# Patient Record
Sex: Female | Born: 1935 | Race: White | Hispanic: No | Marital: Married | Smoking: Former smoker
Health system: Southern US, Community
[De-identification: ages and names within clinical notes are randomized; demographics above are authoritative.]

## PROBLEM LIST (undated history)

## (undated) DIAGNOSIS — G35 Multiple sclerosis: Secondary | ICD-10-CM

## (undated) DIAGNOSIS — I5032 Chronic diastolic (congestive) heart failure: Secondary | ICD-10-CM

## (undated) DIAGNOSIS — K219 Gastro-esophageal reflux disease without esophagitis: Secondary | ICD-10-CM

## (undated) DIAGNOSIS — K5190 Ulcerative colitis, unspecified, without complications: Secondary | ICD-10-CM

## (undated) DIAGNOSIS — G709 Myoneural disorder, unspecified: Secondary | ICD-10-CM

## (undated) DIAGNOSIS — K529 Noninfective gastroenteritis and colitis, unspecified: Secondary | ICD-10-CM

## (undated) DIAGNOSIS — E785 Hyperlipidemia, unspecified: Secondary | ICD-10-CM

## (undated) DIAGNOSIS — E1165 Type 2 diabetes mellitus with hyperglycemia: Secondary | ICD-10-CM

## (undated) DIAGNOSIS — J189 Pneumonia, unspecified organism: Secondary | ICD-10-CM

## (undated) DIAGNOSIS — F419 Anxiety disorder, unspecified: Secondary | ICD-10-CM

## (undated) DIAGNOSIS — K922 Gastrointestinal hemorrhage, unspecified: Secondary | ICD-10-CM

## (undated) DIAGNOSIS — I10 Essential (primary) hypertension: Secondary | ICD-10-CM

## (undated) DIAGNOSIS — I214 Non-ST elevation (NSTEMI) myocardial infarction: Secondary | ICD-10-CM

## (undated) DIAGNOSIS — R262 Difficulty in walking, not elsewhere classified: Secondary | ICD-10-CM

## (undated) HISTORY — PX: TUBAL LIGATION: SHX77

## (undated) HISTORY — DX: Ulcerative colitis, unspecified, without complications: K51.90

## (undated) HISTORY — DX: Type 2 diabetes mellitus with hyperglycemia: E11.65

## (undated) HISTORY — DX: Gastrointestinal hemorrhage, unspecified: K92.2

## (undated) SURGERY — EGD (ESOPHAGOGASTRODUODENOSCOPY)
Anesthesia: Moderate Sedation

## (undated) SURGERY — COLONOSCOPY
Anesthesia: Moderate Sedation

---

## 1997-10-08 ENCOUNTER — Other Ambulatory Visit: Admission: RE | Admit: 1997-10-08 | Discharge: 1997-10-08 | Payer: Self-pay | Admitting: Gastroenterology

## 1998-01-19 ENCOUNTER — Other Ambulatory Visit: Admission: RE | Admit: 1998-01-19 | Discharge: 1998-01-19 | Payer: Self-pay | Admitting: Internal Medicine

## 1998-10-05 ENCOUNTER — Other Ambulatory Visit: Admission: RE | Admit: 1998-10-05 | Discharge: 1998-10-05 | Payer: Self-pay | Admitting: Obstetrics and Gynecology

## 2000-03-10 ENCOUNTER — Encounter: Admission: RE | Admit: 2000-03-10 | Discharge: 2000-03-10 | Payer: Self-pay | Admitting: Obstetrics and Gynecology

## 2000-03-10 ENCOUNTER — Encounter: Payer: Self-pay | Admitting: Obstetrics and Gynecology

## 2000-03-29 ENCOUNTER — Other Ambulatory Visit: Admission: RE | Admit: 2000-03-29 | Discharge: 2000-03-29 | Payer: Self-pay | Admitting: Obstetrics and Gynecology

## 2000-10-27 ENCOUNTER — Encounter: Payer: Self-pay | Admitting: Internal Medicine

## 2000-10-27 ENCOUNTER — Ambulatory Visit (HOSPITAL_COMMUNITY): Admission: RE | Admit: 2000-10-27 | Discharge: 2000-10-27 | Payer: Self-pay | Admitting: Internal Medicine

## 2001-08-21 ENCOUNTER — Encounter: Admission: RE | Admit: 2001-08-21 | Discharge: 2001-08-21 | Payer: Self-pay | Admitting: Obstetrics and Gynecology

## 2001-08-21 ENCOUNTER — Encounter: Payer: Self-pay | Admitting: Obstetrics and Gynecology

## 2001-10-16 ENCOUNTER — Ambulatory Visit (HOSPITAL_COMMUNITY): Admission: RE | Admit: 2001-10-16 | Discharge: 2001-10-16 | Payer: Self-pay | Admitting: Gastroenterology

## 2001-10-16 ENCOUNTER — Encounter (INDEPENDENT_AMBULATORY_CARE_PROVIDER_SITE_OTHER): Payer: Self-pay | Admitting: *Deleted

## 2001-10-31 ENCOUNTER — Other Ambulatory Visit: Admission: RE | Admit: 2001-10-31 | Discharge: 2001-10-31 | Payer: Self-pay | Admitting: Obstetrics and Gynecology

## 2002-08-20 ENCOUNTER — Encounter: Admission: RE | Admit: 2002-08-20 | Discharge: 2002-08-20 | Payer: Self-pay | Admitting: Obstetrics and Gynecology

## 2002-08-20 ENCOUNTER — Encounter: Payer: Self-pay | Admitting: Obstetrics and Gynecology

## 2003-03-18 ENCOUNTER — Encounter: Admission: RE | Admit: 2003-03-18 | Discharge: 2003-03-18 | Payer: Self-pay | Admitting: Neurology

## 2003-03-18 ENCOUNTER — Encounter: Payer: Self-pay | Admitting: Neurology

## 2003-04-02 ENCOUNTER — Encounter: Admission: RE | Admit: 2003-04-02 | Discharge: 2003-05-13 | Payer: Self-pay | Admitting: Neurology

## 2004-02-02 ENCOUNTER — Ambulatory Visit (HOSPITAL_COMMUNITY): Admission: RE | Admit: 2004-02-02 | Discharge: 2004-02-02 | Payer: Self-pay | Admitting: Obstetrics and Gynecology

## 2004-07-07 ENCOUNTER — Inpatient Hospital Stay (HOSPITAL_COMMUNITY): Admission: AD | Admit: 2004-07-07 | Discharge: 2004-07-12 | Payer: Self-pay | Admitting: Neurology

## 2004-12-28 ENCOUNTER — Encounter: Admission: RE | Admit: 2004-12-28 | Discharge: 2005-03-28 | Payer: Self-pay | Admitting: Neurology

## 2005-03-04 ENCOUNTER — Encounter: Admission: RE | Admit: 2005-03-04 | Discharge: 2005-03-04 | Payer: Self-pay | Admitting: Internal Medicine

## 2006-10-09 ENCOUNTER — Inpatient Hospital Stay (HOSPITAL_COMMUNITY): Admission: EM | Admit: 2006-10-09 | Discharge: 2006-10-20 | Payer: Self-pay | Admitting: Emergency Medicine

## 2006-10-16 ENCOUNTER — Encounter: Payer: Self-pay | Admitting: Vascular Surgery

## 2006-10-16 ENCOUNTER — Ambulatory Visit: Payer: Self-pay | Admitting: Vascular Surgery

## 2006-10-19 ENCOUNTER — Encounter: Payer: Self-pay | Admitting: Cardiology

## 2006-12-07 ENCOUNTER — Encounter (HOSPITAL_COMMUNITY): Admission: RE | Admit: 2006-12-07 | Discharge: 2006-12-08 | Payer: Self-pay | Admitting: Internal Medicine

## 2007-06-12 ENCOUNTER — Ambulatory Visit (HOSPITAL_COMMUNITY): Admission: RE | Admit: 2007-06-12 | Discharge: 2007-06-12 | Payer: Self-pay | Admitting: Neurology

## 2007-10-30 ENCOUNTER — Inpatient Hospital Stay (HOSPITAL_COMMUNITY): Admission: EM | Admit: 2007-10-30 | Discharge: 2007-11-02 | Payer: Self-pay | Admitting: Emergency Medicine

## 2008-01-01 ENCOUNTER — Inpatient Hospital Stay (HOSPITAL_COMMUNITY): Admission: EM | Admit: 2008-01-01 | Discharge: 2008-01-04 | Payer: Self-pay | Admitting: Emergency Medicine

## 2008-11-20 ENCOUNTER — Ambulatory Visit (HOSPITAL_COMMUNITY): Admission: RE | Admit: 2008-11-20 | Discharge: 2008-11-20 | Payer: Self-pay | Admitting: Internal Medicine

## 2009-09-01 ENCOUNTER — Inpatient Hospital Stay (HOSPITAL_COMMUNITY): Admission: EM | Admit: 2009-09-01 | Discharge: 2009-09-14 | Payer: Self-pay | Admitting: Emergency Medicine

## 2009-09-01 ENCOUNTER — Ambulatory Visit: Payer: Self-pay | Admitting: Pulmonary Disease

## 2009-09-01 ENCOUNTER — Ambulatory Visit: Payer: Self-pay | Admitting: Internal Medicine

## 2009-09-04 ENCOUNTER — Encounter (INDEPENDENT_AMBULATORY_CARE_PROVIDER_SITE_OTHER): Payer: Self-pay | Admitting: Pulmonary Disease

## 2009-09-05 ENCOUNTER — Ambulatory Visit: Payer: Self-pay | Admitting: Surgery

## 2009-09-05 ENCOUNTER — Encounter (INDEPENDENT_AMBULATORY_CARE_PROVIDER_SITE_OTHER): Payer: Self-pay | Admitting: Internal Medicine

## 2010-09-29 LAB — CARDIAC PANEL(CRET KIN+CKTOT+MB+TROPI)
CK, MB: 1.3 ng/mL (ref 0.3–4.0)
Relative Index: INVALID (ref 0.0–2.5)
Relative Index: INVALID (ref 0.0–2.5)
Total CK: 42 U/L (ref 7–177)
Total CK: 60 U/L (ref 7–177)
Troponin I: 0.02 ng/mL (ref 0.00–0.06)

## 2010-09-29 LAB — LEGIONELLA ANTIGEN, URINE

## 2010-09-29 LAB — GLUCOSE, CAPILLARY
Glucose-Capillary: 104 mg/dL — ABNORMAL HIGH (ref 70–99)
Glucose-Capillary: 113 mg/dL — ABNORMAL HIGH (ref 70–99)
Glucose-Capillary: 119 mg/dL — ABNORMAL HIGH (ref 70–99)
Glucose-Capillary: 120 mg/dL — ABNORMAL HIGH (ref 70–99)
Glucose-Capillary: 127 mg/dL — ABNORMAL HIGH (ref 70–99)
Glucose-Capillary: 130 mg/dL — ABNORMAL HIGH (ref 70–99)
Glucose-Capillary: 142 mg/dL — ABNORMAL HIGH (ref 70–99)
Glucose-Capillary: 142 mg/dL — ABNORMAL HIGH (ref 70–99)
Glucose-Capillary: 149 mg/dL — ABNORMAL HIGH (ref 70–99)
Glucose-Capillary: 153 mg/dL — ABNORMAL HIGH (ref 70–99)
Glucose-Capillary: 158 mg/dL — ABNORMAL HIGH (ref 70–99)
Glucose-Capillary: 218 mg/dL — ABNORMAL HIGH (ref 70–99)
Glucose-Capillary: 318 mg/dL — ABNORMAL HIGH (ref 70–99)
Glucose-Capillary: 85 mg/dL (ref 70–99)
Glucose-Capillary: 87 mg/dL (ref 70–99)
Glucose-Capillary: 91 mg/dL (ref 70–99)
Glucose-Capillary: 93 mg/dL (ref 70–99)
Glucose-Capillary: 93 mg/dL (ref 70–99)
Glucose-Capillary: 99 mg/dL (ref 70–99)

## 2010-09-29 LAB — DIFFERENTIAL
Basophils Absolute: 0.1 10*3/uL (ref 0.0–0.1)
Basophils Relative: 1 % (ref 0–1)
Eosinophils Absolute: 0 10*3/uL (ref 0.0–0.7)
Eosinophils Absolute: 0 10*3/uL (ref 0.0–0.7)
Eosinophils Relative: 0 % (ref 0–5)
Eosinophils Relative: 0 % (ref 0–5)
Lymphocytes Relative: 11 % — ABNORMAL LOW (ref 12–46)
Lymphocytes Relative: 4 % — ABNORMAL LOW (ref 12–46)
Lymphs Abs: 0.9 10*3/uL (ref 0.7–4.0)
Lymphs Abs: 2.1 10*3/uL (ref 0.7–4.0)
Monocytes Absolute: 1 10*3/uL (ref 0.1–1.0)
Monocytes Absolute: 1.6 10*3/uL — ABNORMAL HIGH (ref 0.1–1.0)
Monocytes Relative: 7 % (ref 3–12)
Monocytes Relative: 8 % (ref 3–12)
Neutrophils Relative %: 89 % — ABNORMAL HIGH (ref 43–77)

## 2010-09-29 LAB — CBC
HCT: 32.9 % — ABNORMAL LOW (ref 36.0–46.0)
HCT: 39.1 % (ref 36.0–46.0)
HCT: 43.7 % (ref 36.0–46.0)
Hemoglobin: 12.9 g/dL (ref 12.0–15.0)
Hemoglobin: 13.3 g/dL (ref 12.0–15.0)
Hemoglobin: 14 g/dL (ref 12.0–15.0)
Hemoglobin: 15 g/dL (ref 12.0–15.0)
MCHC: 33.5 g/dL (ref 30.0–36.0)
MCHC: 33.7 g/dL (ref 30.0–36.0)
MCHC: 34.3 g/dL (ref 30.0–36.0)
MCHC: 34.3 g/dL (ref 30.0–36.0)
MCV: 86.2 fL (ref 78.0–100.0)
MCV: 86.3 fL (ref 78.0–100.0)
MCV: 86.4 fL (ref 78.0–100.0)
MCV: 86.9 fL (ref 78.0–100.0)
MCV: 87.2 fL (ref 78.0–100.0)
MCV: 87.5 fL (ref 78.0–100.0)
Platelets: 192 10*3/uL (ref 150–400)
Platelets: 236 10*3/uL (ref 150–400)
Platelets: 243 10*3/uL (ref 150–400)
Platelets: 255 10*3/uL (ref 150–400)
RBC: 3.81 MIL/uL — ABNORMAL LOW (ref 3.87–5.11)
RBC: 4.41 MIL/uL (ref 3.87–5.11)
RBC: 4.43 MIL/uL (ref 3.87–5.11)
RDW: 14.1 % (ref 11.5–15.5)
RDW: 14.4 % (ref 11.5–15.5)
RDW: 14.4 % (ref 11.5–15.5)
WBC: 11.9 10*3/uL — ABNORMAL HIGH (ref 4.0–10.5)
WBC: 18.8 10*3/uL — ABNORMAL HIGH (ref 4.0–10.5)
WBC: 8.2 10*3/uL (ref 4.0–10.5)
WBC: 9.1 10*3/uL (ref 4.0–10.5)

## 2010-09-29 LAB — COMPREHENSIVE METABOLIC PANEL
ALT: 10 U/L (ref 0–35)
AST: 13 U/L (ref 0–37)
Albumin: 2.9 g/dL — ABNORMAL LOW (ref 3.5–5.2)
CO2: 24 mEq/L (ref 19–32)
Calcium: 9 mg/dL (ref 8.4–10.5)
Calcium: 9.1 mg/dL (ref 8.4–10.5)
Chloride: 108 mEq/L (ref 96–112)
Creatinine, Ser: 0.45 mg/dL (ref 0.4–1.2)
Creatinine, Ser: 0.62 mg/dL (ref 0.4–1.2)
GFR calc Af Amer: >60 mL/min (ref >60)
GFR calc Af Amer: >60 mL/min (ref >60)
GFR calc non Af Amer: >60 mL/min (ref >60)
Glucose, Bld: 273 mg/dL — ABNORMAL HIGH (ref 70–99)
Sodium: 138 mEq/L (ref 135–145)
Total Protein: 6.4 g/dL (ref 6.0–8.3)
Total Protein: 6.6 g/dL (ref 6.0–8.3)

## 2010-09-29 LAB — BLOOD GAS, ARTERIAL
Acid-Base Excess: 5.7 mmol/L — ABNORMAL HIGH (ref 0.0–2.0)
Bicarbonate: 28.8 mEq/L — ABNORMAL HIGH (ref 20.0–24.0)
Bicarbonate: 30.9 mEq/L — ABNORMAL HIGH (ref 20.0–24.0)
Patient temperature: 98.6
Patient temperature: 98.6
TCO2: 30.4 mmol/L (ref 0–100)
TCO2: 32.6 mmol/L (ref 0–100)
pCO2 arterial: 53 mmHg — ABNORMAL HIGH (ref 35.0–45.0)
pH, Arterial: 7.355 (ref 7.350–7.400)
pH, Arterial: 7.36 (ref 7.350–7.400)
pO2, Arterial: 71.6 mmHg — ABNORMAL LOW (ref 80.0–100.0)

## 2010-09-29 LAB — MAGNESIUM
Magnesium: 1.7 mg/dL (ref 1.5–2.5)
Magnesium: 1.7 mg/dL (ref 1.5–2.5)

## 2010-09-29 LAB — URINE CULTURE: Colony Count: NO GROWTH

## 2010-09-29 LAB — EXPECTORATED SPUTUM ASSESSMENT W GRAM STAIN, RFLX TO RESP C

## 2010-09-29 LAB — BASIC METABOLIC PANEL
BUN: 6 mg/dL (ref 6–23)
BUN: 8 mg/dL (ref 6–23)
BUN: 8 mg/dL (ref 6–23)
CO2: 23 mEq/L (ref 19–32)
CO2: 28 mEq/L (ref 19–32)
CO2: 29 mEq/L (ref 19–32)
CO2: 32 mEq/L (ref 19–32)
Calcium: 9.1 mg/dL (ref 8.4–10.5)
Calcium: 9.2 mg/dL (ref 8.4–10.5)
Chloride: 103 mEq/L (ref 96–112)
Chloride: 96 mEq/L (ref 96–112)
Chloride: 99 mEq/L (ref 96–112)
Chloride: 99 mEq/L (ref 96–112)
Creatinine, Ser: 0.46 mg/dL (ref 0.4–1.2)
Creatinine, Ser: 0.58 mg/dL (ref 0.4–1.2)
GFR calc Af Amer: >60 mL/min (ref >60)
GFR calc Af Amer: >60 mL/min (ref >60)
GFR calc non Af Amer: >60 mL/min (ref >60)
GFR calc non Af Amer: >60 mL/min (ref >60)
GFR calc non Af Amer: >60 mL/min (ref >60)
Glucose, Bld: 150 mg/dL — ABNORMAL HIGH (ref 70–99)
Glucose, Bld: 156 mg/dL — ABNORMAL HIGH (ref 70–99)
Glucose, Bld: 197 mg/dL — ABNORMAL HIGH (ref 70–99)
Potassium: 2.6 mEq/L — CL (ref 3.5–5.1)
Potassium: 3.4 mEq/L — ABNORMAL LOW (ref 3.5–5.1)
Potassium: 4.2 mEq/L (ref 3.5–5.1)
Potassium: 5.4 mEq/L — ABNORMAL HIGH (ref 3.5–5.1)
Sodium: 135 mEq/L (ref 135–145)
Sodium: 139 mEq/L (ref 135–145)
Sodium: 142 mEq/L (ref 135–145)

## 2010-09-29 LAB — URINALYSIS, MICROSCOPIC ONLY
Ketones, ur: 15 mg/dL — AB
Nitrite: NEGATIVE
Protein, ur: NEGATIVE mg/dL
Urobilinogen, UA: 1 mg/dL (ref 0.0–1.0)

## 2010-09-29 LAB — TROPONIN I: Troponin I: 0.02 ng/mL (ref 0.00–0.06)

## 2010-09-29 LAB — APTT: aPTT: 34 seconds (ref 24–37)

## 2010-09-29 LAB — CK TOTAL AND CKMB (NOT AT ARMC)
CK, MB: 0.8 ng/mL (ref 0.3–4.0)
Total CK: 23 U/L (ref 7–177)

## 2010-09-29 LAB — URINALYSIS, ROUTINE W REFLEX MICROSCOPIC
Glucose, UA: 100 mg/dL — AB
Protein, ur: NEGATIVE mg/dL
pH: 6 (ref 5.0–8.0)

## 2010-09-29 LAB — CULTURE, BLOOD (ROUTINE X 2)
Culture: NO GROWTH
Culture: NO GROWTH

## 2010-09-29 LAB — PROTIME-INR: INR: 1.05 (ref 0.00–1.49)

## 2010-09-29 LAB — URINE MICROSCOPIC-ADD ON

## 2010-09-29 LAB — LACTIC ACID, PLASMA: Lactic Acid, Venous: 1.5 mmol/L (ref 0.5–2.2)

## 2010-09-29 LAB — PHOSPHORUS: Phosphorus: 4 mg/dL (ref 2.3–4.6)

## 2010-09-29 LAB — STREP PNEUMONIAE URINARY ANTIGEN: Strep Pneumo Urinary Antigen: NEGATIVE

## 2010-09-29 LAB — MRSA PCR SCREENING: MRSA by PCR: NEGATIVE

## 2010-10-03 LAB — BASIC METABOLIC PANEL
BUN: 4 mg/dL — ABNORMAL LOW (ref 6–23)
BUN: 5 mg/dL — ABNORMAL LOW (ref 6–23)
BUN: 6 mg/dL (ref 6–23)
CO2: 29 mEq/L (ref 19–32)
CO2: 34 mEq/L — ABNORMAL HIGH (ref 19–32)
Calcium: 9.6 mg/dL (ref 8.4–10.5)
Chloride: 101 mEq/L (ref 96–112)
Chloride: 104 mEq/L (ref 96–112)
Creatinine, Ser: 0.4 mg/dL (ref 0.4–1.2)
Creatinine, Ser: 0.43 mg/dL (ref 0.4–1.2)
Creatinine, Ser: 0.43 mg/dL (ref 0.4–1.2)
Creatinine, Ser: 0.52 mg/dL (ref 0.4–1.2)
GFR calc Af Amer: >60 mL/min (ref >60)
GFR calc Af Amer: >60 mL/min (ref >60)
GFR calc non Af Amer: >60 mL/min (ref >60)
GFR calc non Af Amer: >60 mL/min (ref >60)
Glucose, Bld: 108 mg/dL — ABNORMAL HIGH (ref 70–99)
Glucose, Bld: 113 mg/dL — ABNORMAL HIGH (ref 70–99)
Potassium: 3.2 mEq/L — ABNORMAL LOW (ref 3.5–5.1)
Potassium: 3.3 mEq/L — ABNORMAL LOW (ref 3.5–5.1)
Potassium: 3.8 mEq/L (ref 3.5–5.1)

## 2010-10-03 LAB — GLUCOSE, CAPILLARY
Glucose-Capillary: 102 mg/dL — ABNORMAL HIGH (ref 70–99)
Glucose-Capillary: 108 mg/dL — ABNORMAL HIGH (ref 70–99)
Glucose-Capillary: 110 mg/dL — ABNORMAL HIGH (ref 70–99)
Glucose-Capillary: 118 mg/dL — ABNORMAL HIGH (ref 70–99)
Glucose-Capillary: 121 mg/dL — ABNORMAL HIGH (ref 70–99)
Glucose-Capillary: 122 mg/dL — ABNORMAL HIGH (ref 70–99)
Glucose-Capillary: 131 mg/dL — ABNORMAL HIGH (ref 70–99)
Glucose-Capillary: 133 mg/dL — ABNORMAL HIGH (ref 70–99)
Glucose-Capillary: 134 mg/dL — ABNORMAL HIGH (ref 70–99)
Glucose-Capillary: 134 mg/dL — ABNORMAL HIGH (ref 70–99)
Glucose-Capillary: 136 mg/dL — ABNORMAL HIGH (ref 70–99)
Glucose-Capillary: 141 mg/dL — ABNORMAL HIGH (ref 70–99)
Glucose-Capillary: 152 mg/dL — ABNORMAL HIGH (ref 70–99)
Glucose-Capillary: 160 mg/dL — ABNORMAL HIGH (ref 70–99)
Glucose-Capillary: 168 mg/dL — ABNORMAL HIGH (ref 70–99)
Glucose-Capillary: 188 mg/dL — ABNORMAL HIGH (ref 70–99)
Glucose-Capillary: 95 mg/dL (ref 70–99)

## 2010-10-03 LAB — CBC
HCT: 32.4 % — ABNORMAL LOW (ref 36.0–46.0)
Platelets: 325 10*3/uL (ref 150–400)
RBC: 3.74 MIL/uL — ABNORMAL LOW (ref 3.87–5.11)
WBC: 6.3 10*3/uL (ref 4.0–10.5)

## 2010-10-03 LAB — MAGNESIUM: Magnesium: 1.9 mg/dL (ref 1.5–2.5)

## 2010-11-23 NOTE — H&P (Signed)
[name], [name] NO.:  [id number]   MEDICAL RECORD NO.:  [id number]          PATIENT TYPE:  INP   LOCATION:  1309                         FACILITY:  [facility]   PHYSICIAN:  Lucita Ferrara, MD         DATE OF BIRTH:  1934-09-14   DATE OF ADMISSION:  10/29/2007  DATE OF DISCHARGE:                              HISTORY & PHYSICAL   HISTORY OF PRESENT ILLNESS:  The patient is a 76 year old female who  presents to [facility] with multiple different  complaints including shortness of breath, weakness, inability to  ambulate and decreased p.o. intake for the last 2-3 days.  Of note, the  patient has known multiple sclerosis and she is currently on active  treatment with Betaseron and sees Dr. Sandria Manly for this purpose. Baseline  neurological status: she is wheelchair bound and cannot perform any of  her own ADL's. She denies any change in neurological status or new  deficits.  She also denies any urinary frequency, urgency or hesitancy.  Otherwise, review of systems negative.  She denies any fevers, chills,  chest pain.   In addition to the above, her three daughters are in the room, and what  they tell me is that the patient lives with her husband.  She no longer  can take care of her activities of daily living, and her husband can no  longer take care of her.  She has been getting progressively weaker and  they are interested in placement.   PAST MEDICAL HISTORY:  1. Diabetes type 2.  2  Hypercholesteremia.  1. Multiple sclerosis diagnosed 1984.  2. History of previous urinary tract infections.   PAST SURGICAL HISTORY:  None   SOCIAL HISTORY:  The patient denies drugs or alcohol.  Lives with her  husband.  She is a retired Artist.  She quit smoking about 12  years ago.  She has smoked one and a half packs for a number of years.   CURRENT MEDICATIONS:  1. Aspirin.  2. Atenolol 50 mg p.o. daily.  3. Betaseron 0.5 mg special dosing.  4.  Glyburide 5 mg t.i.d.  5. Lipitor 80 mg daily.  6. Metformin 1 gram b.i.d.  7. Vitamin D 6000 mg once daily.  8. Xanax 0.5 mg as needed.   ALLERGIES:  NO KNOWN DRUG ALLERGIES   PHYSICAL EXAMINATION:  GENERAL:  The patient is an elderly, frail female  in no acute distress.  She has a Ventimask on.  VITAL SIGNS:  Blood pressure is 102/64, pulse is 98, respirations 22,  pulse ox 94% on 50% Ventimask.  HEENT:  Normocephalic, atraumatic.  Sclerae anicteric.  NECK:  Supple.  No JVD or carotid bruits.  CHEST:  Muscles are intact.  CARDIOVASCULAR:  S1-S2, regular rate and rhythm.  No murmurs, rubs,  clicks.  ABDOMEN:  Soft, nontender, nondistended.  Positive bowel sounds.  LUNGS:  Clear to auscultation bilaterally.  No rhonchi, rales or  wheezes.  EXTREMITIES: No clubbing, cyanosis or edema.   LABORATORY DATA:  The patient had a  lipase 15.  Complete metabolic panel  shows mildly low potassium of 5.1, glucose 134, BUN 20, creatinine 0.87,  albumin 3.0.  UA:  Trace leukocyte esterase.  INR is 1.1, D-dimer  moderately high at 1.28.  first set of cardiac enzymes negative.  CBC  shows a white count of 12.6, hemoglobin 14.4, hematocrit 42.7, slight  monocytosis of 17.  CT angio shows no acute pulmonary embolism.  Chest x-  ray shows low volume and tone with bilateral lower lung atelectasis and  airspace disease, chronic mild peribronchial thickening.  CT of the  head.  Note, CT evidence of acute intracranial abnormalities.   ASSESSMENT/PLAN:  A 76 year old with known multiple sclerosis,  wheelchair bound, here with progressive weakness, shortness of breath  and decreased p.o. intake and diarrhea.  1. Weakness, question if this is MS exacerbation, unlikely.  The      patient states that she recently had an MRI at the outpatient      facility, ordered by Dr. Sandria Manly in the last three months which showed      little change.  I doubt that I need to repeat MRI at this point.      The patient has no  focal neurological deficits or change in      neurological deficits.  Plan:  Will need to reconsult Neurology      (Dr. Sandria Manly).  Continue Betaseron, until otherwise stated by      neurology.  Will empirically start the patient on Solu-Medrol 60 mg      IV q.6 h x 1 day.  2. Shortness of breath with mild wheeze.  CT angio was negative for      pulmonary embolism but shows mild pulmonary edema.  Although, her      beta natruretic peptide is 45.9, inconsistent with CHF      exacerbation, we will continue nebulizer treatments.  I will give      her 40 mg of Lasix times one.  3. Urinalysis.  Positive for leukocyte esterase.  Will send for      cultures and empirically treat with Levaquin.  4. Placement.  Family is requesting nursing home placement given the      patient's chronic progressive decline, deconditioning.  The patient      is wheelchair-bound, and it seems like her husband is required to      perform all of her activities of living.  I had a lengthy      discussion with the three daughters that are at her bedside, and      they requested that for the patient's safety, the patient will need      nursing home placement.  In this regard will get progression and      social work involved.  5. Diarrhea.  Will send stool for C diff and empirically start with      metronidazole.      Lucita Ferrara, MD  Electronically Signed     RR/MEDQ  D:  10/30/2007  T:  10/30/2007  Job:  [id number]

## 2010-11-23 NOTE — Discharge Summary (Signed)
[name], Heath                   ACCOUNT NO.:  [id number]   MEDICAL RECORD NO.:  [id number]          PATIENT TYPE:  INP   LOCATION:  1309                         FACILITY:  [facility]   PHYSICIAN:  Madaline Savage, MD        DATE OF BIRTH:  04-08-1935   DATE OF ADMISSION:  10/29/2007  DATE OF DISCHARGE:  11/02/2007                               DISCHARGE SUMMARY   PRIMARY CARE PHYSICIAN:  Lucky Cowboy, M.D.   HOSPITAL CONSULTATIONS:  Was seen by Dr. Sandria Manly from neurology.   DISCHARGE DIAGNOSES:  1. Clostridium difficile colitis.  2. Multiple sclerosis with quadriparesis.  3. Hypoglycemic episodes.  4. Diabetes mellitus.  5. Hypertension.   DISCHARGE MEDICATIONS:  1. Flagyl 500 mg 3 times daily for 10 more days.  2. Atenolol 50 mg once daily.  3. Betazole 0.5 mg as before.  4. Lipitor 80 mg daily.  5. Vitamin D 1000 mg once daily.  6. Xanax 0.5 mg as needed.  7. Aspirin 81 mg daily.   HOSPITAL PROCEDURES:  1. She had an x-ray of her lumbar spine done on November 03, 2007, which      showed mild compression deformity of the superior endplate of L1      which is likely old.  2. She had a thoracic spine x-ray done on October 30, 2007, which showed      negative study.   HISTORY OF PRESENT ILLNESS:  For full history and physical see the  history and physical dictated by Dr. Flonnie Overman on October 29, 2007.  In short,  [name] is a 76 year old lady with a history of multiple sclerosis  diagnosed in 1984 who comes in with some shortness of breath, weakness  and decreased p.o. intake and diarrhea.  She was admitted with a  diagnosis of worsening of her multiple sclerosis and neurology was  consulted.   PROBLEM LIST:  1. C. difficile colitis.  [name] was having diarrhea on admission      and we did send stool for C. difficile which came back positive.      She was empirically started on Flagyl with remarkable improvement.      Her diarrhea has stopped at this time and her p.o. intake is  somewhat improved.  We will complete a 14 day course of treatment      with Flagyl at this time.  2. Hypoglycemia episodes.  [name] was initially started on steroids      and at that time Lantus was put on board because of high blood      sugars.  Since then she has had episodes of hypoglycemia.  Her      Lantus was first held but she continued to have hypoglycemic      episodes, so at this time we have stopped her glyburide and      metformin.  I suspect her hypoglycemia is secondary to poor p.o.      intake.  We have requested her to check her blood sugars and if the  blood sugars are consistently above 200 I have asked her to call      her primary care doctor and restart her p.o. hypoglycemic meds.  3. Multiple sclerosis.  She does have weakness which is progressively      getting worse.  She was seen by Dr. Sandria Manly from neurology.  At this      time we will continue to have physical therapy and occupational      therapy at home.  We will also recommend speech therapy to follow      at home.  The occupational therapy did get an elbow splint for her      while she was in the hospital.   DISPOSITION:  She is now being discharged home in a stable condition.  We have offered a skilled nursing home placement but the patient  declined.  She will be discharged home with home health PT, OT and  speech therapy.   FOLLOWUP:  She is asked to follow up with Dr. Lucky Cowboy in  approximately 1-2 weeks.  She is also to keep her appointment with Dr.  Sandria Manly.      Madaline Savage, MD  Electronically Signed     PKN/MEDQ  D:  11/02/2007  T:  11/02/2007  Job:  [id number]

## 2010-11-23 NOTE — Consult Note (Signed)
[name], [name]                   ACCOUNT NO.:  [id number]   MEDICAL RECORD NO.:  [id number]          PATIENT TYPE:  INP   LOCATION:  1309                         FACILITY:  [facility]   PHYSICIAN:  Genene Churn. Love, M.D.    DATE OF BIRTH:  [birth year]   DATE OF CONSULTATION:  10/30/2007  DATE OF DISCHARGE:                                 CONSULTATION   This 76 year old right-handed white married female from [place],  [place] is admitted for evaluation of multiple complaints.   HISTORY OF PRESENT ILLNESS:  [name] has a 25-year history of multiple  sclerosis with marked worsening over the last year.  She was last able  to stand for as long as 3 minutes in May of 2008.  Since that time,  however, she has been wheelchair bound with right greater than left  hemiparesis and progressive weakness on the left side as well.  This is  despite the use of intermittent IV Solu-Medrol, prednisone and  Betaseron.  [name] has been discussed with the patient but she did not  wish to use that medication or Novantrone.  She is currently wheelchair-  bound and cared for by her husband.  She requires assistance in all  activities of daily living.  She was last seen by me September 19, 2007.  Yesterday she developed difficulty with lower back pain not radiating  into her legs, shortness of breath, and had nausea and vomiting without  associated fever, chills or dysuria.  She has had no increased frequency  in her urinary symptoms.  She has not had any other change in her  neurologic status.  She denies double vision and she denies Lhermitte's  sign, chest pain or palpitations.  She has had no falls.  She has not  been on steroids in over 6 months.   HOME MEDICATIONS:  1. Xanax 0.5 mg p.r.n.  2. Vitamin D.  3. Metformin 1 gram b.i.d.  4. Glyburide 5 mg t.i.d.  5. Atenolol 50 mg daily.  6. Lipitor 80 mg daily.  7. Tramadol 50 mg as needed.  8. Aspirin 81 mg daily.  9. Betaseron 0.3 mg  subcutaneously every other day.   She has had not had any other new neurologic symptomatology.  She  arrived in the emergency room where white blood cell count was 12,600,  hemoglobin was 14.4, hematocrit was 42.7.  Platelet count was 220K.  Initial myoglobin was 154 which was normal. CK-MB less than 1.0.  Troponin less than 0.05, INR 1.1.  CT scan showed no evidence of an  acute abnormality of the brain.  There was evidence of periventricular  white matter disease.  Her CT angiogram to the chest to rule out  pulmonary emboli was unremarkable.  There was low lung volumes and  congestion  showing evidence of mild edema.  A routine chest x-ray showed low volume  with bilateral low lung atelectasis airspace disease.  Her 12-lead EKG  was not in the chart.   EXAMINATION:  Revealed a well-developed white female.  Blood pressure  right arm 105/60, left  arm 110/60, heart rate was 96, temperature was  97.9, O2 sat was 96.9 on room air.  She was alert and oriented x3.  She  had a weak voice.  Cranial nerve examination revealed visual fields to  be full.  The extraocular movements were full.  The disks were flat.  The face was symmetric.  The tongue was midline.  The uvula was midline  and gags were present.  She had almost no strength in her right hand and  arm.  Still able to move the fingers of her right hand.  She was 0/5 in  the right leg.  She could move the big toe of her left foot and she was  3/5 in the left arm with 2+ reflexes in the upper and lower extremities,  upgoing plantar responses bilaterally.  Pinprick was felt well in all  extremities.  There was no evidence of a sensory level.   IMPRESSION:  1. Worsening quadriplegia-code 344.00.  2. Multiple sclerosis-code 340.  3. Low back pain-code 724.4.  4. Shortness of breath, code unknown.  5. Diabetes mellitus-code 250.60.   PLAN:  At this time is to obtain a swallowing study and x-rays of the  thoracic and lumbar spine.  At  this point I am concerned the patient may  need nursing home placement.           ______________________________  Genene Churn. Sandria Manly, M.D.     JML/MEDQ  D:  10/30/2007  T:  10/30/2007  Job:  [id number]   cc:   Lucky Cowboy, M.D.  Fax: [phone]

## 2010-11-23 NOTE — Discharge Summary (Signed)
[name], [name]                   ACCOUNT NO.:  [id number]   MEDICAL RECORD NO.:  [id number]          PATIENT TYPE:  INP   LOCATION:  5522                         FACILITY:  [facility]   PHYSICIAN:  Hillery Aldo, M.D.   DATE OF BIRTH:  1934-08-07   DATE OF ADMISSION:  01/01/2008  DATE OF DISCHARGE:  01/04/2008                               DISCHARGE SUMMARY   PRIMARY CARE PHYSICIAN:  Dr. Oneta Rack.   NEUROLOGIST:  Dr. Avie Echevaria.   DISCHARGE DIAGNOSES:  1. Pyelonephritis with nausea and vomiting.  2. Possible recurrent Clostridium difficile colitis, toxin negative.  3. Profound hypokalemia secondary to gastrointestinal losses.  4. Multiple sclerosis with quadriparesis.  5. Dysphasia.  6. Gram-negative rod urinary tract infection.  7. Hypertension.  8. Dyslipidemia.  9. Diabetes type 2.  10.Stage II sacral decubitus ulcer.   DISCHARGE MEDICATIONS:  1. Lipitor 80 mg daily.  2. Xanax 0.5 mg t.i.d. p.r.n.  3. Vitamin D 400 mg b.i.d.  4. Metformin 1 g b.i.d.  5. Glyburide 5 mg t.i.d.  6. Atenolol 50 mg daily.  7. Aspirin 81 mg daily.  8. Betaseron 0.5 mg subcutaneous every other day.  9. Flagyl 500 mg t.i.d. x10 days.  10.Ceftin 500 mg b.i.d. for 5 days.  11.Florastor 250 mg b.i.d. x2 weeks.  12.Potassium chloride 20 mEq b.i.d. until diarrhea resolves      completely.   CONSULTATIONS:  None.   BRIEF ADMISSION HISTORY OF PRESENT ILLNESS:  The patient is a 76-year-  old female who presented to the hospital at the behest of her primary  care physician secondary to hypokalemia.  She also endorsed generalized  weakness, back as well as arm pain, anorexia, dysphasia as well as  abdominal pain.  She was admitted for treatment of her hypokalemia and  further evaluation and workup.  For the full details, please see the  dictated report done by Dr. Roxan Hockey.   PROCEDURES AND DIAGNOSTIC STUDIES:  Swallowing function test done on  January 02, 2008, showed dysphagia with no overt  indication of aspiration.  Recommendations were to continue a dysphagia III diet with honey-  thickened liquids by teaspoon only.   DISCHARGE LABORATORY DATA:  Sodium was 146, potassium 4.7, chloride 112,  bicarb 29, BUN 1, creatinine 0.43, and glucose 110.  White blood cell  count was 5.2, hemoglobin 11.6, hematocrit 34.1, and platelets 298.   HOSPITAL COURSE:  1. Generalized weakness secondary to hypokalemia, deconditioning,      underlying multiple sclerosis:  The patient was admitted and was      found to have copious diarrhea.  Her hypokalemia was likely due to      GI losses.  Her potassium was aggressively repleted.  Diarrhea was      addressed.  Her underlying weakness is due to multiple sclerosis,      and she should follow up with Dr. Sandria Manly for treatment of this as an      outpatient.  2. Pyelonephritis/gram-negative rod urinary tract infection:  The      patient has a history of recurrent urinary tract  infections, and      the urinalysis revealed pyuria and bacteriuria.  Cultures are      growing out 100,000 colonies of gram-negative rods, speciation      pending.  The patient has completed 2 days of treatment with      Rocephin.  We will discharge her on an additional 5 days of      treatment with Ceftin.  3. Diarrhea:  The patient's diarrhea was felt to be due to possibly      partially treated Clostridium difficile.  C. difficile toxin      studies were negative x2.  However, the patient improved with      empiric treatment with Flagyl.  She is currently on day 2 out of a      planned course of 12 days of therapy.  She was also put on      Florastor.  4. Dysphagia:  The patient was seen in consultation with speech      therapist.  Recommendations were for a dysphagia III with chopped-      meat diet and honey-thickened liquids.  She is to have full      supervision at meals.  5. Hypertension:  The patient's blood pressure has been reasonably      controlled.  We will  discharge her on her usual regimen which was      temporarily held due to her gastrointestinal upset.  6. Dyslipidemia:  The patient's statin was held during the course of      her hospitalization due to gastrointestinal upset.  She can resume      this at discharge.  7. Diabetes:  The patient has been managed with sliding scale insulin.      She can resume her home oral hypoglycemics at discharge.  8. Stage II sacral decubitus:  The patient was put on a Mepilex Border      and provided with wound care per the nursing staff.   DISPOSITION:  The patient is medically stable for discharge.  She is  instructed to follow up with Dr. Oneta Rack and with Dr. Sandria Manly.  Home health  nursing services will be set up for the patient along with home health  aide and physical therapy at discharge.      Hillery Aldo, M.D.  Electronically Signed     CR/MEDQ  D:  01/04/2008  T:  01/05/2008  Job:  [id number]   cc:   Lucky Cowboy, M.D.  Genene Churn. Love, M.D.

## 2010-11-23 NOTE — H&P (Signed)
NAMEJAHNIYAH, [name] NO.:  [id number]   MEDICAL RECORD NO.:  [id number]          PATIENT TYPE:  EMS   LOCATION:  MAJO                         FACILITY:  [facility]   PHYSICIAN:  Michaelyn Barter, M.D. DATE OF BIRTH:  October 20, 1934   DATE OF ADMISSION:  01/01/2008  DATE OF DISCHARGE:                              HISTORY & PHYSICAL   NEUROLOGIST:  Dr. Sandria Manly.   PRIMARY CARE DOCTOR:  Dr. Milinda Cave.   CHIEF COMPLAINT:  Low potassium level.   HISTORY OF PRESENT ILLNESS:  [name] is a 76 year old female who  suffers from advanced multiple sclerosis.  When initially asked why she  was here, she stated that she did not know.  Her husband [name] gave the  history.  He indicated that the patient's medical condition has been  going downhill for quite some time secondary to the advanced nature of  her multiple sclerosis.  Recently, she has been suffering from back, as  well as arm pain associated with her illness.  He also states that over  the past 2 days, the patient has eaten little to nothing.  She has also  developed some difficulty swallowing during which time she regurgitates  her fluids immediately after swallowing them.  She has been becoming  progressively sicker over the past 1 week.  She complained of some  abdominal pain, as well as diarrhea.  Approximately 2 weeks ago, the  patient was started on a trial of steroids.  These initially seemed to  help, but then the patient relapsed immediately to her previous weak  baseline.  The patient requires total care at her baseline.  The  patient's husband indicates that he has to help her with all her ADLs,  and she cannot do anything for herself.  He also indicates that he took  her to see Dr. Milinda Cave yesterday secondary to the back pain and arm  pain, at which time some routine blood work was completed.  This  morning, he was called and told that the patient's potassium level was  low and because of the low potassium, she was  brought to the hospital  for replacement.   PAST MEDICAL HISTORY:  1. Significant for multiple sclerosis with quadriparesis, diagnosed in      1984.  2. Clostridium difficile colitis.  3. Diabetes mellitus.  4. Hypertension.  5. Hypercholesterolemia.  6. Urinary tract infections.  7. Community-acquired pneumonia.  8. E-coli UTI.  9. Acute otitis media.  10.Deconditioning.  11.Internal and external hemorrhoids.   CURRENT HOME MEDICATIONS:  1. Aspirin.  2. Atenolol 50 mg daily.  3. Betaseron 0.5 mg.  4. Glyburide 5 mg t.i.d.  5. Lipitor 80 mg daily.  6. Metformin 1 gm b.i.d.  7. Vitamin D.  8. Xanax.   SOCIAL HISTORY:  Alcohol and cigarettes have been denied in the past by  the patient.   FAMILY HISTORY:  The patient's mother died at age of 50 secondary to  CHF.  Father died at age 95 from prostate cancer.   REVIEW OF SYSTEMS:  As per HPI, otherwise all  other systems are  negative.   PHYSICAL EXAMINATION:  GENERAL:  The patient is awake.  She is  cooperative.  She is in no obvious distress.  She has episodes of  confusion.  VITAL SIGNS:  Temperature is 96.7, blood pressure 88/53, heart rate 75,  respirations 16.  O2 sat 93% on room air.  HEENT:  She is to normocephalic, atraumatic.  Anicteric.  Extraocular  movements are intact.  Pupils have some slight decreased reactivity to  light bilaterally.  Oral mucosa is dry.  No thrush or exudates.  NECK:  Supple.  No lymphadenopathy, no JVD, no thyromegaly.  CARDIAC:  S1-S2 present.  Regular rate and rhythm.  RESPIRATORY:  No crackles or wheezes.  ABDOMEN:  Flat, soft, nontender, nondistended.  Bowel sounds are intact.  EXTREMITIES:  No leg edema.  NEUROLOGICAL:  The patient is oriented to self only.  With regards to  the month, she stated the month was April, the year was 2009.  MUSCULOSKELETAL:  Very limited.  The patient is not able to move her  legs against or oppose gravity.  Likewise, she had difficulties moving  her  right arm.  She could and although she was able to move her left arm  against gravity, the range of motion was very limited.  Grip strength  was intact bilaterally, although it was significantly decreased.   LABORATORY DATA:  Hemoglobin is 11.9, hematocrit is 35.0.  Sodium 139,  potassium 2.1, chloride 98, glucose 279, BUN 11, creatinine 0.7.   ASSESSMENT/PLAN:  1. Severe hypokalemia.  Will start the patient on IV potassium      supplementation for now.  2. Dysphagia.  I suspect that this is most likely secondary to a      further progression of the patient's already advanced multiple      sclerosis.  It appears the patient may either have end-stage      multiple sclerosis or is approaching end-stage multiple sclerosis.      Will request a swallow evaluation for now.  Will also consider      consultation with neurology.  3. Failure to thrive.  Again, the advanced degree of the patient's      multiple sclerosis is most likely contributing to this.  Will also      attempt to identify any reversible causes and treat.  4. Advanced multiple sclerosis.  Again, will consider consultation      with neurology.  5. Diabetes mellitus.  Will initiate Accu-Cheks, as well as sliding      scale insulin for now in light of the patient's oral intake being      significantly limited.  Will resume her antidiabetic medications      pending the patient's Accu-      Cheks.  6. Gastrointestinal prophylaxis.  Will provide Protonix.  7. Deep venous thrombosis prophylaxis.  Will provide Lovenox.      Michaelyn Barter, M.D.  Electronically Signed     OR/MEDQ  D:  01/01/2008  T:  01/01/2008  Job:  [id number]

## 2010-11-26 NOTE — H&P (Signed)
[name], [name] NO.:  [id number]   MEDICAL RECORD NO.:  [id number]          PATIENT TYPE:  EMS   LOCATION:  MAJO                         FACILITY:  [facility]   PHYSICIAN:  Hillery Aldo, M.D.   DATE OF BIRTH:  03-Dec-1934   DATE OF ADMISSION:  10/09/2006  DATE OF DISCHARGE:                              HISTORY & PHYSICAL   PRIMARY CARE PHYSICIAN:  Lucky Cowboy, M.D.   CHIEF COMPLAINT:  Weakness, cough.   HISTORY OF PRESENT ILLNESS:  The patient is a 76 year old female with  past medical history of multiple sclerosis who presents with a 7-day  history of progressive weakness following an upper respiratory  infection.  The patient denies any subjective fever or chills.  She has  not had any changes in appetite.  She does report dyspnea for one weeks'  time with cough, mostly productive of white sputum.  She states  initially she was able to expectorate sputum, but lately she has not  been able to cough anything up.  She denies any dysuria but has had back  pain which is in the middle of her lower back.  No nausea, vomiting or  diarrhea.  On initial evaluation in emergency department, she has  significant pyuria and bacteriuria and is being admitted for infectious  workup.   PAST MEDICAL HISTORY:  1. Multiple sclerosis diagnosed in 1984.  2. Diabetes.  3. Hypertension.  4. Obesity.  5. History of dyslipidemia.  6. History of internal and external hemorrhoids.  7. History of colitis in 2003.   FAMILY HISTORY:  The patient's mother died at 69 from congestive heart  failure.  Father died at 50 from prostate cancer.  She has 18 siblings,  several of whom died with various cancers.  She has four healthy  children.   SOCIAL HISTORY:  The patient is married and lives with her husband.  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  She quit smoking approximately 11 years  ago.  Prior to that, she had a 1-1/2- pack-per-day habit.  She denies  any alcohol or drug  use.   ALLERGIES:  None.   CURRENT MEDICATIONS:  1. Atenolol, dosage uncertain.  2. Glyburide, dosage uncertain.  3. Metformin 500 mg q.p.m.  4. Zetia 10 mg daily.   REVIEW OF SYSTEMS:  As noted in the elements of the HPI above.  Otherwise negative.   PHYSICAL EXAMINATION:  VITAL SIGNS:  Temperature 99.1, pulse 115,  respirations 18, blood pressure 97/60, O2 saturation 91% on 2 liters.  GENERAL:  Well-developed, well-nourished female in no acute distress.  HEENT:  Normocephalic, atraumatic.  PERRL  EOMI.  Oropharynx reveals dry  mucous membranes.  Otherwise clear.  NECK:  Supple, no thyromegaly, no lymphadenopathy, no jugular venous  distension.  CHEST:  The patient has rhonchi that clear with cough.  No rales.  HEART:  Tachycardiac rate, regular rhythm.  No murmurs, rubs, or  gallops.  ABDOMEN:  Soft, nontender, nondistended with normoactive bowel sounds.  EXTREMITIES:  No clubbing, edema, cyanosis.  SKIN:  Warm and dry.  No  rashes.  NEUROLOGIC:  The patient is alert and oriented x3.  Cranial nerves II-  XII grossly intact.  Nonfocal.   DATA REVIEW.:  Chest x-ray shows low lung volumes with bibasilar  atelectasis.   Laboratory data:  BNP is less than 30.  Point-of-care markers are  negative with the exception of an elevated myoglobin.  Sodium is 138,  potassium 3.7, chloride 102, bicarb 26, BUN 14, creatinine 0.62, glucose  198.  White blood cell count is 12.7, hemoglobin 14.4, hematocrit 43.2,  platelets 174.  Urinalysis is positive for nitrites and a large amount  of leukocyte esterase.  There are 21-50 white blood cells with clumping  noted, 3-6 red blood cells and many bacteria.  Influenza A and B studies  are negative.   ASSESSMENT/PLAN:  1. Community-acquired pneumonia with hypoxia:  We will admit the      patient and start her empirically on Rocephin and azithromycin.      Will attempt to obtain sputum Gram stain and culture as well as      blood cultures x2.   Will monitor her clinical course closely and      put her on Mucinex to help break up her chest congestion.  Use      Robitussin p.r.n..  2. Urinary tract infection or pyelonephritis:  The patient certainly      has a clinical concern for pyelonephritis given her systemic      illness and back pain.  The Rocephin should adequately cover      urinary pathogens.  We will send her urine for culture.  3. Diabetes:  Will check the patient's glucoses before meals and at      bedtime and cover her with sliding scale insulin.  Will continue      her metformin and start her on 2.5 mg glyburide.  If her dosage is      higher than this, we will amend it once her medication list is      ascertained.  4. Hypertension:  The patient is actually hypotensive.  We will      administer IV fluids given her dry mucous membranes and clinical      concern for some mild volume depletion.  5. Tachycardia:  The patient has a history of tachycardia for which      she is on beta blocker therapy.  Once her dosages are ascertained,      we can continue this..  6. Dyslipidemia:  Continue the patient's Zetia at 10 mg daily.  7. Prophylaxis:  Initiate GI prophylaxis with Protonix and DVT      prophylaxis with Lovenox.      Hillery Aldo, M.D.  Electronically Signed     CR/MEDQ  D:  10/09/2006  T:  10/09/2006  Job:  [id number]   cc:   Lucky Cowboy, M.D.

## 2010-11-26 NOTE — Procedures (Signed)
[facility]  Patient:    RUSSIA, SCHEIDERER Visit Number: [id number] MRN: [id number]          Service Type: END Location: ENDO Attending Physician:  Orland Mustard Dictated by:   Llana Aliment. Randa Evens, M.D. Proc. Date: 10/16/01 Admit Date:  10/16/2001 Discharge Date: 10/16/2001   CC:         Marinus Maw, M.D.   Procedure Report  DATE OF BIRTH:  06/15/1935  PROCEDURE PERFORMED:  Colonoscopy and biopsy.  ENDOSCOPIST:  Llana Aliment. Randa Evens, M.D.  MEDICATIONS USED:  Fentanyl 50 mcg and Versed 5 mg IV.  INSTRUMENT:  Olympus pediatric video colonoscope.  INDICATIONS:  Rectal bleeding in a 76 year old woman who also has diarrhea.  A number of years ago we saw her and she had acute colitis with biopsies suggesting ulcerative colitis. She was treated and subsequent sigmoidoscopy with biopsies showed essentially normal colon.  So it was felt that she may have had an infectious colitis and not true ulcerative colitis.  She has done well for several years until recently when she began to have progressively looser stools with blood and cramping and some weight loss.  She is not having nocturnal stool.  She has seen some bright blood associated with her bowel movements.  DESCRIPTION OF PROCEDURE:  The procedure had been explained to the patient and consent obtained.  With the patient in the left lateral decubitus position, the Olympus pediatric video colonoscope was inserted and advanced under direct visualization.  The prep was slightly suboptimal areas with sticky adherent stool.  We were able to advance to the cecum without difficulty.  The ileocecal valve and appendiceal orifice were seen.  The mucosa was grossly normal, possibly slight edema but no gross colitis or ulcerations.  We took random biopsies on the way out and biopsies from the cecum, ascending colon and transverse colon were placed in jar #1.  Biopsies from the descending  and sigmoid colon and rectum were placed in jar #2.  All this mucosa was grossly endoscopically normal.  The scope was withdrawn.  The patient tolerated the procedure well.  ASSESSMENT: 1. No evidence of gross colitis.  The patient may well have microscopic   colitis. Will need to check biopsies. 2. Rectal bleeding probably due to large internal and external hemorrhoids.  PLAN:  Will check pathology.  Give a hemorrhoid sheet.  See back in the office in four to six weeks. Dictated by:   Llana Aliment. Randa Evens, M.D. Attending Physician:  Orland Mustard DD:  10/16/01 TD:  10/16/01 Job: [id number]

## 2010-11-26 NOTE — Discharge Summary (Signed)
[name], Heath NO.:  [id number]   MEDICAL RECORD NO.:  [id number]          PATIENT TYPE:  INP   LOCATION:  3027                         FACILITY:  [facility]   PHYSICIAN:  Marlan Palau, M.D.  DATE OF BIRTH:  1935/04/05   DATE OF ADMISSION:  07/07/2004  DATE OF DISCHARGE:  07/12/2004                                 DISCHARGE SUMMARY   ADMISSION DIAGNOSES:  1.  History of multiple sclerosis with recent exacerbation with gait      disorder.  2.  Diabetes.  3.  Obesity.   DISCHARGE DIAGNOSES:  1.  Multiple sclerosis with recent exacerbation with gait disorder.  2.  Diabetes.  Blood sugars under poor control on steroids.  3.  History of obesity.   PROCEDURES DURING THIS ADMISSION:  1.  MRI of the brain.  2.  MRI of the cervical spine.   COMPLICATIONS OF ABOVE PROCEDURES:  None.   The patient also had Doppler of the veins of the lower extremities.   HISTORY OF PRESENT ILLNESS:  [name] is a 76 year old white female born  1935-05-06 with a history of multiple sclerosis followed by Dr. Avie Echevaria.  This patient has, in the past gone off of Asaron and Avonex due to  elevation of liver enzymes, currently on no medications of this type.  This  patient initially presented with an optic neuritis on the right.  Diagnosis  was made by MRI scan in March of 1985.  The patient has been followed by Dr.  Oneta Rack for her diabetes and other medical problems.  The patient was  brought into the hospital at this point with onset of problems with  progressive gait disorder over the several days prior to this admission.  The patient has noted significant weakness, particularly of the right lower  extremity.  The patient has had some problems with the right arm and right  leg over the last three to four weeks prior to admission.  Bowel and bladder  disturbance has not been noted.  The patient was only able to mobilize by  wheelchair prior to this admission.  The  patient is admitted for IV  methylprednisolone treatments.   PAST MEDICAL HISTORY:  Significant for:  1.  Multiple sclerosis with recent exacerbations.  2.  Diabetes.  3.  Hypercholesterolemia.  4.  Intermittent tachycardia.  5.  Obesity.   MEDICATIONS PRIOR TO THIS ADMISSION:  1.  Atenolol 50 mg a.m.  2.  Nortriptyline 50 mg q.h.s.  3.  Metformin 500 mg two twice a day with meals.  4.  Neurontin if needed.   The patient has no known allergies, does not smoke or drink.  Please refer  to history and physical dictation summary for social history, family  history, review of systems, physical examination.   LABORATORY VALUES:  Notable for a sodium of 139, potassium 3.6, chloride of  106, CO2 of 25, glucose of 175, BUN of 10, creatinine 0.7, calcium 9.4,  total protein 7.5, albumin of 3.5, AST of 55, ALT of 54, alkaline  phosphatase of 56, total bilirubin of 0.5.  CBC is notable for a white count  of 9.8, hemoglobin of 13.9, hematocrit 41.0, MCV of 83.7, platelets of  294,000.  Hemoglobin A1c of 7.8.   EKG reveals normal sinus rhythm with frequent premature ectopic complexes,  nonspecific ST and T wave abnormalities, heart rate of 85.   HOSPITAL COURSE:  This patient was admitted to [facility] with gait  disorder with particular right-sided weakness.  The patient was started on  IV methylprednisolone at 500 mg IV q.12h. for a total of 10 doses.  The  patient has also been seen by physical and occupational therapy.  A  rehabilitation consult was also obtained.  The patient seemed to gait good  improvement with the methylprednisolone treatments.  The patient has got  back to walking with a walker, ambulating fairly well, still had some mild  right leg weakness.  There was some swelling of the right leg and a venous  Doppler was obtained which was unremarkable.  The patient has been treated  with Lovenox during this admission.  The patient was felt to be too high  level for  inpatient rehabilitation and home health nursing and physical  therapy was recommended.  The patient has undergone an MRI scan study of the  brain which showed widespread changes of MS without definite acute plaques  noted.  The last scan of the cervical cord noted evidence of involvement  with MS of the C2 level on the right with some enhancement at this level  suggestive of an acute MS plaque.  This plaque was felt to be the cause of  her underlying symptoms.  The patient has completed her IV  methylprednisolone course and will be converted to a taper of prednisone 10  mg 12 day pack.  The patient will be discharged as well on Metformin 1000 mg  once twice a day, atenolol 50 mg one a day, amitriptyline 50 mg one at  night, Neurontin 100 mg three times a day, aspirin 81 mg daily.  The patient  is to walk with a walker, be on a diabetic diet, 1800 calorie, no added salt  diet, will get home health, physical therapy and nursing to see this  patient.  The patient will need to follow blood sugars very closely as she  has required some insulin while on methylprednisolone but she will be  transitioned off of prednisone within the next 10-12 days.  It is possible  that the patient may require some temporary modification of her diabetic  treatment during this period of time.  The patient will follow up with Dr.  Sandria Manly in 4-6 weeks, will need to contact Dr. Oneta Rack concerning her diabetic  control if it  appears to be less than adequate over the next two weeks.  At the time of  discharge, the patient is bright and alert, cooperative, has good strength  in both arms, left leg, has 4/5 strength of the right leg but is ambulatory  with a walker.       CKW/MEDQ  D:  07/12/2004  T:  07/12/2004  Job:  [id number]   cc:   [facility]  [address], [address]   Lucky Cowboy, M.D.  [address], [address]  [place], [place] [address]  Fax: [phone]

## 2010-11-26 NOTE — Discharge Summary (Signed)
[name], [name]                   ACCOUNT NO.:  [id number]   MEDICAL RECORD NO.:  [id number]          PATIENT TYPE:  INP   LOCATION:  5526                         FACILITY:  [facility]   PHYSICIAN:  Mobolaji B. Bakare, M.D.DATE OF BIRTH:  August 05, 1934   DATE OF ADMISSION:  10/09/2006  DATE OF DISCHARGE:  10/20/2006                               DISCHARGE SUMMARY   PRIMARY CARE PHYSICIAN:  Lucky Cowboy, M.D.   FINAL DIAGNOSES:  1. Community-acquired pneumonia.  2. Escherichia coli urinary tract infection.  3. Acute otitis media.  4. Fluid overload.  5. Deconditioned.   SECONDARY DIAGNOSES:  1. Diabetes mellitus.  2. Multiple sclerosis.  3. Dyslipidemia.   PROCEDURES:  1. Chest x-ray, done on March 31, showed low lung volumes with      bibasilar atelectasis.  2. Chest x-ray on April 4 showed no chronic bronchitic changes.  3. CT of the orbits showed mild soft-tissue thickening around the      ossicles on the right, which is called due to scarring.  No fluid      levels seen.  No cholesteatoma or mass.  Master sinus is clear.      Chronic paranasal sinusitis, normal temporal bones.  4. Chest x-ray, done on April 8, showed vascular congestion and a      followup chest x-ray on April 10 showed increased diffuse      interstitial pulmonary edema.  5. Chest x-ray, done on April 11, showed no acute cardiopulmonary      findings.  6. 2D echocardiogram, done on April 10, showed normal left ventricular      systolic function with estimated ejection fraction of 55-60%, left      ventricular wall thickness, moderate mitral annular calcification.   BRIEF HISTORY:  [name] is a 76 year old Caucasian female, who  presented with cough and weakness for several days and upper respiratory  symptoms.  She was admitted for presumptive community-acquired pneumonia  with hypoxia, although chest x-ray did not show any pneumonic  consolidation.  She had also pyuria with positive nitrites and  large  amounts of leukocyte esterase on urinalysis, white blood cells 21-50 and  clumping was noted.  Please see admission H&P for full details.   HOSPITAL COURSE:  1. Community-acquired pneumonia:  Patient was started on IV Rocephin      and Zithromax.  She remained afebrile and white cell count      normalized.  Intravenous antibiotics were transitioned to p.o.      Avalox.  She has completed ten days of antibiotics.  2. Escherichia coli UTI:  This was covered with the intravenous      antibiotics given with Rocephin.  The followup urine culture was      negative.  Patient has no urinary symptoms.  The E.coli was      sensitive to Rocephin.  3. Acute otitis media:  Patient complained of right earache.  On      examination she had a hyperemic tympanic membrane.  No changes were      made to her antibiotics.  Overall, right earache resolved.  She had      a temporal bone CT.  There was no mastoiditis.  4. Multiple sclerosis:  Patient was continued on home medication with      Betaseron.  She did not describe any new neurological symptoms.      She has had no extremity weakness.  She was seen by physical      therapist and it was felt patient would require short-term skilled      nursing facility placement for rehabilitation.  5. Diabetes mellitus:  She had an episode of hypoglycemia and      medications were adjusted.  Blood glucose is now controlled.  6. Fluid overload:  Patient was noted to have lung changes with      crackles three days prior to discharge.  She does not have a      history of CHF.  She had a chest x-ray, which showed vascular      congestion.  She was given Lasix.  A followup chest x-ray showed      worsening of pulmonary interstitial edema.  She was continued on a      higher dose of Lasix.  A 2D echocardiogram reflected normal left      ventricular function and there was no mention of diastolic      dysfunction.  Ejection fraction was 55-60%.  She was  aggressively      diuresed.  The BNP showed 31 upon discharge and followup chest x-      ray showed resolved pulmonary vascular congestion.  She is not      deemed to have acute systolic heart failure, nor diastolic heart      failure.  This probably represents fluid overload.  It is      noteworthy to mention that patient's cardiac enzymes were normal.      Lasix was discontinued prior to discharge.   DISCHARGE MEDICATIONS:  1. Aspirin 81 mg daily.  2. Tenormin 50 mg daily.  3. Zetia 10 mg daily.  4. Glyburide 5 mg daily.  5. Guaifenesin 600 mg b.i.d. for one week.  6. Metformin 500 mg p.o. daily.  7. Betaseron 0.3 mg subcu daily.  8. Xanax 0.5 mg q.h.s. p.r.n.  9. Trazodone 25 mg p.o. q.h.s.  10.Vicodin 1-2 tablets p.o. q. 4 hours p.r.n. pain.   DISCHARGE CONDITION:  Stable.   VITAL SIGNS ON DISCHARGE:  Temperature 97.7, blood pressure 114/71,  heart rate 78, respiratory rate 18.   DISCHARGE LABORATORY DATA:  White cells 7.7, hemoglobin 12.9, hematocrit  27.6, platelets 544.  Sodium 142, potassium 4.4, BUN 13, creatinine 0.8,  calcium 9.8, TSH 0.62, BNP 31.      Mobolaji B. Corky Downs, M.D.  Electronically Signed     MBB/MEDQ  D:  10/20/2006  T:  10/20/2006  Job:  [id number]   cc:   Lucky Cowboy, M.D.

## 2010-11-26 NOTE — H&P (Signed)
NAMEVELVET, MOOMAW NO.:  [id number]   MEDICAL RECORD NO.:  [id number]          PATIENT TYPE:  EMS   LOCATION:  MAJO                         FACILITY:  [facility]   PHYSICIAN:  Hillery Aldo, M.D.   DATE OF BIRTH:  09/11/1934   DATE OF ADMISSION:  10/09/2006  DATE OF DISCHARGE:                              HISTORY & PHYSICAL   Audio too short to transcribe (less than 5 seconds)      Hillery Aldo, M.D.     CR/MEDQ  D:  10/09/2006  T:  10/09/2006  Job:  [id number]

## 2010-11-26 NOTE — H&P (Signed)
NAMEBELKY, MUNDO NO.:  [id number]   MEDICAL RECORD NO.:  [id number]          PATIENT TYPE:  INP   LOCATION:  3027                         FACILITY:  [facility]   PHYSICIAN:  Genene Churn. Love, M.D.    DATE OF BIRTH:  07-18-1934   DATE OF ADMISSION:  07/07/2004  DATE OF DISCHARGE:                                HISTORY & PHYSICAL   HISTORY OF PRESENT ILLNESS:  This is one of several [facility]  admissions for this 76 year old right-handed, white, married female with  known multiple sclerosis, admitted for evaluation of progressive gait  disorder in the setting of diabetes mellitus.   HISTORY OF PRESENT ILLNESS:  Ms. Gluth was first seen by me July 13, 1983,  at age 57.  At that time, she had the onset of right eye discomfort and was  found to have evidence of optic neuritis with acuity of 20/40 in the right  eye and 20/20 in the left.  There was a right [facility]	[employer] [facility] pupil present. MRI  study of the brain September 09, 1983, showed evidence of increased 2-T signal in  the right parietal region, Centrum semiovale and other areas in the brain  thought to represent evidence of underlying multiple sclerosis.  She  underwent a blood study at that time which were unremarkable.  She  subsequently was lost to followup evaluation and returned in 2004 at the  request of Dr. Harlen Labs.  At that time, she had a known history of multiple  sclerosis and had a six month history of diabetes mellitus.  She complained  of pain occurring in her right, greater than her left thigh, and weakness in  her right leg.  Her examination was remarkable for acuity of 20/50 in the  right eye and 20/40 in the left.  No Marcus Gunn pupil and __________  posterior tibialis, right gastrocnemius soleus, bilateral and iliopsoas  soleus, right greater than left, and tendon reflexes that were 2+ with  downgoing plantar responses.  It was felt at that time that she most likely  had multiple sclerosis as  the cause for her weakness.  She underwent a  course of p.o. steroids that improved her symptomatology without aggravating  her underlying diabetes mellitus.  Evaluation at that time included MRI of  the lumbar spine with and without contrast enhancement which showed moderate  spinal stenosis at the L3-L4 level secondary to facet hypertrophy without  definite compression of exiting nerve roots, bilateral neural foraminal  stenosis at L5-S1, placing the exiting L5 nerve root at risk.  Hip x-ray  showed mild spurring of the right  trochanter on both sides with small  ossicles of the superior and right greater trochanter.  EMG and nerve  conduction study revealed a normal study.  There was no evidence of a  myopathy or polyneuropathy present.  She subsequently underwent a course of  prednisone in December of 2004 with improvement in symptomatology without  worsening of her diabetes mellitus.  She was placed on Avonex therapy which  she began sometime in March of 2005,  being hesitant to take the medication.  She then developed elevated liver function tests.  She was taken off of  Avonex in the late Spring of 2005, and has had improvement in liver function  tests since that time.   Over the last three to four weeks, she has had weakness in her right arm and  her right leg with circumduction of her right leg.  She has been unable to  walk in the last few weeks, particularly this day, and comes in by  wheelchair, having been lifted to the wheelchair by her husband.  Bowel and  bladder control have been okay.  She denies any Lhermitte sign, single eye  visual loss or double vision.  She has not been checking her blood sugars as  an outpatient.  The last was approximately two weeks ago.  Of note is that  blood sugars obtained in routine blood studies to evaluate side effects of  Avonex revealed glucoses in the 264, 228, and 314.  She has not had any  fall.   PAST MEDICAL HISTORY:  Her past  medical history is significant for diabetes  mellitus since 2004, high cholesterol for approximately seven years,  intermittent tachycardia.  She has had no hospitalizations or operations and  denies any allergies.   CURRENT MEDICATIONS:  1.  Atenolol 50 mg daily.  2.  Amitriptyline 50 mg q.h.s.  3.  Metformin 500 mg, two tablets twice per day.  4.  She had been on Betaseron rather than Avonex which she began in March,      and discontinued because of elevated liver function tests.  5.  Neurontin as needed.   SOCIAL HISTORY:  She is married.  She says that she is happy with life.  She  denies any home problems.  She lives with her husband.  She has three  daughters, one of whom has MS, and one son.  She finished high-school.  She  last worked in 1995.   FAMILY HISTORY:  Positive in that her mother has heart disease.   REVIEW OF SYSTEMS:  Medical review of systems revealed elevated liver  function tests which may have been related to Lipitor rather than due to  Betaseron since she continued the Lipitor about the time that she  discontinued her  Betaseron.   PHYSICAL EXAMINATION:  GENERAL:  A well-developed, obese white female.  VITAL SIGNS:  Blood pressure right arm 130/80, left arm 140/80.  Heart rate  92 and regular.  There were no bruits.  MENTAL STATUS:  She was alert and oriented x3.  Following one, two and three  step commands.  Cranial nerve examination revealed visual fields to be full.  Both discs were seen and flat.  There was power to the right disk. Face was  symmetric.  Tongue was midline.  Uvula was midline. Gags were present.  Acuity was 20/50 bilaterally without glasses.  Discs were flat.  Corneas  were present, and facial sensation was equal with slight left ptosis.  Motor  examination revealed decreased right hand and arm swing.  Her motor  examination was essentially 5/5 on the left in the upper and lower extremity, and she was 4/5 in the right arm. In the right  leg, she was 2 out  of 5 proximally with inability to use her right  iliopsoas and 3/5 distally.  Plantar responses were bilaterally downgoing.  Sensation revealed decreased  pinprick in her right arm and her right hand as compared to the  left.  Deep  tendon reflexes were 1-2+.  Plantar responses were downgoing.  With the  __________ object she could squeeze times one 4 kg right hand __________ 25  kg right hand times one, 17 kg left hand x 570 kg left hand.  Her heart was  without murmur.  Lungs were clear.  Bowel sounds were normal. Breasts were  normal.  There was no enlargement of heart, liver, spleen or kidneys.   ASSESSMENT:  1.  Multiple sclerosis, code 340.  2.  Gait disorder, code 781.3.  3.  Diabetes mellitus, code 250.60.   In view of the patient's poor control of diabetes by blood studies we have  in the past, I think we should admit her for further evaluation, to start  high dose IV Solu-Medrol.       JML/MEDQ  D:  07/07/2004  T:  07/07/2004  Job:  [id number]

## 2010-12-05 ENCOUNTER — Emergency Department (HOSPITAL_COMMUNITY): Payer: Medicare Other

## 2010-12-05 ENCOUNTER — Emergency Department (HOSPITAL_COMMUNITY)
Admission: EM | Admit: 2010-12-05 | Discharge: 2010-12-05 | Disposition: A | Discharge status: Home / Self Care | Payer: Medicare Other | Attending: Emergency Medicine | Admitting: Emergency Medicine

## 2010-12-05 DIAGNOSIS — G35 Multiple sclerosis: Secondary | ICD-10-CM | POA: Insufficient documentation

## 2010-12-05 DIAGNOSIS — I498 Other specified cardiac arrhythmias: Secondary | ICD-10-CM | POA: Insufficient documentation

## 2010-12-05 DIAGNOSIS — E119 Type 2 diabetes mellitus without complications: Secondary | ICD-10-CM | POA: Insufficient documentation

## 2010-12-05 DIAGNOSIS — Z8701 Personal history of pneumonia (recurrent): Secondary | ICD-10-CM | POA: Insufficient documentation

## 2010-12-05 DIAGNOSIS — G8250 Quadriplegia, unspecified: Secondary | ICD-10-CM | POA: Insufficient documentation

## 2010-12-05 DIAGNOSIS — I10 Essential (primary) hypertension: Secondary | ICD-10-CM | POA: Insufficient documentation

## 2010-12-05 LAB — CK TOTAL AND CKMB (NOT AT ARMC)
CK, MB: 0.9 ng/mL (ref 0.3–4.0)
Relative Index: INVALID (ref 0.0–2.5)

## 2010-12-05 LAB — URINALYSIS, ROUTINE W REFLEX MICROSCOPIC
Bilirubin Urine: NEGATIVE
Ketones, ur: NEGATIVE mg/dL
Nitrite: NEGATIVE
Specific Gravity, Urine: 1.015 (ref 1.005–1.030)
Urobilinogen, UA: 0.2 mg/dL (ref 0.0–1.0)

## 2010-12-05 LAB — TROPONIN I: Troponin I: <0.3 ng/mL (ref <0.30)

## 2010-12-05 LAB — CBC
Hemoglobin: 12.4 g/dL (ref 12.0–15.0)
MCH: 28.8 pg (ref 26.0–34.0)
MCHC: 33.3 g/dL (ref 30.0–36.0)
RDW: 14.2 % (ref 11.5–15.5)

## 2010-12-05 LAB — COMPREHENSIVE METABOLIC PANEL
AST: 9 U/L (ref 0–37)
CO2: 25 mEq/L (ref 19–32)
Calcium: 9.3 mg/dL (ref 8.4–10.5)
Creatinine, Ser: 0.56 mg/dL (ref 0.4–1.2)
GFR calc Af Amer: >60 mL/min (ref >60)
GFR calc non Af Amer: >60 mL/min (ref >60)

## 2010-12-05 LAB — DIFFERENTIAL
Basophils Relative: 1 % (ref 0–1)
Eosinophils Absolute: 0.6 10*3/uL (ref 0.0–0.7)
Eosinophils Relative: 8 % — ABNORMAL HIGH (ref 0–5)
Monocytes Absolute: 1 10*3/uL (ref 0.1–1.0)
Monocytes Relative: 13 % — ABNORMAL HIGH (ref 3–12)
Neutro Abs: 4 10*3/uL (ref 1.7–7.7)

## 2011-04-05 LAB — COMPREHENSIVE METABOLIC PANEL
ALT: 19
AST: 30
Albumin: 3 — ABNORMAL LOW
Alkaline Phosphatase: 58
GFR calc Af Amer: >60
Glucose, Bld: 134 — ABNORMAL HIGH
Potassium: 5.1
Sodium: 136
Total Protein: 6.6

## 2011-04-05 LAB — CBC
HCT: 34.3 — ABNORMAL LOW
Hemoglobin: 11.5 — ABNORMAL LOW
Hemoglobin: 12.3
MCHC: 33.7
MCV: 85.3
MCV: 85.5
Platelets: 220
RBC: 4.01
RBC: 4.36
RBC: 5.01
WBC: 6.9

## 2011-04-05 LAB — BASIC METABOLIC PANEL
BUN: 15
CO2: 24
CO2: 28
Calcium: 8.3 — ABNORMAL LOW
Calcium: 9
Chloride: 113 — ABNORMAL HIGH
Chloride: 117 — ABNORMAL HIGH
Creatinine, Ser: 0.53
GFR calc Af Amer: >60
GFR calc Af Amer: >60
GFR calc Af Amer: >60
GFR calc non Af Amer: >60
Glucose, Bld: 72
Potassium: 3.1 — ABNORMAL LOW
Sodium: 139
Sodium: 143
Sodium: 143

## 2011-04-05 LAB — URINE CULTURE: Colony Count: NO GROWTH

## 2011-04-05 LAB — URINALYSIS, ROUTINE W REFLEX MICROSCOPIC
Glucose, UA: NEGATIVE
pH: 5.5

## 2011-04-05 LAB — C-REACTIVE PROTEIN: CRP: 19.1 — ABNORMAL HIGH (ref <0.6)

## 2011-04-05 LAB — PROTIME-INR: Prothrombin Time: 14.3

## 2011-04-05 LAB — LIPASE, BLOOD: Lipase: 15

## 2011-04-05 LAB — CLOSTRIDIUM DIFFICILE EIA

## 2011-04-05 LAB — TSH: TSH: 0.451

## 2011-04-05 LAB — URINE MICROSCOPIC-ADD ON

## 2011-04-05 LAB — DIFFERENTIAL
Basophils Relative: 0
Eosinophils Absolute: 0
Eosinophils Relative: 0
Monocytes Relative: 17 — ABNORMAL HIGH
Neutrophils Relative %: 67

## 2011-04-05 LAB — POCT CARDIAC MARKERS: CKMB, poc: <1 — ABNORMAL LOW

## 2011-04-07 LAB — URINE MICROSCOPIC-ADD ON

## 2011-04-07 LAB — POCT I-STAT, CHEM 8
Calcium, Ion: 1.07 — ABNORMAL LOW
Chloride: 98
Glucose, Bld: 279 — ABNORMAL HIGH
HCT: 35 — ABNORMAL LOW

## 2011-04-07 LAB — CBC
HCT: 34.1 — ABNORMAL LOW
HCT: 34.6 — ABNORMAL LOW
Hemoglobin: 11.6 — ABNORMAL LOW
Hemoglobin: 11.6 — ABNORMAL LOW
MCHC: 34
MCV: 85.1
Platelets: 231
Platelets: 298
RDW: 16.4 — ABNORMAL HIGH
RDW: 16.6 — ABNORMAL HIGH

## 2011-04-07 LAB — URINALYSIS, ROUTINE W REFLEX MICROSCOPIC
Glucose, UA: NEGATIVE
Hgb urine dipstick: NEGATIVE
Specific Gravity, Urine: 1.023
pH: 5.5

## 2011-04-07 LAB — URINE CULTURE

## 2011-04-07 LAB — CLOSTRIDIUM DIFFICILE EIA

## 2011-04-07 LAB — BASIC METABOLIC PANEL
BUN: 1 — ABNORMAL LOW
BUN: 5 — ABNORMAL LOW
BUN: <1 — ABNORMAL LOW
CO2: 29
CO2: 31
Calcium: 8.4
Chloride: 106
Creatinine, Ser: 0.43
GFR calc non Af Amer: >60
GFR calc non Af Amer: >60
Glucose, Bld: 103 — ABNORMAL HIGH
Glucose, Bld: 110 — ABNORMAL HIGH
Glucose, Bld: 110 — ABNORMAL HIGH
Potassium: 2.6 — CL
Potassium: 3.2 — ABNORMAL LOW
Sodium: 146 — ABNORMAL HIGH

## 2011-04-16 IMAGING — CR DG CHEST 2V
1 series · 1 of 1 positions shown · non-contrast
Comparison: 11/20/2008.

CLINICAL DATA: Shortness of breath.

CHEST - 2 VIEW

[view not recorded]
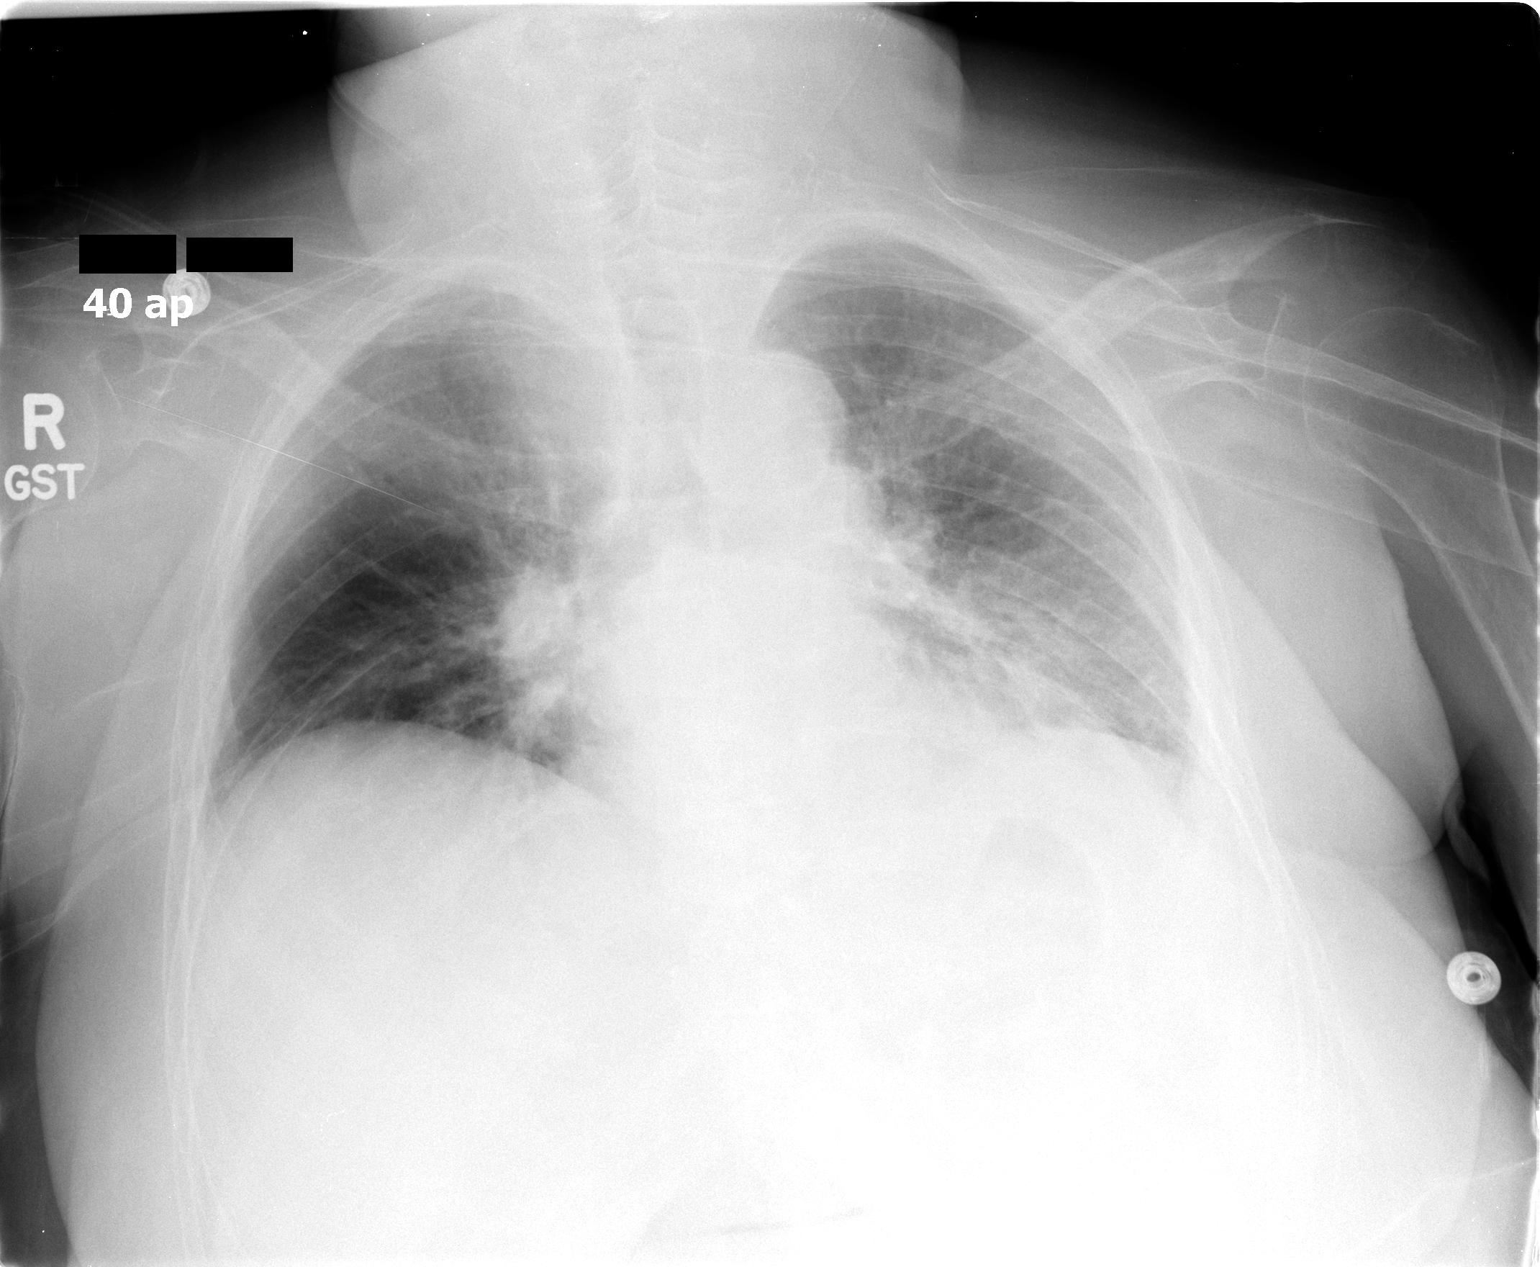

[1 of 1 positions shown; findings below may reference images not displayed]

FINDINGS: The low volumes exaggerate the heart size.  Mild
cardiomegaly is present.  Mild pulmonary vascular congestion is
new.  Left lower lobe airspace disease is also new.  No focal
airspace disease is evident on the right.
IMPRESSION: 1.  Interval development of pulmonary vascular congestion,
worrisome for heart failure.
2.  New left lower lobe airspace disease.  This raises concern for
bronchopneumonia.

## 2011-04-19 IMAGING — CR DG CHEST 1V PORT
1 series · 1 of 1 positions shown · non-contrast
Comparison: 09/03/2009

CLINICAL DATA: Pneumonia.

PORTABLE CHEST - 1 VIEW

[AP]
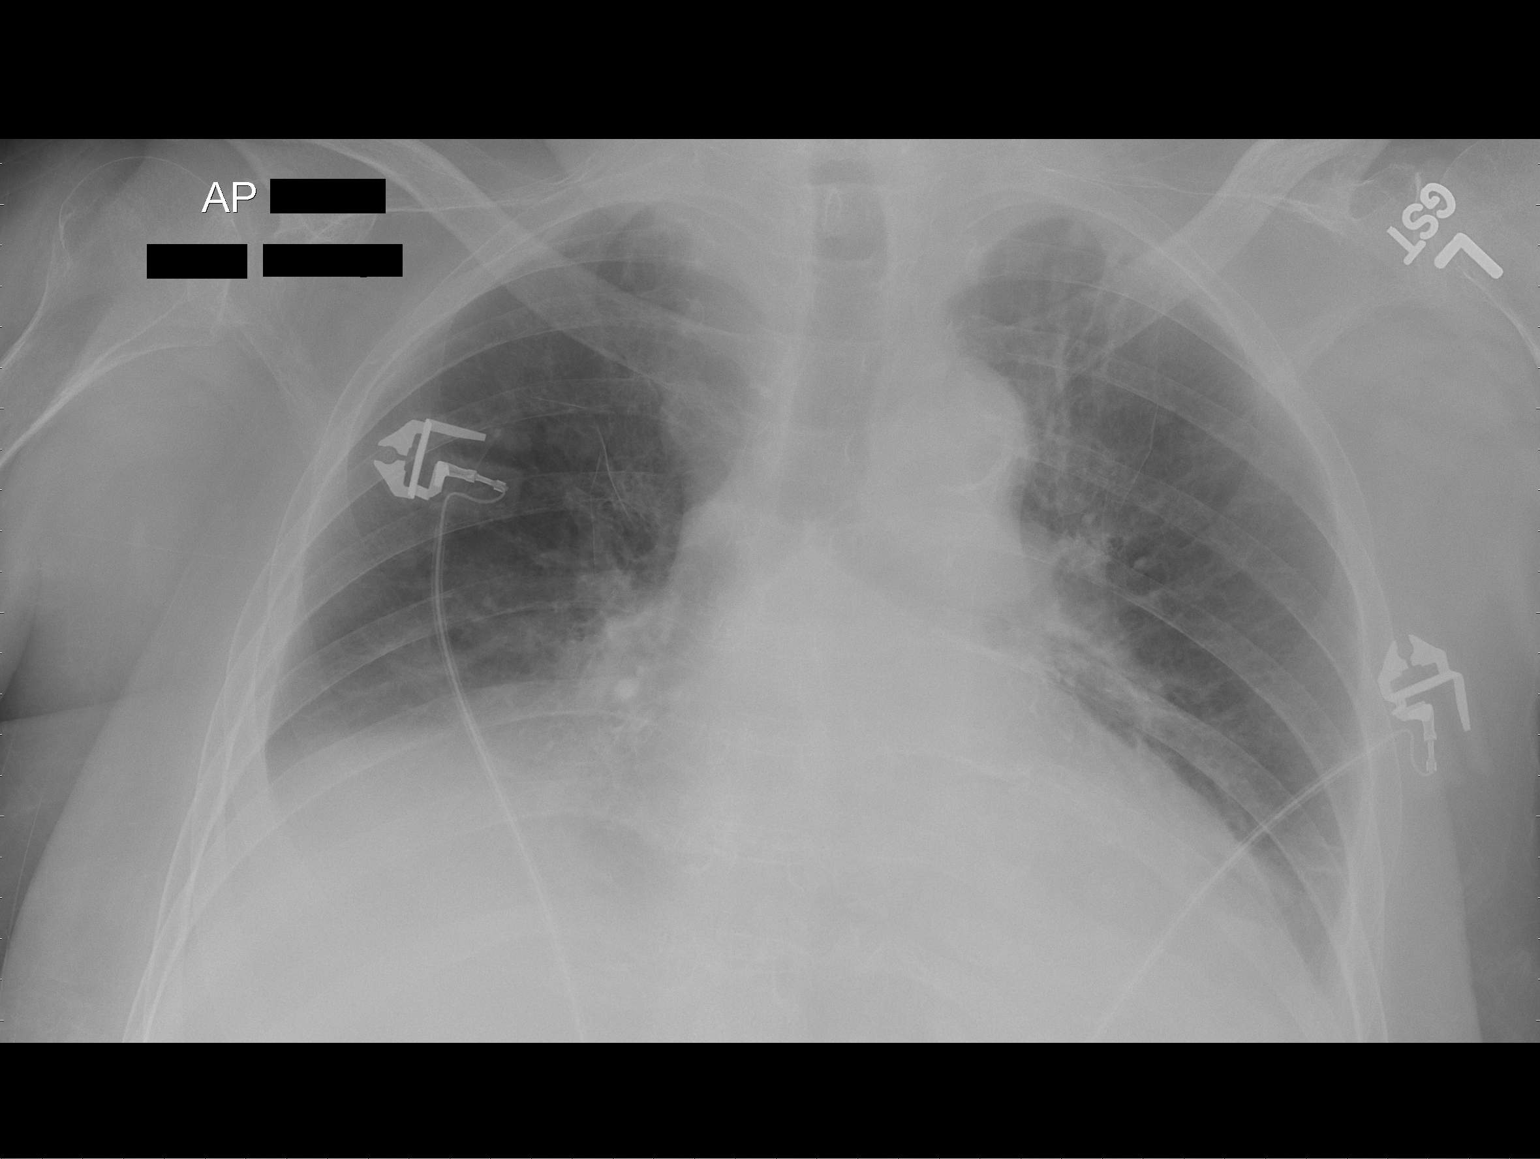

[1 of 1 positions shown; findings below may reference images not displayed]

FINDINGS: There are low lung volumes with bibasilar opacities, left
greater than right.  Increased slightly in the right base since
prior study.  Small bilateral effusions, stable.  Mild
cardiomegaly.
IMPRESSION: Low lung volumes with bibasilar opacities, slightly increasing on
the right since prior study.

Small effusions.

## 2011-04-19 IMAGING — CR DG CHEST 1V PORT
1 series · 1 of 1 positions shown · non-contrast
Comparison: 2542 hours the same day and earlier.

CLINICAL DATA: 74-year-old female with PICC line placement.

PORTABLE CHEST - 1 VIEW

[AP]
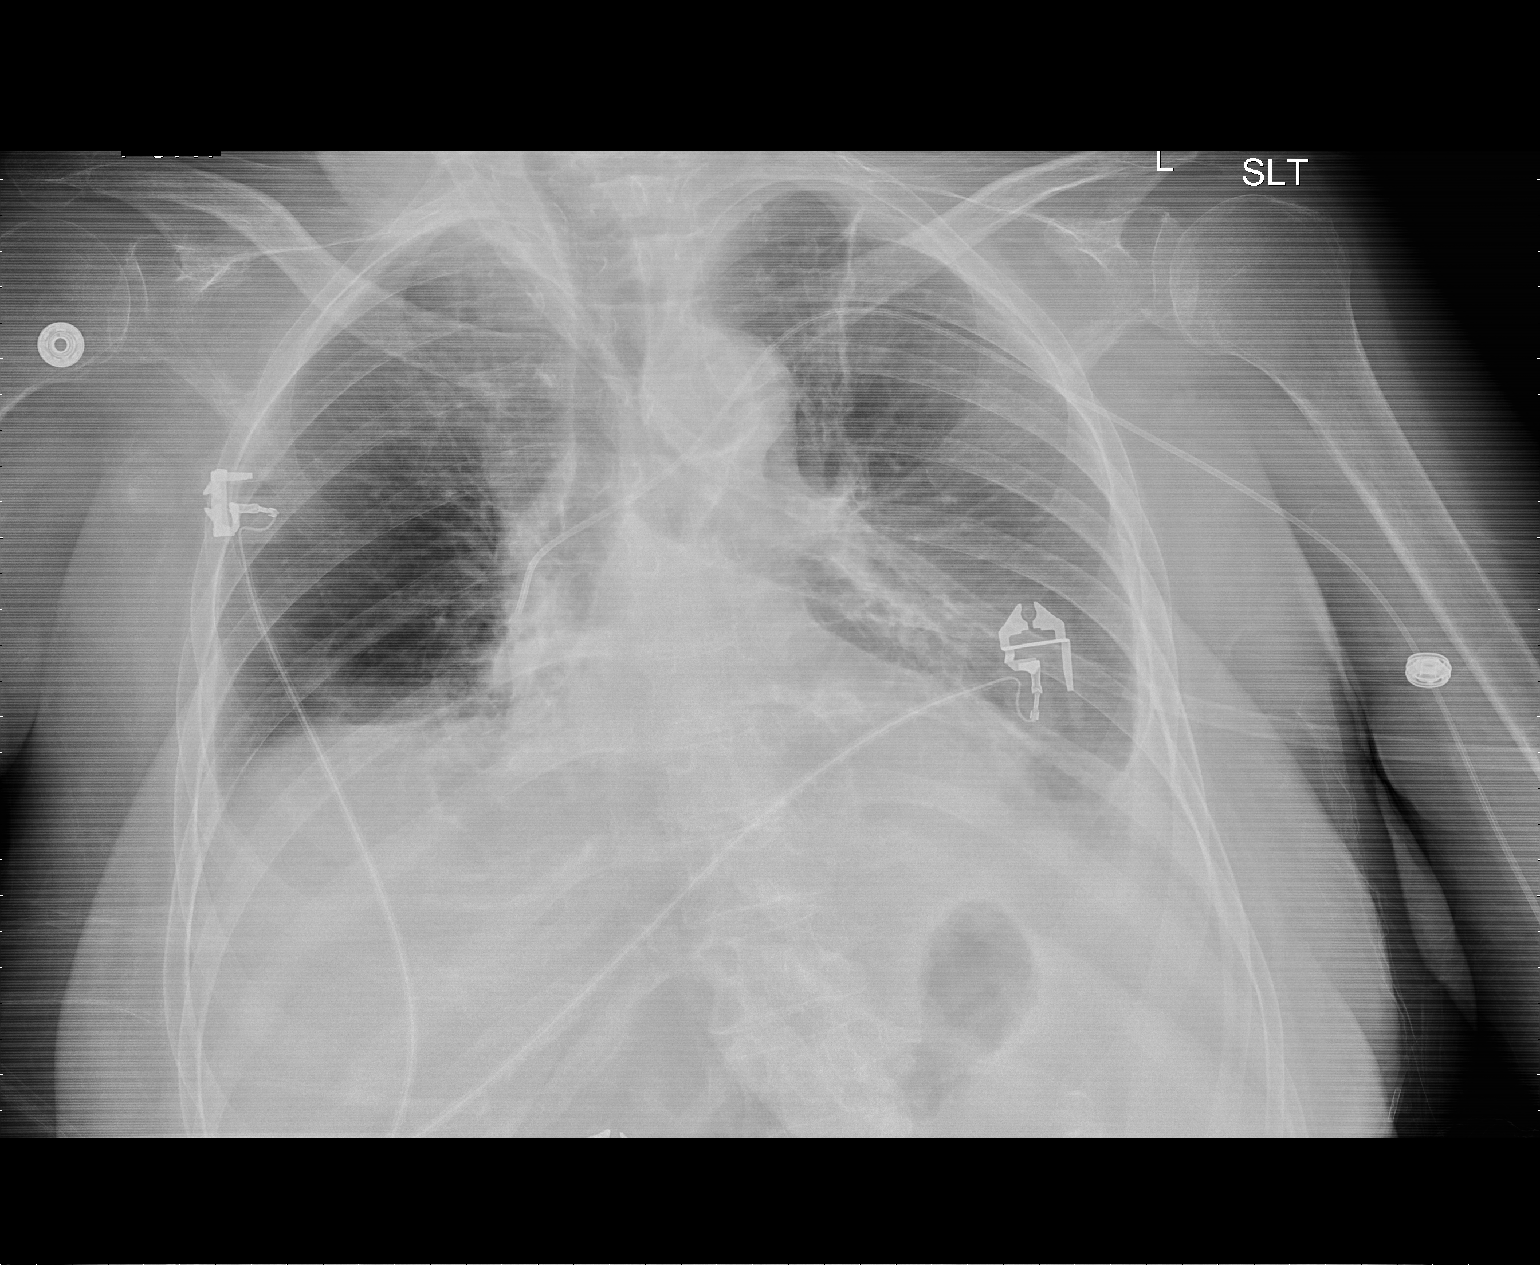

[1 of 1 positions shown; findings below may reference images not displayed]

FINDINGS: Portable semi upright AP view 2402 hours. Left upper
extremity approach PICC line catheter, tip at the level of the
lower superior vena cava.  Unchanged low lung volumes and bilateral
pleural effusions.  No pneumothorax.  Stable cardiac size and
mediastinal contours.  No overt pulmonary edema.
IMPRESSION: 1. Left upper extremity approach PICC line catheter, tip at the
level of the lower SVC.
2.  Stable pleural effusions and low lung volumes.

## 2011-09-10 ENCOUNTER — Ambulatory Visit: Payer: Self-pay | Admitting: Internal Medicine

## 2011-09-14 LAB — STOOL CULTURE

## 2011-09-19 ENCOUNTER — Other Ambulatory Visit (HOSPITAL_COMMUNITY): Payer: Self-pay | Admitting: Internal Medicine

## 2011-09-19 DIAGNOSIS — R509 Fever, unspecified: Secondary | ICD-10-CM

## 2011-09-19 DIAGNOSIS — R197 Diarrhea, unspecified: Secondary | ICD-10-CM

## 2011-09-20 ENCOUNTER — Other Ambulatory Visit (HOSPITAL_COMMUNITY): Payer: Self-pay | Admitting: Internal Medicine

## 2011-09-20 ENCOUNTER — Ambulatory Visit (HOSPITAL_COMMUNITY)
Admission: RE | Admit: 2011-09-20 | Discharge: 2011-09-20 | Disposition: A | Discharge status: Home / Self Care | Payer: Medicare Other | Source: Ambulatory Visit | Attending: Internal Medicine | Admitting: Internal Medicine

## 2011-09-20 DIAGNOSIS — D259 Leiomyoma of uterus, unspecified: Secondary | ICD-10-CM | POA: Insufficient documentation

## 2011-09-20 DIAGNOSIS — K8020 Calculus of gallbladder without cholecystitis without obstruction: Secondary | ICD-10-CM | POA: Insufficient documentation

## 2011-09-20 DIAGNOSIS — N281 Cyst of kidney, acquired: Secondary | ICD-10-CM | POA: Insufficient documentation

## 2011-09-20 DIAGNOSIS — I700 Atherosclerosis of aorta: Secondary | ICD-10-CM | POA: Insufficient documentation

## 2011-09-20 DIAGNOSIS — R197 Diarrhea, unspecified: Secondary | ICD-10-CM

## 2011-09-20 DIAGNOSIS — R509 Fever, unspecified: Secondary | ICD-10-CM | POA: Insufficient documentation

## 2011-09-20 DIAGNOSIS — K7689 Other specified diseases of liver: Secondary | ICD-10-CM | POA: Insufficient documentation

## 2011-09-20 DIAGNOSIS — K5730 Diverticulosis of large intestine without perforation or abscess without bleeding: Secondary | ICD-10-CM | POA: Insufficient documentation

## 2011-09-20 DIAGNOSIS — M810 Age-related osteoporosis without current pathological fracture: Secondary | ICD-10-CM | POA: Insufficient documentation

## 2011-09-20 DIAGNOSIS — I2510 Atherosclerotic heart disease of native coronary artery without angina pectoris: Secondary | ICD-10-CM | POA: Insufficient documentation

## 2011-09-20 MED ORDER — IOHEXOL 300 MG/ML  SOLN
100.0000 mL | Freq: Once | INTRAMUSCULAR | Status: AC | PRN
  Administered 2011-09-20: 100 mL via INTRAVENOUS

## 2011-10-05 ENCOUNTER — Ambulatory Visit (HOSPITAL_COMMUNITY)
Admission: RE | Admit: 2011-10-05 | Discharge: 2011-10-05 | Disposition: A | Discharge status: Skilled Nursing Facility | Payer: Medicare Other | Source: Ambulatory Visit | Attending: Gastroenterology | Admitting: Gastroenterology

## 2011-10-05 ENCOUNTER — Encounter (HOSPITAL_COMMUNITY)
Admission: RE | Disposition: A | Discharge status: Skilled Nursing Facility | Payer: Self-pay | Source: Ambulatory Visit | Attending: Gastroenterology

## 2011-10-05 ENCOUNTER — Encounter (HOSPITAL_COMMUNITY): Payer: Self-pay

## 2011-10-05 DIAGNOSIS — K5289 Other specified noninfective gastroenteritis and colitis: Secondary | ICD-10-CM | POA: Insufficient documentation

## 2011-10-05 DIAGNOSIS — K625 Hemorrhage of anus and rectum: Secondary | ICD-10-CM | POA: Insufficient documentation

## 2011-10-05 DIAGNOSIS — G35 Multiple sclerosis: Secondary | ICD-10-CM | POA: Insufficient documentation

## 2011-10-05 DIAGNOSIS — G8250 Quadriplegia, unspecified: Secondary | ICD-10-CM | POA: Insufficient documentation

## 2011-10-05 HISTORY — DX: Essential (primary) hypertension: I10

## 2011-10-05 HISTORY — DX: Myoneural disorder, unspecified: G70.9

## 2011-10-05 HISTORY — DX: Anxiety disorder, unspecified: F41.9

## 2011-10-05 HISTORY — DX: Hyperlipidemia, unspecified: E78.5

## 2011-10-05 HISTORY — PX: FLEXIBLE SIGMOIDOSCOPY: SHX5431

## 2011-10-05 HISTORY — DX: Multiple sclerosis: G35

## 2011-10-05 HISTORY — DX: Gastro-esophageal reflux disease without esophagitis: K21.9

## 2011-10-05 LAB — GLUCOSE, CAPILLARY: Glucose-Capillary: 61 mg/dL — ABNORMAL LOW (ref 70–99)

## 2011-10-05 SURGERY — SIGMOIDOSCOPY, FLEXIBLE
Anesthesia: Moderate Sedation

## 2011-10-05 MED ORDER — FENTANYL CITRATE 0.05 MG/ML IJ SOLN
INTRAMUSCULAR | Status: DC | PRN
  Administered 2011-10-05: 25 ug via INTRAVENOUS

## 2011-10-05 MED ORDER — FENTANYL CITRATE 0.05 MG/ML IJ SOLN
INTRAMUSCULAR | Status: AC
  Filled 2011-10-05: qty 2

## 2011-10-05 MED ORDER — SODIUM CHLORIDE 0.9 % IV SOLN: Freq: Once | INTRAVENOUS | Status: DC

## 2011-10-05 MED ORDER — MIDAZOLAM HCL 10 MG/2ML IJ SOLN
INTRAMUSCULAR | Status: AC
  Filled 2011-10-05: qty 2

## 2011-10-05 MED ORDER — MIDAZOLAM HCL 10 MG/2ML IJ SOLN
INTRAMUSCULAR | Status: DC | PRN
  Administered 2011-10-05: 2 mg via INTRAVENOUS

## 2011-10-05 MED ORDER — DEXTROSE 50 % IV SOLN
1.0000 | Freq: Once | INTRAVENOUS | Status: AC
  Administered 2011-10-05: 25 mL via INTRAVENOUS
  Filled 2011-10-05: qty 50

## 2011-10-05 NOTE — Discharge Instructions (Signed)
Resume diet and previous medicines.  If biopsies were taken the office will notify you by letter or phone in about 10 days.   Endoscopy Care After Please read the instructions outlined below and refer to this sheet in the next few weeks. These discharge instructions provide you with general information on caring for yourself after you leave the hospital. Your doctor may also give you specific instructions. While your treatment has been planned according to the most current medical practices available, unavoidable complications occasionally occur. If you have any problems or questions after discharge, please call your doctor. HOME CARE INSTRUCTIONS Activity  You may resume your regular activity but move at a slower pace for the next 24 hours.   Take frequent rest periods for the next 24 hours.   Walking will help expel (get rid of) the air and reduce the bloated feeling in your abdomen.   No driving for 24 hours (because of the anesthesia (medicine) used during the test).   You may shower.   Do not sign any important legal documents or operate any machinery for 24 hours (because of the anesthesia used during the test).  Nutrition  Drink plenty of fluids.   You may resume your normal diet.   Begin with a light meal and progress to your normal diet.   Avoid alcoholic beverages for 24 hours or as instructed by your caregiver.  Medications You may resume your normal medications unless your caregiver tells you otherwise. What you can expect today  You may experience abdominal discomfort such as a feeling of fullness or "gas" pains.   You may experience a sore throat for 2 to 3 days. This is normal. Gargling with salt water may help this.  Follow-up Your doctor will discuss the results of your test with you. SEEK IMMEDIATE MEDICAL CARE IF:  You have excessive nausea (feeling sick to your stomach) and/or vomiting.   You have severe abdominal pain and distention (swelling).   You  have trouble swallowing.   You have a temperature over 100 F (37.8 C).   You have rectal bleeding or vomiting of blood.

## 2011-10-05 NOTE — H&P (Signed)
  H+P: Patient has a history of mild colitis in the past it was felt to possibly be mild ulcerative colitis. She had a CT scan showing a thickened rectosigmoid and diarrhea with bloody mucus. She also has a history of colon polyps. She is in a nursing home with quadriplegia due to severe MS. She is brought in for a sigmoidoscopy and biopsy.  Medications/Allergies:  See nurses notes and scanned H+P  PE: See pre-op evaluation

## 2011-10-05 NOTE — Op Note (Signed)
[facility] [address] [place], [place]  [address]  FLEXIBLE SIGMOIDOSCOPY PROCEDURE REPORT  PATIENT:  [name], [name]  MR#:  [id number] BIRTHDATE:  Apr 15, 1935, 76 yrs. old  GENDER:  female  ENDOSCOPIST:  Carman Ching Referred by:  Murray Hodgkins, M.D.  PROCEDURE DATE:  10/05/2011 PROCEDURE:  Flexible Sigmoidoscopy with biopsy ASA CLASS:  Class III INDICATIONS:  bright red bleeding patient has had history of previous chronic colitis has flared up over the years. She has been treated in the past but has not recently been on treatment. She is confined to a nursing home because of quadriplegia resulting from severe multiple sclerosis. Procedure is done to evaluate for active colitis or cause of bleeding.  MEDICATIONS:   Fentanyl 25 mcg IV, Versed 2 mg IV  DESCRIPTION OF PROCEDURE:   After the risks benefits and alternatives of the procedure were thoroughly explained, informed consent was obtained.  Digital rectal exam was performed and revealed no abnormalities.   The pentax colo [id number] and [id number] endoscope was introduced through the anus and advanced to the sigmoid colon, limited by poor preparation. patient had been prepped with enemas and did have some sticky adherent stool. The quality of the prep was poor.  The instrument was then slowly withdrawn as the mucosa was fully examined. <<PROCEDUREIMAGES>>  Nodular mucosa was found. With standard forceps, biopsy was obtained and sent to pathology. mucosa was somewhat edematous and the extent of the exam which is about 35 cm from the anus. The rectum appeared to be relatively normal. Multiple biopsies were taken and placed in a single jar   Retroflexed views in the rectum revealed not performed.    The scope was then withdrawn from the patient and the procedure terminated.  COMPLICATIONS:  A complication of none occurred on 10/05/2011 at.  ENDOSCOPIC IMPRESSION: 1) Nodular mucosa 2) Not  performed possibly very mild colitis. No gross abnormalities to explain her diarrhea or bleeding. RECOMMENDATIONS: 1) await biopsy results if mild colitis present we may treat her with 5-ASA preparation.   REPEAT EXAM:  No  ______________________________ Carman Ching  CC:  Murray Hodgkins, MD  n. Rosalie DoctorCarman Ching at 10/05/2011 01:17 PM  Emmit Alexanders, [phone]

## 2011-10-06 ENCOUNTER — Encounter (HOSPITAL_COMMUNITY): Payer: Self-pay | Admitting: Gastroenterology

## 2011-11-16 ENCOUNTER — Emergency Department (HOSPITAL_COMMUNITY): Payer: Medicare Other

## 2011-11-16 ENCOUNTER — Inpatient Hospital Stay (HOSPITAL_COMMUNITY)
Admission: EM | Admit: 2011-11-16 | Discharge: 2011-11-24 | DRG: 871 | Disposition: A | Discharge status: Skilled Nursing Facility | Payer: Medicare Other | Attending: Internal Medicine | Admitting: Internal Medicine

## 2011-11-16 ENCOUNTER — Encounter (HOSPITAL_COMMUNITY): Payer: Self-pay | Admitting: *Deleted

## 2011-11-16 DIAGNOSIS — E876 Hypokalemia: Secondary | ICD-10-CM | POA: Diagnosis present

## 2011-11-16 DIAGNOSIS — J9600 Acute respiratory failure, unspecified whether with hypoxia or hypercapnia: Secondary | ICD-10-CM | POA: Diagnosis present

## 2011-11-16 DIAGNOSIS — D649 Anemia, unspecified: Secondary | ICD-10-CM

## 2011-11-16 DIAGNOSIS — I10 Essential (primary) hypertension: Secondary | ICD-10-CM | POA: Diagnosis present

## 2011-11-16 DIAGNOSIS — R000 Tachycardia, unspecified: Secondary | ICD-10-CM | POA: Diagnosis present

## 2011-11-16 DIAGNOSIS — T68XXXA Hypothermia, initial encounter: Secondary | ICD-10-CM | POA: Diagnosis present

## 2011-11-16 DIAGNOSIS — D696 Thrombocytopenia, unspecified: Secondary | ICD-10-CM

## 2011-11-16 DIAGNOSIS — D72829 Elevated white blood cell count, unspecified: Secondary | ICD-10-CM

## 2011-11-16 DIAGNOSIS — D509 Iron deficiency anemia, unspecified: Secondary | ICD-10-CM | POA: Diagnosis present

## 2011-11-16 DIAGNOSIS — J189 Pneumonia, unspecified organism: Secondary | ICD-10-CM | POA: Diagnosis present

## 2011-11-16 DIAGNOSIS — G35 Multiple sclerosis: Secondary | ICD-10-CM | POA: Diagnosis present

## 2011-11-16 DIAGNOSIS — G8250 Quadriplegia, unspecified: Secondary | ICD-10-CM

## 2011-11-16 DIAGNOSIS — A413 Sepsis due to Hemophilus influenzae: Secondary | ICD-10-CM

## 2011-11-16 DIAGNOSIS — A419 Sepsis, unspecified organism: Principal | ICD-10-CM | POA: Diagnosis present

## 2011-11-16 DIAGNOSIS — R0902 Hypoxemia: Secondary | ICD-10-CM

## 2011-11-16 DIAGNOSIS — E119 Type 2 diabetes mellitus without complications: Secondary | ICD-10-CM | POA: Diagnosis present

## 2011-11-16 DIAGNOSIS — R7989 Other specified abnormal findings of blood chemistry: Secondary | ICD-10-CM

## 2011-11-16 DIAGNOSIS — E0781 Sick-euthyroid syndrome: Secondary | ICD-10-CM | POA: Diagnosis present

## 2011-11-16 HISTORY — DX: Pneumonia, unspecified organism: J18.9

## 2011-11-16 LAB — CBC
HCT: 37.8 % (ref 36.0–46.0)
Platelets: 450 10*3/uL — ABNORMAL HIGH (ref 150–400)
RBC: 4.49 MIL/uL (ref 3.87–5.11)
RDW: 15.3 % (ref 11.5–15.5)
WBC: 18.3 10*3/uL — ABNORMAL HIGH (ref 4.0–10.5)

## 2011-11-16 LAB — BLOOD GAS, ARTERIAL
Bicarbonate: 23.2 mEq/L (ref 20.0–24.0)
pCO2 arterial: 39.7 mmHg (ref 35.0–45.0)
pH, Arterial: 7.385 (ref 7.350–7.400)
pO2, Arterial: 75.6 mmHg — ABNORMAL LOW (ref 80.0–100.0)

## 2011-11-16 LAB — BASIC METABOLIC PANEL
CO2: 20 mEq/L (ref 19–32)
Chloride: 102 mEq/L (ref 96–112)
Sodium: 140 mEq/L (ref 135–145)

## 2011-11-16 LAB — DIFFERENTIAL
Basophils Absolute: 0 10*3/uL (ref 0.0–0.1)
Lymphocytes Relative: 6 % — ABNORMAL LOW (ref 12–46)
Lymphs Abs: 1.1 10*3/uL (ref 0.7–4.0)
Monocytes Absolute: 1.2 10*3/uL — ABNORMAL HIGH (ref 0.1–1.0)
Neutro Abs: 16 10*3/uL — ABNORMAL HIGH (ref 1.7–7.7)

## 2011-11-16 LAB — EXPECTORATED SPUTUM ASSESSMENT W GRAM STAIN, RFLX TO RESP C

## 2011-11-16 LAB — LACTIC ACID, PLASMA: Lactic Acid, Venous: 2.1 mmol/L (ref 0.5–2.2)

## 2011-11-16 MED ORDER — ENOXAPARIN SODIUM 40 MG/0.4ML ~~LOC~~ SOLN
40.0000 mg | Freq: Every day | SUBCUTANEOUS | Status: DC
  Administered 2011-11-16 – 2011-11-23 (×8): 40 mg via SUBCUTANEOUS
  Filled 2011-11-16 (×9): qty 0.4

## 2011-11-16 MED ORDER — SODIUM CHLORIDE 0.9 % IJ SOLN
3.0000 mL | Freq: Two times a day (BID) | INTRAMUSCULAR | Status: DC
  Administered 2011-11-18 – 2011-11-23 (×4): 3 mL via INTRAVENOUS

## 2011-11-16 MED ORDER — VANCOMYCIN HCL IN DEXTROSE 1-5 GM/200ML-% IV SOLN
1000.0000 mg | INTRAVENOUS | Status: AC
  Administered 2011-11-16: 1000 mg via INTRAVENOUS
  Filled 2011-11-16: qty 200

## 2011-11-16 MED ORDER — GUAIFENESIN ER 600 MG PO TB12
1200.0000 mg | ORAL_TABLET | Freq: Two times a day (BID) | ORAL | Status: DC
  Administered 2011-11-16 – 2011-11-24 (×16): 1200 mg via ORAL
  Filled 2011-11-16 (×17): qty 2

## 2011-11-16 MED ORDER — ALBUTEROL SULFATE (5 MG/ML) 0.5% IN NEBU: 2.5000 mg | INHALATION_SOLUTION | RESPIRATORY_TRACT | Status: DC | PRN

## 2011-11-16 MED ORDER — DEXTROSE 5 % IV SOLN
1.0000 g | Freq: Three times a day (TID) | INTRAVENOUS | Status: DC
  Administered 2011-11-16 – 2011-11-20 (×11): 1 g via INTRAVENOUS
  Filled 2011-11-16 (×12): qty 1

## 2011-11-16 MED ORDER — ASPIRIN EC 81 MG PO TBEC
81.0000 mg | DELAYED_RELEASE_TABLET | Freq: Every day | ORAL | Status: DC
  Administered 2011-11-17 – 2011-11-24 (×8): 81 mg via ORAL
  Filled 2011-11-16 (×8): qty 1

## 2011-11-16 MED ORDER — ENOXAPARIN SODIUM 30 MG/0.3ML ~~LOC~~ SOLN: 30.0000 mg | SUBCUTANEOUS | Status: DC

## 2011-11-16 MED ORDER — GABAPENTIN 100 MG PO CAPS
100.0000 mg | ORAL_CAPSULE | Freq: Three times a day (TID) | ORAL | Status: DC
  Administered 2011-11-16 – 2011-11-24 (×23): 100 mg via ORAL
  Filled 2011-11-16 (×27): qty 1

## 2011-11-16 MED ORDER — METOPROLOL TARTRATE 12.5 MG HALF TABLET
12.5000 mg | ORAL_TABLET | Freq: Two times a day (BID) | ORAL | Status: DC
  Administered 2011-11-16 – 2011-11-17 (×3): 12.5 mg via ORAL
  Filled 2011-11-16 (×5): qty 1

## 2011-11-16 MED ORDER — ACETAMINOPHEN 650 MG RE SUPP: 650.0000 mg | Freq: Four times a day (QID) | RECTAL | Status: DC | PRN

## 2011-11-16 MED ORDER — ONDANSETRON HCL 4 MG PO TABS: 4.0000 mg | ORAL_TABLET | Freq: Four times a day (QID) | ORAL | Status: DC | PRN

## 2011-11-16 MED ORDER — ONDANSETRON HCL 4 MG/2ML IJ SOLN: 4.0000 mg | Freq: Four times a day (QID) | INTRAMUSCULAR | Status: DC | PRN

## 2011-11-16 MED ORDER — LEVOFLOXACIN IN D5W 750 MG/150ML IV SOLN
750.0000 mg | INTRAVENOUS | Status: AC
  Administered 2011-11-16 – 2011-11-18 (×3): 750 mg via INTRAVENOUS
  Filled 2011-11-16 (×3): qty 150

## 2011-11-16 MED ORDER — SODIUM CHLORIDE 0.9 % IV SOLN
Freq: Once | INTRAVENOUS | Status: AC
  Administered 2011-11-16: 17:00:00 via INTRAVENOUS

## 2011-11-16 MED ORDER — ACETAMINOPHEN 325 MG PO TABS
650.0000 mg | ORAL_TABLET | Freq: Four times a day (QID) | ORAL | Status: DC | PRN
  Administered 2011-11-21 – 2011-11-22 (×2): 650 mg via ORAL
  Filled 2011-11-16 (×3): qty 2

## 2011-11-16 MED ORDER — ASPIRIN 81 MG PO TABS: 81.0000 mg | ORAL_TABLET | Freq: Every day | ORAL | Status: DC

## 2011-11-16 MED ORDER — PANTOPRAZOLE SODIUM 40 MG PO TBEC
40.0000 mg | DELAYED_RELEASE_TABLET | Freq: Every day | ORAL | Status: DC
  Administered 2011-11-17 – 2011-11-24 (×8): 40 mg via ORAL
  Filled 2011-11-16 (×9): qty 1

## 2011-11-16 MED ORDER — FUROSEMIDE 40 MG PO TABS
40.0000 mg | ORAL_TABLET | Freq: Every day | ORAL | Status: DC
Start: 2011-11-17 — End: 2011-11-17

## 2011-11-16 MED ORDER — PIPERACILLIN-TAZOBACTAM 3.375 G IVPB
3.3750 g | INTRAVENOUS | Status: AC
  Administered 2011-11-16: 3.375 g via INTRAVENOUS
  Filled 2011-11-16: qty 50

## 2011-11-16 MED ORDER — FUROSEMIDE 10 MG/ML IJ SOLN
20.0000 mg | Freq: Once | INTRAMUSCULAR | Status: AC
  Administered 2011-11-17: 20 mg via INTRAVENOUS
  Filled 2011-11-16 (×2): qty 2

## 2011-11-16 MED ORDER — ALBUTEROL SULFATE (5 MG/ML) 0.5% IN NEBU
2.5000 mg | INHALATION_SOLUTION | Freq: Four times a day (QID) | RESPIRATORY_TRACT | Status: DC
  Administered 2011-11-16 – 2011-11-23 (×22): 2.5 mg via RESPIRATORY_TRACT
  Filled 2011-11-16 (×22): qty 0.5

## 2011-11-16 NOTE — ED Notes (Signed)
Attempted to call report; RN unavailable. Will call back.

## 2011-11-16 NOTE — ED Notes (Signed)
[id number]<AV> Expected date:<BR> Expected time: 2:39 PM<BR> Means of arrival:<BR> Comments:<BR> M10 - 84yoF Flank pain

## 2011-11-16 NOTE — ED Provider Notes (Signed)
History     CSN: [id number]  Arrival date & time 11/16/11  1443   First MD Initiated Contact with Patient 11/16/11 1555      Chief Complaint  Patient presents with  . Shortness of Breath  . Pneumonia    (Consider location/radiation/quality/duration/timing/severity/associated sxs/prior treatment) Patient is a 77 y.o. female presenting with shortness of breath and pneumonia. The history is provided by a relative. The history is limited by the condition of the patient.  Shortness of Breath  Associated symptoms include shortness of breath.  Pneumonia Associated symptoms include shortness of breath.   the patient is a 77 year old bedridden female, due to MS, who was sent to the emergency department for hypoxia and cough.  The patient had been diagnosed with pneumonia in the nursing home.  She had been given antibiotics.  However, she has not improved, so she was sent here for further evaluation and treatment.  The patient is confused.  I suspect.  She has dementia, therefore, level V caveat applies  Past Medical History  Diagnosis Date  . Hypertension   . Diabetes mellitus   . GERD (gastroesophageal reflux disease)   . Neuromuscular disorder   . Multiple sclerosis   . Anxiety   . Hyperlipemia     Past Surgical History  Procedure Date  . Tubal ligation   . Flexible sigmoidoscopy 10/05/2011    Procedure: FLEXIBLE SIGMOIDOSCOPY;  Surgeon: Vertell Novak., MD;  Location: [facility];  Service: Endoscopy;  Laterality: N/A;   pt. coming from [facility]-[phone] daughter phone no. [phone],daughter might come with her  sedation is needed, pt. is paraplegic    No family history on file.  History  Substance Use Topics  . Smoking status: Former Games developer  . Smokeless tobacco: Not on file  . Alcohol Use: No    OB History    Grav Para Term Preterm Abortions TAB SAB Ect Mult Living                  Review of Systems  Unable to perform ROS Respiratory: Positive for  shortness of breath.     Allergies  Review of patient's allergies indicates no known allergies.  Home Medications   Current Outpatient Rx  Name Route Sig Dispense Refill  . ASPIRIN 81 MG PO TABS Oral Take 81 mg by mouth daily.    . ATORVASTATIN CALCIUM 20 MG PO TABS Oral Take 20 mg by mouth See admin instructions. Takes 1 tablet daily on mondays, Wednesdays, and fridays.    Marland Kitchen CETIRIZINE HCL 10 MG PO TABS Oral Take 10 mg by mouth daily.    Marland Kitchen VITAMIN D 1000 UNITS PO TABS Oral Take 2,000 Units by mouth 2 (two) times daily.    . FUROSEMIDE 40 MG PO TABS Oral Take 40 mg by mouth daily.    Marland Kitchen GABAPENTIN 100 MG PO CAPS Oral Take 100 mg by mouth 3 (three) times daily.    . GLYBURIDE 2.5 MG PO TABS Oral Take 2.5 mg by mouth 2 (two) times daily with a meal.     . GUAIFENESIN ER 600 MG PO TB12 Oral Take 1,200 mg by mouth 2 (two) times daily.    . IBUPROFEN 800 MG PO TABS Oral Take 800 mg by mouth every 8 (eight) hours as needed. pain    . IPRATROPIUM-ALBUTEROL 0.5-2.5 (3) MG/3ML IN SOLN Nebulization Take 3 mLs by nebulization every 4 (four) hours as needed.    Marland Kitchen MESALAMINE 400 MG PO CPDR Oral  Take 800 mg by mouth 3 (three) times daily.    Marland Kitchen METFORMIN HCL ER 750 MG PO TB24 Oral Take 750 mg by mouth 2 (two) times daily.     Marland Kitchen METOPROLOL TARTRATE 25 MG PO TABS Oral Take 12.5 mg by mouth 2 (two) times daily.    Marland Kitchen MOXIFLOXACIN HCL 400 MG PO TABS Oral Take 400 mg by mouth daily.    Marland Kitchen PANTOPRAZOLE SODIUM 40 MG PO TBEC Oral Take 40 mg by mouth daily.    . SULFAMETHOXAZOLE-TRIMETHOPRIM 800-160 MG PO TABS Oral Take 1 tablet by mouth 2 (two) times daily.      BP 107/56  Pulse 123  Temp(Src) 98.6 F (37 C) (Oral)  Resp 24  SpO2 89%  Physical Exam  Vitals reviewed. Constitutional: She appears well-developed and well-nourished. No distress.  HENT:  Head: Normocephalic and atraumatic.  Eyes: Conjunctivae are normal.  Neck: Normal range of motion. Neck supple.  Cardiovascular:       Tachycardia    Pulmonary/Chest: She is in respiratory distress. She has rales.       Tachypnea hypoxia, bilateral rales  Abdominal: Soft. She exhibits no distension. There is no tenderness.  Musculoskeletal: She exhibits no edema and no tenderness.  Neurological: She is alert.       Confused  Skin: Skin is warm and dry.    ED Course  Procedures (including critical care time) 77 year old, female, with pneumonia, treated at the nursing home unsuccessfully, presents with persistent pneumonia, hypoxia, and tachypnea.  We'll repeat chest x-ray, perform laboratory testing, and give broader spectrum antibiotics.  I anticipate admission for further treatment   Labs Reviewed  CBC  DIFFERENTIAL  BASIC METABOLIC PANEL  CULTURE, BLOOD (ROUTINE X 2)  CULTURE, BLOOD (ROUTINE X 2)   Dg Chest Portable 1 View  11/16/2011  *RADIOLOGY REPORT*  Clinical Data: Cough, congestion, shortness of breath  PORTABLE CHEST - 1 VIEW  Comparison: Chest x-ray of 12/05/2010  Findings: There is more opacity at the right lung base suspicious for pneumonia.  A two-view chest x-ray would be helpful.  The left lung is clear.  The heart is within normal limits in size.  No bony abnormality is seen other than diffuse osteopenia.  IMPRESSION: Opacity throughout lung base suspicious for pneumonia.  Consider two-view chest x-ray.  Original Report Authenticated By: Juline Patch, M.D.     No diagnosis found.  6:23 PM Spoke  With [facility]. They will admit. Spoke with family.  Explained plan. They agree.   MDM  Healthcare associated pneumonia        Cheri Guppy, MD 11/16/11 [phone]

## 2011-11-16 NOTE — H&P (Signed)
PCP: Janyth Contes, MD Neurologist Dr. Fayrene Fearing love  Chief Complaint:  Shortness of breath  HPI: Ms. ishikawa is a 77 year old white female from skilled nursing facility with past history significant for advanced multiple sclerosis with quadriplegia, bedbound at baseline presents today with the above-mentioned complaint. Patient and daughter's report intermittent cough and congestion for several weeks. 6 days ago she had increased cough congestion or shortness of breath and was diagnosed with pneumonia and started on moxifloxacin at the facility, despite this she reported increasing congestion shortness of breath and stuffiness, and presented to the ER today.  She denies any fevers, reports chills. Denies any abdominal pain nausea or vomiting, diarrhea or urinary symptoms. Upon evaluation the emergency room noted to have a white count of 18,000 with a left shift and chest x-ray concerning for basilar pneumonia.  Allergies:  No Known Allergies    Past Medical History  Diagnosis Date  . Hypertension   . Diabetes mellitus   . GERD (gastroesophageal reflux disease)   . Neuromuscular disorder   . Multiple sclerosis   . Anxiety   . Hyperlipemia   . Pneumonia     Past Surgical History  Procedure Date  . Tubal ligation   . Flexible sigmoidoscopy 10/05/2011    Procedure: FLEXIBLE SIGMOIDOSCOPY;  Surgeon: Vertell Novak., MD;  Location: [facility];  Service: Endoscopy;  Laterality: N/A;   pt. coming from [facility]-[phone] daughter phone no. [phone],daughter might come with her  sedation is needed, pt. is paraplegic    Prior to Admission medications   Medication Sig Start Date End Date Taking? Authorizing Provider  aspirin 81 MG tablet Take 81 mg by mouth daily.   Yes Historical Provider, MD  atorvastatin (LIPITOR) 20 MG tablet Take 20 mg by mouth See admin instructions. Takes 1 tablet daily on mondays, Wednesdays, and fridays.   Yes Historical Provider, MD  cetirizine  (ZYRTEC) 10 MG tablet Take 10 mg by mouth daily.   Yes Historical Provider, MD  cholecalciferol (VITAMIN D) 1000 UNITS tablet Take 2,000 Units by mouth 2 (two) times daily.   Yes Historical Provider, MD  furosemide (LASIX) 40 MG tablet Take 40 mg by mouth daily.   Yes Historical Provider, MD  gabapentin (NEURONTIN) 100 MG capsule Take 100 mg by mouth 3 (three) times daily.   Yes Historical Provider, MD  glyBURIDE (DIABETA) 2.5 MG tablet Take 2.5 mg by mouth 2 (two) times daily with a meal.    Yes Historical Provider, MD  guaiFENesin (MUCINEX) 600 MG 12 hr tablet Take 1,200 mg by mouth 2 (two) times daily. 11/16/11 11/23/11 Yes Historical Provider, MD  ibuprofen (ADVIL,MOTRIN) 800 MG tablet Take 800 mg by mouth every 8 (eight) hours as needed. pain   Yes Historical Provider, MD  ipratropium-albuterol (DUONEB) 0.5-2.5 (3) MG/3ML SOLN Take 3 mLs by nebulization every 4 (four) hours as needed. 11/16/11 11/19/11 Yes Historical Provider, MD  Mesalamine (DELZICOL) 400 MG CPDR Take 800 mg by mouth 3 (three) times daily.   Yes Historical Provider, MD  metFORMIN (GLUCOPHAGE-XR) 750 MG 24 hr tablet Take 750 mg by mouth 2 (two) times daily.    Yes Historical Provider, MD  metoprolol tartrate (LOPRESSOR) 25 MG tablet Take 12.5 mg by mouth 2 (two) times daily.   Yes Historical Provider, MD  moxifloxacin (AVELOX) 400 MG tablet Take 400 mg by mouth daily. 11/11/11 11/21/11 Yes Historical Provider, MD  pantoprazole (PROTONIX) 40 MG tablet Take 40 mg by mouth daily.   Yes Historical Provider,  MD  sulfamethoxazole-trimethoprim (BACTRIM DS,SEPTRA DS) 800-160 MG per tablet Take 1 tablet by mouth 2 (two) times daily. 11/16/11 11/26/11 Yes Historical Provider, MD    Social History: Resident of [facility] on the facility, smoked one pack per day for 40-50 years, quit 13 years ago, denies any alcohol use   History reviewed. No pertinent family history.  Review of Systems:  Constitutional: Denies fever, chills, diaphoresis,  appetite change and fatigue.  HEENT: Denies photophobia, eye pain, redness, hearing loss, ear pain, congestion, sore throat, rhinorrhea, sneezing, mouth sores, trouble swallowing, neck pain, neck stiffness and tinnitus.   Respiratory: Denies SOB, DOE, cough, chest tightness,  and wheezing.   Cardiovascular: Denies chest pain, palpitations and leg swelling.  Gastrointestinal: Denies nausea, vomiting, abdominal pain, diarrhea, constipation, blood in stool and abdominal distention.  Genitourinary: Denies dysuria, urgency, frequency, hematuria, flank pain and difficulty urinating.  Musculoskeletal: Denies myalgias, back pain, joint swelling, arthralgias and gait problem.  Skin: Denies pallor, rash and wound.  Neurological: Denies dizziness, seizures, syncope, weakness, light-headedness, numbness and headaches.  Hematological: Denies adenopathy. Easy bruising, personal or family bleeding history  Psychiatric/Behavioral: Denies suicidal ideation, mood changes, confusion, nervousness, sleep disturbance and agitation   Physical Exam: Blood pressure 107/56, pulse 117, temperature 98.6 F (37 C), temperature source Oral, resp. rate 18, SpO2 96.00%. Frail thinly built white female lying on the stretcher in no acute distress, able to speak in full sentences not using muscles of respiration HEENT: PERRLA, oral mucosa moist, neck no JVD CVS: S1S2/RRR, tachycardic Lungs: Crackles at R base > L, poor airmovement bilaterally Abd: soft, NT, BS present Ext: no edema, no clubbing, cyanosis Neuro: quadriplegic   Labs on Admission:  Results for orders placed during the hospital encounter of 11/16/11 (from the past 48 hour(s))  CBC     Status: Abnormal   Collection Time   11/16/11  4:28 PM      Component Value Range Comment   WBC 18.3 (*) 4.0 - 10.5 (K/uL)    RBC 4.49  3.87 - 5.11 (MIL/uL)    Hemoglobin 12.6  12.0 - 15.0 (g/dL)    HCT 16.1  09.6 - 04.5 (%)    MCV 84.2  78.0 - 100.0 (fL)    MCH 28.1  26.0  - 34.0 (pg)    MCHC 33.3  30.0 - 36.0 (g/dL)    RDW 40.9  81.1 - 91.4 (%)    Platelets 450 (*) 150 - 400 (K/uL)   DIFFERENTIAL     Status: Abnormal   Collection Time   11/16/11  4:28 PM      Component Value Range Comment   Neutrophils Relative 88 (*) 43 - 77 (%)    Neutro Abs 16.0 (*) 1.7 - 7.7 (K/uL)    Lymphocytes Relative 6 (*) 12 - 46 (%)    Lymphs Abs 1.1  0.7 - 4.0 (K/uL)    Monocytes Relative 7  3 - 12 (%)    Monocytes Absolute 1.2 (*) 0.1 - 1.0 (K/uL)    Eosinophils Relative 0  0 - 5 (%)    Eosinophils Absolute 0.0  0.0 - 0.7 (K/uL)    Basophils Relative 0  0 - 1 (%)    Basophils Absolute 0.0  0.0 - 0.1 (K/uL)   BASIC METABOLIC PANEL     Status: Abnormal   Collection Time   11/16/11  4:28 PM      Component Value Range Comment   Sodium 140  135 - 145 (mEq/L)  Potassium 4.1  3.5 - 5.1 (mEq/L)    Chloride 102  96 - 112 (mEq/L)    CO2 20  19 - 32 (mEq/L)    Glucose, Bld 272 (*) 70 - 99 (mg/dL)    BUN 26 (*) 6 - 23 (mg/dL)    Creatinine, Ser 7.82 (*) 0.50 - 1.10 (mg/dL)    Calcium 9.9  8.4 - 10.5 (mg/dL)    GFR calc non Af Amer >90  >90 (mL/min)    GFR calc Af Amer >90  >90 (mL/min)   BLOOD GAS, ARTERIAL     Status: Abnormal   Collection Time   11/16/11  6:30 PM      Component Value Range Comment   FIO2 1.00      Delivery systems NRB MASK      pH, Arterial 7.385  7.350 - 7.400     pCO2 arterial 39.7  35.0 - 45.0 (mmHg)    pO2, Arterial 75.6 (*) 80.0 - 100.0 (mmHg)    Bicarbonate 23.2  20.0 - 24.0 (mEq/L)    TCO2 21.4  0 - 100 (mmol/L)    Acid-base deficit 1.1  0.0 - 2.0 (mmol/L)    O2 Saturation 94.2      Patient temperature 98.6      Collection site RIGHT RADIAL      Drawn by [phone]      Sample type ARTERIAL DRAW      Allens test (pass/fail) PASS  PASS      Radiological Exams on Admission: Dg Chest Portable 1 View  11/16/2011  *RADIOLOGY REPORT*  Clinical Data: Cough, congestion, shortness of breath  PORTABLE CHEST - 1 VIEW  Comparison: Chest x-ray of 12/05/2010   Findings: There is more opacity at the right lung base suspicious for pneumonia.  A two-view chest x-ray would be helpful.  The left lung is clear.  The heart is within normal limits in size.  No bony abnormality is seen other than diffuse osteopenia.  IMPRESSION: Opacity throughout lung base suspicious for pneumonia.  Consider two-view chest x-ray.  Original Report Authenticated By: Juline Patch, M.D.    Assessment/Plan 1. Acute hypoxic respiratory failure 2. Sepsis 3. Healthcare-associated pneumonia 4. Advanced MS (multiple sclerosis)/Quadriplegic 5. Suspected dysphagia Admit to SDU Check ABG stat Start Vancomycin, cefepime, levaquin, de-escalate in 48hours if cultures negative Blood cultures drawn in ED Check lactic acid, calcitonin level Will add albuterol nebs for pulmonary toilet Strongly suspect aspiration check swallow eval Also check UA Code: status discussed with patient, wishes to be a full code unfortunately Overall prognosis is poor Depending on clinical course may need palliative consultation if deteriorates   Time Spent on Admission:  Deklyn Trachtenberg [facility] Pager: [phone] 11/16/2011, 6:58 PM

## 2011-11-16 NOTE — ED Notes (Signed)
Per ems pt is from [facility]. Alert and oriented x4, nonambulatory due to MS. Pt had chest xray on 5/2 showed lower right lobe pneumonia. Since then increased difficulty breathing. Productive cough, yellow sputum. Pt complaints of all over achiness.

## 2011-11-17 DIAGNOSIS — R000 Tachycardia, unspecified: Secondary | ICD-10-CM

## 2011-11-17 DIAGNOSIS — E876 Hypokalemia: Secondary | ICD-10-CM | POA: Diagnosis not present

## 2011-11-17 DIAGNOSIS — A413 Sepsis due to Hemophilus influenzae: Secondary | ICD-10-CM

## 2011-11-17 DIAGNOSIS — G8250 Quadriplegia, unspecified: Secondary | ICD-10-CM

## 2011-11-17 DIAGNOSIS — A419 Sepsis, unspecified organism: Secondary | ICD-10-CM | POA: Diagnosis present

## 2011-11-17 DIAGNOSIS — D696 Thrombocytopenia, unspecified: Secondary | ICD-10-CM

## 2011-11-17 DIAGNOSIS — J9600 Acute respiratory failure, unspecified whether with hypoxia or hypercapnia: Secondary | ICD-10-CM

## 2011-11-17 LAB — GLUCOSE, CAPILLARY
Glucose-Capillary: 202 mg/dL — ABNORMAL HIGH (ref 70–99)
Glucose-Capillary: 157 mg/dL — ABNORMAL HIGH (ref 70–99)

## 2011-11-17 LAB — COMPREHENSIVE METABOLIC PANEL
ALT: 5 U/L (ref 0–35)
BUN: 26 mg/dL — ABNORMAL HIGH (ref 6–23)
CO2: 22 mEq/L (ref 19–32)
Calcium: 9.7 mg/dL (ref 8.4–10.5)
GFR calc Af Amer: >90 mL/min (ref >90)
GFR calc non Af Amer: >90 mL/min (ref >90)
Glucose, Bld: 204 mg/dL — ABNORMAL HIGH (ref 70–99)
Sodium: 137 mEq/L (ref 135–145)

## 2011-11-17 LAB — CARDIAC PANEL(CRET KIN+CKTOT+MB+TROPI)
CK, MB: 1.1 ng/mL (ref 0.3–4.0)
CK, MB: 1 ng/mL (ref 0.3–4.0)
Relative Index: INVALID (ref 0.0–2.5)
Total CK: 21 U/L (ref 7–177)
Total CK: 20 U/L (ref 7–177)
Troponin I: <0.3 ng/mL (ref <0.30)

## 2011-11-17 LAB — URINALYSIS, ROUTINE W REFLEX MICROSCOPIC
Bilirubin Urine: NEGATIVE
Ketones, ur: NEGATIVE mg/dL
Leukocytes, UA: NEGATIVE
Nitrite: NEGATIVE
Specific Gravity, Urine: 1.023 (ref 1.005–1.030)
Urobilinogen, UA: 0.2 mg/dL (ref 0.0–1.0)

## 2011-11-17 LAB — CBC
HCT: 35.3 % — ABNORMAL LOW (ref 36.0–46.0)
Hemoglobin: 11.4 g/dL — ABNORMAL LOW (ref 12.0–15.0)
MCH: 27.2 pg (ref 26.0–34.0)
MCHC: 32.3 g/dL (ref 30.0–36.0)
MCV: 84.2 fL (ref 78.0–100.0)
RBC: 4.19 MIL/uL (ref 3.87–5.11)

## 2011-11-17 LAB — LEGIONELLA ANTIGEN, URINE: Legionella Antigen, Urine: NEGATIVE

## 2011-11-17 LAB — MRSA PCR SCREENING: MRSA by PCR: NEGATIVE

## 2011-11-17 LAB — STREP PNEUMONIAE URINARY ANTIGEN: Strep Pneumo Urinary Antigen: NEGATIVE

## 2011-11-17 MED ORDER — BIOTENE DRY MOUTH MT LIQD
15.0000 mL | Freq: Two times a day (BID) | OROMUCOSAL | Status: DC
  Administered 2011-11-17 – 2011-11-23 (×14): 15 mL via OROMUCOSAL

## 2011-11-17 MED ORDER — VITAMINS A & D EX OINT
TOPICAL_OINTMENT | CUTANEOUS | Status: AC
  Administered 2011-11-17: 1
  Filled 2011-11-17: qty 5

## 2011-11-17 MED ORDER — POTASSIUM CHLORIDE CRYS ER 20 MEQ PO TBCR
40.0000 meq | EXTENDED_RELEASE_TABLET | ORAL | Status: AC
  Administered 2011-11-17 (×2): 40 meq via ORAL
  Filled 2011-11-17 (×2): qty 2

## 2011-11-17 MED ORDER — ENSURE PUDDING PO PUDG
1.0000 | Freq: Every day | ORAL | Status: DC
Start: 2011-11-17 — End: 2011-11-24
  Administered 2011-11-18 – 2011-11-23 (×4): 1 via ORAL
  Filled 2011-11-17 (×8): qty 1

## 2011-11-17 MED ORDER — SODIUM CHLORIDE 0.9 % IV SOLN
500.0000 mg | Freq: Two times a day (BID) | INTRAVENOUS | Status: DC
  Administered 2011-11-17 – 2011-11-22 (×11): 500 mg via INTRAVENOUS
  Filled 2011-11-17 (×12): qty 500

## 2011-11-17 MED ORDER — ALPRAZOLAM 0.25 MG PO TABS
0.2500 mg | ORAL_TABLET | Freq: Every evening | ORAL | Status: DC | PRN
  Administered 2011-11-17 – 2011-11-23 (×6): 0.25 mg via ORAL
  Filled 2011-11-17 (×7): qty 1

## 2011-11-17 MED ORDER — SODIUM CHLORIDE 0.9 % IV SOLN
INTRAVENOUS | Status: DC
  Administered 2011-11-16: 500 mL via INTRAVENOUS
  Administered 2011-11-17 – 2011-11-18 (×3): 75 mL/h via INTRAVENOUS
  Administered 2011-11-19: 1000 mL via INTRAVENOUS
  Administered 2011-11-20 – 2011-11-22 (×5): via INTRAVENOUS

## 2011-11-17 MED ORDER — INSULIN ASPART 100 UNIT/ML ~~LOC~~ SOLN
0.0000 [IU] | SUBCUTANEOUS | Status: DC
  Administered 2011-11-17 (×4): 2 [IU] via SUBCUTANEOUS
  Administered 2011-11-17: 3 [IU] via SUBCUTANEOUS
  Administered 2011-11-18: 1 [IU] via SUBCUTANEOUS
  Administered 2011-11-18: 2 [IU] via SUBCUTANEOUS
  Administered 2011-11-18: 1 [IU] via SUBCUTANEOUS
  Administered 2011-11-18: 2 [IU] via SUBCUTANEOUS
  Administered 2011-11-18: 1 [IU] via SUBCUTANEOUS
  Administered 2011-11-18: 2 [IU] via SUBCUTANEOUS
  Administered 2011-11-18: 3 [IU] via SUBCUTANEOUS
  Administered 2011-11-19: 2 [IU] via SUBCUTANEOUS
  Administered 2011-11-19: 3 [IU] via SUBCUTANEOUS
  Administered 2011-11-19: 2 [IU] via SUBCUTANEOUS
  Administered 2011-11-19: 17:00:00 via SUBCUTANEOUS
  Administered 2011-11-20: 3 [IU] via SUBCUTANEOUS
  Administered 2011-11-20 (×2): 2 [IU] via SUBCUTANEOUS
  Administered 2011-11-20 (×2): 1 [IU] via SUBCUTANEOUS
  Administered 2011-11-20 (×2): 3 [IU] via SUBCUTANEOUS
  Administered 2011-11-21: 1 [IU] via SUBCUTANEOUS
  Administered 2011-11-21: 2 [IU] via SUBCUTANEOUS
  Administered 2011-11-21 (×2): 3 [IU] via SUBCUTANEOUS
  Administered 2011-11-22: 1 [IU] via SUBCUTANEOUS
  Administered 2011-11-22: 2 [IU] via SUBCUTANEOUS
  Administered 2011-11-22 (×3): 1 [IU] via SUBCUTANEOUS
  Administered 2011-11-22: 3 [IU] via SUBCUTANEOUS
  Administered 2011-11-23 (×2): 1 [IU] via SUBCUTANEOUS
  Administered 2011-11-23: 3 [IU] via SUBCUTANEOUS
  Administered 2011-11-23: 2 [IU] via SUBCUTANEOUS
  Administered 2011-11-23 (×2): 1 [IU] via SUBCUTANEOUS
  Administered 2011-11-24: 2 [IU] via SUBCUTANEOUS
  Administered 2011-11-24: 1 [IU] via SUBCUTANEOUS

## 2011-11-17 MED ORDER — MAGNESIUM SULFATE 50 % IJ SOLN
3.0000 g | Freq: Once | INTRAVENOUS | Status: AC
  Administered 2011-11-17: 3 g via INTRAVENOUS
  Filled 2011-11-17: qty 6

## 2011-11-17 NOTE — Progress Notes (Signed)
ANTIBIOTIC CONSULT NOTE - INITIAL  Pharmacy Consult for Vancomycin Indication: pneumonia  No Known Allergies  Patient Measurements: Height: 5\' 5"  (165.1 cm) Weight: 110 lb 7.2 oz (50.1 kg) IBW/kg (Calculated) : 57  Adjusted Body Weight:   Vital Signs: Temp: 99.4 F (37.4 C) (05/08 2130) Temp src: Oral (05/08 2130) BP: 118/67 mmHg (05/08 2140) Pulse Rate: 84  (05/08 2236) Intake/Output from previous day: 05/08 0701 - 05/09 0700 In: 70 [I.V.:20; IV Piggyback:50] Out: -  Intake/Output from this shift: Total I/O In: 70 [I.V.:20; IV Piggyback:50] Out: -   Labs:  Basename 11/16/11 1628  WBC 18.3*  HGB 12.6  PLT 450*  LABCREA --  CREATININE 0.42*   Estimated Creatinine Clearance: 48.8 ml/min (by C-G formula based on Cr of 0.42). No results found for this basename: VANCOTROUGH:2,VANCOPEAK:2,VANCORANDOM:2,GENTTROUGH:2,GENTPEAK:2,GENTRANDOM:2,TOBRATROUGH:2,TOBRAPEAK:2,TOBRARND:2,AMIKACINPEAK:2,AMIKACINTROU:2,AMIKACIN:2, in the last 72 hours   Microbiology: Recent Results (from the past 720 hour(s))  CULTURE, EXPECTORATED SPUTUM-ASSESSMENT     Status: Normal   Collection Time   11/16/11 11:02 PM      Component Value Range Status Comment   Specimen Description SPUTUM EXPECTORATED   Final    Special Requests NONE   Final    Sputum evaluation     Final    Value: THIS SPECIMEN IS ACCEPTABLE. RESPIRATORY CULTURE REPORT TO FOLLOW.   Report Status 11/16/2011 FINAL   Final     Medical History: Past Medical History  Diagnosis Date  . Hypertension   . Diabetes mellitus   . GERD (gastroesophageal reflux disease)   . Neuromuscular disorder   . Multiple sclerosis   . Anxiety   . Hyperlipemia   . Pneumonia     Medications:  Anti-infectives     Start     Dose/Rate Route Frequency Ordered Stop   11/17/11 0600   vancomycin (VANCOCIN) 500 mg in sodium chloride 0.9 % 100 mL IVPB        500 mg 100 mL/hr over 60 Minutes Intravenous Every 12 hours 11/17/11 0032     11/16/11 2359    levofloxacin (LEVAQUIN) IVPB 750 mg        750 mg 100 mL/hr over 90 Minutes Intravenous Every 24 hours 11/16/11 2203 11/19/11 2359   11/16/11 2300   ceFEPIme (MAXIPIME) 1 g in dextrose 5 % 50 mL IVPB        1 g 100 mL/hr over 30 Minutes Intravenous 3 times per day 11/16/11 2203 11/24/11 2159   11/16/11 1615   piperacillin-tazobactam (ZOSYN) IVPB 3.375 g     Comments: Give AFTER blood cx drawn      3.375 g 12.5 mL/hr over 240 Minutes Intravenous To Emergency Dept 11/16/11 1610 11/16/11 2158   11/16/11 1615   vancomycin (VANCOCIN) IVPB 1000 mg/200 mL premix     Comments: Give AFTER blood cx drawn      1,000 mg 200 mL/hr over 60 Minutes Intravenous To Emergency Dept 11/16/11 1610 11/16/11 1742         Assessment: Patient with PNA.  Vancomycin per pharmacy ordered.  First dose of antibiotics already given in ED.  Goal of Therapy:  Vancomycin trough level 15-20 mcg/ml  Plan:  Measure antibiotic drug levels at steady state Follow up culture results Vancomycin 0.5gm iv q12hr  Aleene Davidson Crowford 11/17/2011,12:32 AM

## 2011-11-17 NOTE — Clinical Social Work Placement (Signed)
Clinical Social Work Department CLINICAL SOCIAL WORK PLACEMENT NOTE 11/17/2011  Patient:  NASIYA, PASCUAL  Account Number:  [id number] Admit date:  11/16/2011  Clinical Social Worker:  Jodelle Red  Date/time:  11/17/2011 10:14 AM  Clinical Social Work is seeking post-discharge placement for this patient at the following level of care:   SKILLED NURSING   (*CSW will update this form in Epic as items are completed)   11/17/2011  Patient/family provided with [facility] Department of Clinical Social Work's list of facilities offering this level of care within the geographic area requested by the patient (or if unable, by the patient's family).  11/17/2011  Patient/family informed of their freedom to choose among providers that offer the needed level of care, that participate in Medicare, Medicaid or managed care program needed by the patient, have an available bed and are willing to accept the patient.  11/17/2011  Patient/family informed of [facility]' ownership interest in [facility], as well as of the fact that they are under no obligation to receive care at this facility.  PASARR submitted to EDS on 11/17/2011 PASARR number received from EDS on   FL2 transmitted to all facilities in geographic area requested by pt/family on   FL2 transmitted to all facilities within larger geographic area on   Patient informed that his/her managed care company has contracts with or will negotiate with  certain facilities, including the following:     Patient/family informed of bed offers received:   Patient chooses bed at  Physician recommends and patient chooses bed at    Patient to be transferred to  on   Patient to be transferred to facility by   The following physician request were entered in Epic:   Additional Comments: Pt wants to return to [facility] where she has been a resident for over one year.  Vennie Homans, [place]            11/17/2011 10:15  AM\ #[phone]

## 2011-11-17 NOTE — Progress Notes (Signed)
Subjective: Patient states cough improving and SOB improving.  Objective: Vital signs in last 24 hours: Filed Vitals:   11/17/11 0400 11/17/11 0500 11/17/11 0600 11/17/11 0800  BP: 127/54 103/62 105/54 114/62  Pulse: 79 119 104 117  Temp:  98.8 F (37.1 C) 98.6 F (37 C) 99.1 F (37.3 C)  TempSrc:      Resp: 31 28 25 26   Height:      Weight:      SpO2: 95% 94% 93% 98%    Intake/Output Summary (Last 24 hours) at 11/17/11 0859 Last data filed at 11/17/11 0630  Gross per 24 hour  Intake    330 ml  Output    275 ml  Net     55 ml    Weight change:   General: Alert, awake, oriented x3, in no acute distress. HEENT: No bruits, no goiter. Heart: Regular rate and rhythm, without murmurs, rubs, gallops. Lungs: Coarse diffuse BS Abdomen: Soft, nontender, nondistended, positive bowel sounds. Extremities: No clubbing cyanosis or edema with positive pedal pulses. Neuro: Grossly intact, nonfocal.    Lab Results:  Basename 11/17/11 0530 11/16/11 1628  NA 137 140  K 3.2* 4.1  CL 101 102  CO2 22 20  GLUCOSE 204* 272*  BUN 26* 26*  CREATININE 0.44* 0.42*  CALCIUM 9.7 9.9  MG 1.3* --  PHOS -- --    Basename 11/17/11 0530  AST 8  ALT 5  ALKPHOS 72  BILITOT 0.2*  PROT 8.3  ALBUMIN 2.8*   No results found for this basename: LIPASE:2,AMYLASE:2 in the last 72 hours  Basename 11/17/11 0530 11/16/11 1628  WBC 15.7* 18.3*  NEUTROABS -- 16.0*  HGB 11.4* 12.6  HCT 35.3* 37.8  MCV 84.2 84.2  PLT 384 450*   No results found for this basename: CKTOTAL:3,CKMB:3,CKMBINDEX:3,TROPONINI:3 in the last 72 hours No components found with this basename: POCBNP:3 No results found for this basename: DDIMER:2 in the last 72 hours No results found for this basename: HGBA1C:2 in the last 72 hours No results found for this basename: CHOL:2,HDL:2,LDLCALC:2,TRIG:2,CHOLHDL:2,LDLDIRECT:2 in the last 72 hours No results found for this basename: TSH,T4TOTAL,FREET3,T3FREE,THYROIDAB in the last  72 hours No results found for this basename: VITAMINB12:2,FOLATE:2,FERRITIN:2,TIBC:2,IRON:2,RETICCTPCT:2 in the last 72 hours  Micro Results: Recent Results (from the past 240 hour(s))  CULTURE, RESPIRATORY     Status: Normal (Preliminary result)   Collection Time   11/16/11 11:00 PM      Component Value Range Status Comment   Specimen Description SPUTUM   Final    Special Requests NONE   Final    Gram Stain     Final    Value: FEW WBC PRESENT, PREDOMINANTLY PMN     NO SQUAMOUS EPITHELIAL CELLS SEEN     NO ORGANISMS SEEN   Culture PENDING   Incomplete    Report Status PENDING   Incomplete   CULTURE, EXPECTORATED SPUTUM-ASSESSMENT     Status: Normal   Collection Time   11/16/11 11:02 PM      Component Value Range Status Comment   Specimen Description SPUTUM EXPECTORATED   Final    Special Requests NONE   Final    Sputum evaluation     Final    Value: THIS SPECIMEN IS ACCEPTABLE. RESPIRATORY CULTURE REPORT TO FOLLOW.   Report Status 11/16/2011 FINAL   Final   MRSA PCR SCREENING     Status: Normal   Collection Time   11/17/11 12:20 AM      Component Value  Range Status Comment   MRSA by PCR NEGATIVE  NEGATIVE  Final     Studies/Results: Dg Chest Portable 1 View  11/16/2011  *RADIOLOGY REPORT*  Clinical Data: Cough, congestion, shortness of breath  PORTABLE CHEST - 1 VIEW  Comparison: Chest x-ray of 12/05/2010  Findings: There is more opacity at the right lung base suspicious for pneumonia.  A two-view chest x-ray would be helpful.  The left lung is clear.  The heart is within normal limits in size.  No bony abnormality is seen other than diffuse osteopenia.  IMPRESSION: Opacity throughout lung base suspicious for pneumonia.  Consider two-view chest x-ray.  Original Report Authenticated By: Juline Patch, M.D.    Medications:     . sodium chloride   Intravenous Once  . albuterol  2.5 mg Nebulization Q6H  . antiseptic oral rinse  15 mL Mouth Rinse BID  . aspirin EC  81 mg Oral Daily    . ceFEPime (MAXIPIME) IV  1 g Intravenous Q8H  . enoxaparin (LOVENOX) injection  40 mg Subcutaneous QHS  . furosemide  20 mg Intravenous Once  . gabapentin  100 mg Oral TID  . guaiFENesin  1,200 mg Oral BID  . insulin aspart  0-9 Units Subcutaneous Q4H  . levofloxacin (LEVAQUIN) IV  750 mg Intravenous Q24H  . metoprolol tartrate  12.5 mg Oral BID  . pantoprazole  40 mg Oral Q1200  . piperacillin-tazobactam (ZOSYN)  IV  3.375 g Intravenous To ER  . potassium chloride  40 mEq Oral Q4H  . sodium chloride  3 mL Intravenous Q12H  . vancomycin  500 mg Intravenous Q12H  . vancomycin  1,000 mg Intravenous To ER  . DISCONTD: aspirin  81 mg Oral Daily  . DISCONTD: enoxaparin  30 mg Subcutaneous Q24H  . DISCONTD: furosemide  40 mg Oral Daily    Assessment: Principal Problem:  *Sepsis Active Problems:  Acute respiratory failure  Healthcare-associated pneumonia  Quadriplegia  MS (multiple sclerosis)  Hypokalemia  Tachycardia   Plan: #1. Sepsis Likely secondary to HCAP. Patient is currently afebrile with WBC trending down. Patient however is tachycardic and on NRB face mask. Urine strep pneum antigen is negative. Blood cultures are pending. Sputum is pending. UA is negative. Will check a urine legionella antigen. Lactic acid level was 2.1. Procalcitonin is <0.10. Continue empiric IV Vancomycin, cefepime and Levaquin.  #2 acute respiratory failure Likely secondary to healthcare associated pneumonia in the setting of advanced multiple sclerosis. Sputum Gram stain and cultures pending. Blood cultures are pending. Urine strep pneum antigen  is negative. Will check a urine Legionella antigen. We'll try to wean O2. Continue empiric IV vancomycin, cefepime and Levaquin. Continue nebs.  #3 healthcare associated pneumonia Sputum Gram stain and cultures pending. Urine strep pneumococcus antigen is negative. Urine Legionella antigen is pending. WBC is trending down. Continue empiric IV vancomycin,  cefepime and Levaquin. Continue Mucinex. Speech therapy consult pending to help rule out aspiration.  #4 hypokalemia Likely secondary to a dose of diuretics given yesterday. Will check a magnesium level. Replete.  #5 tachycardia Likely secondary to sepsis. We'll cycle cardiac enzymes every 8 hours x3. Check a TSH. Check a EKG. We'll place on gentle IV hydration and follow. Continue Lopressor.  #6 multiple sclerosis/quadriplegia Supportive care. Speech consult pending to evaluate swallow.  #7 diabetes Check a hemoglobin A1c. Place on sliding scale insulin.  #8 hypertension Continue Lopressor.  #9 gastroesophageal reflux disease PPI  #10 prophylaxis Protonix for GI prophylaxis, Lovenox for  DVT prophylaxis.   LOS: 1 day   Pilot Prindle 11/17/2011, 8:59 AM

## 2011-11-17 NOTE — Progress Notes (Signed)
Speech Language Pathology Dysphagia Treatment Patient Details Name: [name] MRN: [id number] DOB: 11-11-1934 Today's Date: 11/17/2011 Time: 0865-7846 SLP Time Calculation (min): 25 min  Assessment / Plan / Recommendation Clinical Impression  SLP spoke to RN who reports pt coughing after intake, does not occur immediately after, but raises concern for possible aspiration.  Daughter reports giving pt some ice with overt coughing after swallow, but pt was reclined in bed-advised HOB elevation as much as pt can tolerate.     Intake was poor during lunch meal -few bites per pt's family - granddtr who fed pt is not present currently.  Upon further discussion,  pt admits to eating at SNF with [facility] [place] lower due to pain when sitting upright.  Pt no longer tolerates sitting in a wheelchair per family.  In addition, pt admits to caregivers feeding her rapidly at SNF, which she denied earlier during evaluation.  Family and pt educated to SLPs suspicions of ongoing aspiration of liquids x2 months given pt's report of coughing and possible pulmonary ramifications.  Pt has used thickener in the past, but disliked it, states she is willing to use it again temporarily if beneficial.    SLP educated pt and family to multiple risk factors for aspiration pna including relying on others for feeding, pt being bedridden, dysphagia, and pt's quadriparesis diminishing airway protection/lung clearance.  Concern for pt's prognosis for swallow function and asp pna risk is present given co morbidities, dysphagia x at least 2 months and quadriparesis.  Recommend repeat MBS due to possible sensory deficits with MS and to assess for OVERT aspiration.  Advised family and pt to consider a referral to establish goals of care given her progressive neurological disease. - pt stated she didn't want to talk about it yet.  Advised family to pt to use caution when eating tonight and drink water with her meals, cease intake if short of breath or  coughing.  MBS planned in am.  Unfortunately for this pt, episodic aspiration may not logistically be completely eliminated given multiple factors.  Pt agreeable to MBS and order obtained. Thanks.    Pt may be aspirating secretions she coughs from lungs or are stasised in pharynx at this point.  RN reports she has manually assisted pt to cough which is effective and RN obtained order for respiratory to NTS as needed.       Diet Recommendation   Continue mech soft/thin WATER with meals with STRICT precautions   SLP Plan MBS   Pertinent Vitals/Pain Afebrile, pt now on facemask due to desaturation   Swallowing Goals   Education ongoing  General Temperature Spikes Noted: No Respiratory Status: Supplemental O2 delivered via (comment) (now on facemask due to pt desaturation)   Dysphagia Treatment Treatment focused on: Patient/family/caregiver education Family/Caregiver Educated: Pt, daughters (twins :  [name] and [name]) Patient observed directly with PO's: No Feeding: Needs assist   Donavan Burnet, MS [facility] SLP [phone]

## 2011-11-17 NOTE — Evaluation (Signed)
Clinical/Bedside Swallow Evaluation Patient Details  Name: [name] MRN: [id number] Date of Birth: 08-13-1934  Today's Date: 11/17/2011 Time: 4098-1191 SLP Time Calculation (min): 64 min  Past Medical History:  Past Medical History  Diagnosis Date  . Hypertension   . Diabetes mellitus   . GERD (gastroesophageal reflux disease)   . Neuromuscular disorder   . Multiple sclerosis   . Anxiety   . Hyperlipemia   . Pneumonia    Past Surgical History:  Past Surgical History  Procedure Date  . Tubal ligation   . Flexible sigmoidoscopy 10/05/2011    Procedure: FLEXIBLE SIGMOIDOSCOPY;  Surgeon: Vertell Novak., MD;  Location: [facility];  Service: Endoscopy;  Laterality: N/A;   pt. coming from [facility]-[phone] daughter phone no. [phone],daughter might come with her  sedation is needed, pt. is paraplegic   HPI:  77 yo female with h/o MS with quadriplegia admitted to [facility] with shortness of breath and decr oxygen saturation- + right lower lobe pna.    Pt had a recent diagnosis of pna and was treated with antibiotics.  PMH also + for GERD, HTN, pna approx 2 years ago, hyperlipidemia, anxiety, DM.  Pt is a resident of [facility].  Medication list includes lasix, neurontin, mucinex, pantoprazole,.  Pt also with h/o smoking 1 ppd for 40-50 years per previous notes - stopped over a decade ago.  Swallow evaluation was ordered due to concerns pt may be aspirating.  Pt reports recent dysphagia to liquid more than solids causing her to cough within the last 2 months, she also acknowledges very recent problems with senses food stasis in pharynx that clears with liquids.  Pt has used thickener in the past (4 years ago per pt) but disliked it and stopped using it on her own.     Assessment / Plan / Recommendation Clinical Impression  Pt presents with overt clinical s/s of aspiration with thin water via straw characterized by WEAK cough.  - Suspect aspiration occured due to decr  laryngeal elevation/closure and/or premature spillage of liquids into pharynx/larynx/trachea due to decr oral/lingual control.  Swallow appeared timely with all consistencies, but laryngeal elevation (palpated at bedside) appeared diminished.  No clinical s/s of aspiration with SMALL boluses of thin via cup, pureed, or crackers.  Pt reports problems swallowing x2 months - coughing with liquids and sensing food stsis in pharynx clearing with liquids.  Recommend initiate a soft diet and water only with meals with strict precautions.  As pt relies on others for feeding and is quadriplegic with MS, her aspiration pna risk is elevated.   SLP called daughter to ask her to bring denture adhesive in for pt.  Also advised pt to start making decision and doing research re: feeding options if dysphagia progresses to where she is aspirating all po given her MS - ie:  feeding tube vs po before emergent situation arises.       Aspiration Risk    Moderate and chronic given pt's MS   Diet Recommendation Dysphagia 3 (Mechanical Soft);Thin liquid (water with meals -)   Other  Recommendations MBS (MBS if pt not tolerating modified diet)   Follow Up Recommendations  Skilled Nursing facility    Frequency and Duration min 2x/week  2 weeks   Pertinent Vitals/Pain Stable    SLP Swallow Goals Patient will consume recommended diet without observed clinical signs of aspiration with: Supervision/safety Patient will utilize recommended strategies during swallow to increase swallowing safety with: Supervision/safety   Swallow  Study Prior Functional Status   Relied on others for feeding, resident of SNF, on mechanical soft/thin diet with meds given with puree.  Not seeing SLP at SNF per SLP conversation with Malvin Johns SLP.     General HPI: 77 yo female with h/o MS with quadriplegia admitted to [facility] with shortness of breath and decr oxygen saturation- + right lower lobe pna.    Pt had a recent diagnosis of pna and was  treated with antibiotics.  PMH also + for GERD, HTN, pna approx 2 years ago, hyperlipidemia, anxiety, DM.  Pt is a resident of [facility].  Medication list includes lasix, neurontin, mucinex, pantoprazole,.  Pt also with h/o smoking 1 ppd for 40-50 years per previous notes - stopped over a decade ago.  Swallow evaluation was ordered due to concerns pt may be aspirating.  Pt reports recent dysphagia to liquid more than solids causing her to cough within the last 2 months, she also acknowledges very recent problems with senses food stasis in pharynx that clears with liquids.  Pt has used thickener in the past (4 years ago per pt) but disliked it and stopped using it on her own.   Type of Study: Bedside swallow evaluation Previous Swallow Assessment: MBS 09/08/2009 recommended puree/thin with chin tuck - pt did not have dentures at the time Diet Prior to this Study: NPO Temperature Spikes Noted: No Respiratory Status: Supplemental O2 delivered via (comment) (6 liters nasal cannula- was on nonrebreather prior) History of Intubation: No Behavior/Cognition: Alert;Cooperative;Pleasant mood Oral Cavity - Dentition: Dentures, top;Dentures, bottom (ill fitting-adhesive applied) Patient Positioning: Upright in bed Baseline Vocal Quality: Clear;Low vocal intensity Volitional Cough: Other (Comment) (absent volitional cough suspected d/t MS) Volitional Swallow: Able to elicit    Oral/Motor/Sensory Function Overall Oral Motor/Sensory Function: Appears within functional limits for tasks assessed (mild generalized weakness)   Ice Chips Ice chips: Not tested   Thin Liquid Thin Liquid: Impaired Presentation: Cup;Spoon;Straw Pharyngeal  Phase Impairments: Cough - Immediate;Decreased hyoid-laryngeal movement Other Comments: overt cough when using straw, small tsps and cup sips were tolerated without cough    Nectar Thick Nectar Thick Liquid: Impaired Presentation: Cup;Straw;Spoon Pharyngeal Phase Impairments:  Decreased hyoid-laryngeal movement   Honey Thick Honey Thick Liquid: Not tested   Puree Puree: Impaired Presentation: Spoon Pharyngeal Phase Impairments: Decreased hyoid-laryngeal movement   Solid Solid: Impaired Pharyngeal Phase Impairments: Decreased hyoid-laryngeal movement Other Comments: slow mastication appeared functional for pt    Brianna Burnet, MS [facility] SLP [phone]

## 2011-11-17 NOTE — Progress Notes (Signed)
Dr. Janee Morn notified by phone that pt desats into mid 80's on [place] 6 LPM and can cough up lg amts thick tan mucus if pressure applied to support  abd muscles.  Pt's cough effort is very weak. Placed on Venti mask at 50 %.Anthonette Legato

## 2011-11-17 NOTE — Clinical Social Work Psychosocial (Signed)
Clinical Social Work Department BRIEF PSYCHOSOCIAL ASSESSMENT 11/17/2011  Patient:  [name], [name]     Account Number:  [id number]     Admit date:  11/16/2011  Clinical Social Worker:  Jodelle Red  Date/Time:  11/17/2011 10:01 AM  Referred by:  Physician  Date Referred:  11/17/2011 Referred for  SNF Placement   Other Referral:   Admitted from SNF   Interview type:  Patient Other interview type:    PSYCHOSOCIAL DATA Living Status:  FACILITY Admitted from facility:  [facility] Level of care:  Skilled Nursing Facility Primary support name:  York Cerise Primary support relationship to patient:  CHILD, ADULT Degree of support available:   Good from daughter, [phone]- VM not set up    CURRENT CONCERNS Current Concerns  Post-Acute Placement  Financial Resources   Other Concerns:    SOCIAL WORK ASSESSMENT / PLAN CSW spoke with Pt to check in due to Pt being admitted from SNF. Pt from [facility]. Pt states she has been there for over a year and would like to return. CSW left message for facility rep to inquire about this. Pt disclosed to CSW that she had some concerns about paying for her care at SNF and was curious about LTC medicaid. CSW explained the process to apply. Pt stated she would consider that when she "gets better". CSW will mentionthis resource to daughter as well. CSW attempted to contact daughter, but her VM is not set up yet.   Assessment/plan status:  Psychosocial Support/Ongoing Assessment of Needs Other assessment/ plan:   Assist with return to SNF   Information/referral to community resources:   DSS for medicaid    PATIENT'S/FAMILY'S RESPONSE TO PLAN OF CARE: Pt appreciative. CSW will follow for support and assist with return to SNF.     Vennie Homans, [place] 11/17/2011 10:13 AM [phone]

## 2011-11-17 NOTE — Progress Notes (Signed)
INITIAL ADULT NUTRITION ASSESSMENT Date: 11/17/2011   Time: 2:23 PM Reason for Assessment: Nutrition Risk for unintentional weight loss > 10 lb over 1 month and dysphagia  ASSESSMENT: Female 77 y.o.  Dx: Sepsis  Hx:  Past Medical History  Diagnosis Date  . Hypertension   . Diabetes mellitus   . GERD (gastroesophageal reflux disease)   . Neuromuscular disorder   . Multiple sclerosis   . Anxiety   . Hyperlipemia   . Pneumonia     Related Meds:  Scheduled Meds:   . sodium chloride   Intravenous Once  . albuterol  2.5 mg Nebulization Q6H  . antiseptic oral rinse  15 mL Mouth Rinse BID  . aspirin EC  81 mg Oral Daily  . ceFEPime (MAXIPIME) IV  1 g Intravenous Q8H  . enoxaparin (LOVENOX) injection  40 mg Subcutaneous QHS  . furosemide  20 mg Intravenous Once  . gabapentin  100 mg Oral TID  . guaiFENesin  1,200 mg Oral BID  . insulin aspart  0-9 Units Subcutaneous Q4H  . levofloxacin (LEVAQUIN) IV  750 mg Intravenous Q24H  . magnesium sulfate 1 - 4 g bolus IVPB  3 g Intravenous Once  . metoprolol tartrate  12.5 mg Oral BID  . pantoprazole  40 mg Oral Q1200  . piperacillin-tazobactam (ZOSYN)  IV  3.375 g Intravenous To ER  . potassium chloride  40 mEq Oral Q4H  . sodium chloride  3 mL Intravenous Q12H  . vancomycin  500 mg Intravenous Q12H  . vancomycin  1,000 mg Intravenous To ER  . DISCONTD: aspirin  81 mg Oral Daily  . DISCONTD: enoxaparin  30 mg Subcutaneous Q24H  . DISCONTD: furosemide  40 mg Oral Daily   Continuous Infusions:   . sodium chloride 75 mL/hr at 11/17/11 0846   PRN Meds:.acetaminophen, acetaminophen, albuterol, ALPRAZolam, ondansetron (ZOFRAN) IV, ondansetron   Ht: 5\' 5"  (165.1 cm)  Wt: 110 lb 7.2 oz (50.1 kg)  Ideal Wt: 51.13 kg % Ideal Wt: 97.7%  Wt Readings from Last 10 Encounters:  11/16/11 110 lb 7.2 oz (50.1 kg)  10/05/11 118 lb (53.524 kg)  10/05/11 118 lb (53.524 kg)  *Unintentional weight loss of 8 lb over 1.5 months, 6.7% weight  loss from baseline.   Usual Wt: 135 per patient a few months ago % Usual Wt: 81.4%  Body mass index is 18.38 kg/(m^2). (Underweight)  Food/Nutrition Related Hx: Patient reported appetite has been poor. She reported she has not been eating well for the last month, intake about 50% of her meals. She needs feeding assistance with meals. Patient declines nutrition supplement or snacks but agreed to try Ensure pudding.   Labs:  CMP     Component Value Date/Time   NA 137 11/17/2011 0530   K 3.2* 11/17/2011 0530   CL 101 11/17/2011 0530   CO2 22 11/17/2011 0530   GLUCOSE 204* 11/17/2011 0530   BUN 26* 11/17/2011 0530   CREATININE 0.44* 11/17/2011 0530   CALCIUM 9.7 11/17/2011 0530   PROT 8.3 11/17/2011 0530   ALBUMIN 2.8* 11/17/2011 0530   AST 8 11/17/2011 0530   ALT 5 11/17/2011 0530   ALKPHOS 72 11/17/2011 0530   BILITOT 0.2* 11/17/2011 0530   GFRNONAA >90 11/17/2011 0530   GFRAA >90 11/17/2011 0530    Intake/Output Summary (Last 24 hours) at 11/17/11 1429 Last data filed at 11/17/11 1100  Gross per 24 hour  Intake    781 ml  Output  315 ml  Net    466 ml     Diet Order: Dysphagia  Supplements/Tube Feeding: none at this time  IVF:    sodium chloride Last Rate: 75 mL/hr at 11/17/11 0846    Estimated Nutritional Needs:   Kcal: 1610-9604 Protein: 61.36-76 grams Fluid: 1 ml per kcal intake  NUTRITION DIAGNOSIS: -Inadequate oral intake (NI-2.1).  Status: Ongoing  -Underweight r/t low weight for height a/e/b BMI of 18.3.   RELATED TO: poor appetite  AS EVIDENCE BY: patient reports poor appetite and PO intake less than 50% at meals for the past month.   MONITORING/EVALUATION(Goals): PO intake, weight trends, labs 1. PO intake > 75% at meals. 2. Minimize weight loss.   EDUCATION NEEDS: -No education needs identified at this time  INTERVENTION: 1. Will order patient Ensure pudding once daily to increase intake.  2. RD to follow for nutrition plan of care.   Dietitian  [phone]  DOCUMENTATION CODES Per approved criteria  -Severe malnutrition in the context of acute illness or injury  * Meets criteria for severe malnutrition in the context of acute illness or injury due to intake less than or equal to 50% of estimated energy requirements for greater than or equal to 5 days and unintentional weight loss of greater than 5% from baseline over 1 month.   Iven Finn [facility] 11/17/2011, 2:23 PM

## 2011-11-18 ENCOUNTER — Inpatient Hospital Stay (HOSPITAL_COMMUNITY): Payer: Medicare Other

## 2011-11-18 DIAGNOSIS — R946 Abnormal results of thyroid function studies: Secondary | ICD-10-CM

## 2011-11-18 DIAGNOSIS — A413 Sepsis due to Hemophilus influenzae: Secondary | ICD-10-CM

## 2011-11-18 DIAGNOSIS — R7989 Other specified abnormal findings of blood chemistry: Secondary | ICD-10-CM | POA: Diagnosis present

## 2011-11-18 DIAGNOSIS — D696 Thrombocytopenia, unspecified: Secondary | ICD-10-CM

## 2011-11-18 DIAGNOSIS — J9600 Acute respiratory failure, unspecified whether with hypoxia or hypercapnia: Secondary | ICD-10-CM

## 2011-11-18 LAB — CBC
HCT: 32.2 % — ABNORMAL LOW (ref 36.0–46.0)
MCHC: 32.6 g/dL (ref 30.0–36.0)
Platelets: 404 10*3/uL — ABNORMAL HIGH (ref 150–400)
RDW: 15.5 % (ref 11.5–15.5)
WBC: 12.5 10*3/uL — ABNORMAL HIGH (ref 4.0–10.5)

## 2011-11-18 LAB — GLUCOSE, CAPILLARY
Glucose-Capillary: 176 mg/dL — ABNORMAL HIGH (ref 70–99)
Glucose-Capillary: 132 mg/dL — ABNORMAL HIGH (ref 70–99)

## 2011-11-18 LAB — T3, FREE: T3, Free: 2.2 pg/mL — ABNORMAL LOW (ref 2.3–4.2)

## 2011-11-18 LAB — T3: T3, Total: 68.7 ng/dl — ABNORMAL LOW (ref 80.0–204.0)

## 2011-11-18 LAB — DIFFERENTIAL
Basophils Absolute: 0 10*3/uL (ref 0.0–0.1)
Lymphocytes Relative: 13 % (ref 12–46)
Lymphs Abs: 1.7 10*3/uL (ref 0.7–4.0)
Neutro Abs: 9.7 10*3/uL — ABNORMAL HIGH (ref 1.7–7.7)
Neutrophils Relative %: 77 % (ref 43–77)

## 2011-11-18 LAB — CARDIAC PANEL(CRET KIN+CKTOT+MB+TROPI)
Relative Index: INVALID (ref 0.0–2.5)
Total CK: 47 U/L (ref 7–177)
Troponin I: <0.3 ng/mL (ref <0.30)

## 2011-11-18 LAB — MAGNESIUM: Magnesium: 1.9 mg/dL (ref 1.5–2.5)

## 2011-11-18 LAB — BASIC METABOLIC PANEL
BUN: 19 mg/dL (ref 6–23)
Chloride: 107 mEq/L (ref 96–112)
GFR calc Af Amer: >90 mL/min (ref >90)
GFR calc non Af Amer: >90 mL/min (ref >90)
Potassium: 4.6 mEq/L (ref 3.5–5.1)
Sodium: 138 mEq/L (ref 135–145)

## 2011-11-18 LAB — T4, FREE: Free T4: 1.67 ng/dL (ref 0.80–1.80)

## 2011-11-18 MED ORDER — BIOTENE DRY MOUTH MT LIQD: 15.0000 mL | Freq: Two times a day (BID) | OROMUCOSAL | Status: DC

## 2011-11-18 MED ORDER — CHLORHEXIDINE GLUCONATE 0.12 % MT SOLN
15.0000 mL | Freq: Two times a day (BID) | OROMUCOSAL | Status: DC
  Administered 2011-11-18 – 2011-11-23 (×12): 15 mL via OROMUCOSAL
  Filled 2011-11-18 (×15): qty 15

## 2011-11-18 MED ORDER — IBUPROFEN 600 MG PO TABS
600.0000 mg | ORAL_TABLET | Freq: Four times a day (QID) | ORAL | Status: DC | PRN
  Administered 2011-11-18 – 2011-11-24 (×5): 600 mg via ORAL
  Filled 2011-11-18 (×6): qty 1

## 2011-11-18 MED ORDER — METOPROLOL TARTRATE 25 MG PO TABS
25.0000 mg | ORAL_TABLET | Freq: Two times a day (BID) | ORAL | Status: DC
  Administered 2011-11-18 – 2011-11-24 (×13): 25 mg via ORAL
  Filled 2011-11-18 (×14): qty 1

## 2011-11-18 NOTE — Procedures (Signed)
Objective Swallowing Evaluation: Modified Barium Swallowing Study  Patient Details  Name: [name] MRN: [id number] Date of Birth: 08-05-1934  Today's Date: 11/18/2011 Time: 2130-8657 SLP Time Calculation (min): 25 min  Past Medical History:  Past Medical History  Diagnosis Date  . Hypertension   . Diabetes mellitus   . GERD (gastroesophageal reflux disease)   . Neuromuscular disorder   . Multiple sclerosis   . Anxiety   . Hyperlipemia   . Pneumonia    Past Surgical History:  Past Surgical History  Procedure Date  . Tubal ligation   . Flexible sigmoidoscopy 10/05/2011    Procedure: FLEXIBLE SIGMOIDOSCOPY;  Surgeon: Vertell Novak., MD;  Location: [facility];  Service: Endoscopy;  Laterality: N/A;   pt. coming from [phone] daughter phone no. [phone],daughter might come with her  sedation is needed, pt. is paraplegic   HPI: See bedside swallow       Assessment / Plan / Recommendation Clinical Impression  Clinical impression: Pt presents with only minimal dysphagia without aspiration or penetration of any consistency tested.  Delayed pharyngeal swallow initiation noted with first bolus of thin (tsp) to pyriform sinus - this did not occur again therefore ? warmup effect.  Also mild pharyngeal stasis with thin via straw that pt did NOT sense cleared with CUED dry swallow.  Recommend continue current diet, but if pt is functionally coughing or demonstrating difficulties with meals -  diet modification may be indicated.  Asp risk will be chronic due to pt's MS, reliance on others for feeding, poor positioning, quadriparesis with lack of effective cough to clear secretions and or aspirates, and mild dysphagia.  Continue to suspect episodic aspiration is present.  SLP to follow.  Thanks.     Treatment Recommendation  Therapy as outlined in treatment plan below    Diet Recommendation Dysphagia 3 (Mechanical Soft);Thin liquid (water with meals)   Other   Recommendations     Follow Up Recommendations  Skilled Nursing facility    Frequency and Duration min 2x/week  2 weeks   Pertinent Vitals/Pain Six liters of oxygen    SLP Swallow Goals Swallow Study Goal #1 - Progress: Progressing toward goal Swallow Study Goal #2 - Progress: Progressing toward goal   General Date of Onset: 11/18/11 Type of Study: Modified Barium Swallowing Study Temperature Spikes Noted: No Respiratory Status: Supplemental O2 delivered via (comment) (six liters oxygen) Behavior/Cognition: Alert;Cooperative;Pleasant mood Oral Cavity - Dentition: Dentures, bottom;Dentures, top Oral Motor / Sensory Function: Impaired - see Bedside swallow eval Vision:  (pt is a quad unable to self feed) Patient Positioning: Postural control interferes with function (due to pain) Baseline Vocal Quality: Clear;Low vocal intensity Volitional Swallow: Able to elicit Anatomy: Within functional limits Pharyngeal Secretions: Not observed secondary MBS    Reason for Referral Concern for aspiration  Oral Phase   Mildly impaired  Pharyngeal Phase Pharyngeal Phase: Impaired   Cervical Esophageal Phase Cervical Esophageal Phase: Leonarda Salon    Chales Abrahams 11/18/2011, 12:38 PM

## 2011-11-18 NOTE — Progress Notes (Signed)
Speech Pathology Note  MBS scheduled for today at approximately 11 am.  RN aware.  Full report to follow.   Donavan Burnet, MS [facility] SLP    [phone]

## 2011-11-18 NOTE — Progress Notes (Signed)
Subjective: Patient states cough improving and SOB improving. No complaints. Wants to go home.  Objective: Vital signs in last 24 hours: Filed Vitals:   11/18/11 0500 11/18/11 0600 11/18/11 0800 11/18/11 0840  BP: 109/49 114/49    Pulse: 101 104    Temp: 98.8 F (37.1 C) 99 F (37.2 C) 98.2 F (36.8 C)   TempSrc:   Core (Comment)   Resp: 25 29    Height:  5\' 5"  (1.651 m)    Weight:  51.8 kg (114 lb 3.2 oz)    SpO2: 94% 97%  96%    Intake/Output Summary (Last 24 hours) at 11/18/11 1003 Last data filed at 11/18/11 0900  Gross per 24 hour  Intake   2330 ml  Output    695 ml  Net   1635 ml    Weight change: 1.7 kg (3 lb 12 oz)  General: Alert, awake, oriented x3, in no acute distress.Quadraplegia HEENT: No bruits, no goiter. Heart: Tachycardia, without murmurs, rubs, gallops. Lungs: Coarse diffuse BS Abdomen: Soft, nontender, nondistended, positive bowel sounds. Extremities: No clubbing cyanosis or edema with positive pedal pulses. Neuro: Grossly intact, nonfocal.     Lab Results:  Basename 11/18/11 0335 11/17/11 0530  NA 138 137  K 4.6 3.2*  CL 107 101  CO2 23 22  GLUCOSE 160* 204*  BUN 19 26*  CREATININE 0.31* 0.44*  CALCIUM 9.7 9.7  MG 1.9 1.3*  PHOS -- --    Basename 11/17/11 0530  AST 8  ALT 5  ALKPHOS 72  BILITOT 0.2*  PROT 8.3  ALBUMIN 2.8*   No results found for this basename: LIPASE:2,AMYLASE:2 in the last 72 hours  Basename 11/18/11 0335 11/17/11 0530 11/16/11 1628  WBC 12.5* 15.7* --  NEUTROABS 9.7* -- 16.0*  HGB 10.5* 11.4* --  HCT 32.2* 35.3* --  MCV 85.2 84.2 --  PLT 404* 384 --    Basename 11/18/11 0055 11/17/11 1730 11/17/11 0942  CKTOTAL 47 20 21  CKMB 0.8 1.0 1.1  CKMBINDEX -- -- --  TROPONINI <0.30 <0.30 <0.30   No components found with this basename: POCBNP:3 No results found for this basename: DDIMER:2 in the last 72 hours  Basename 11/17/11 0942  HGBA1C 6.7*   No results found for this basename:  CHOL:2,HDL:2,LDLCALC:2,TRIG:2,CHOLHDL:2,LDLDIRECT:2 in the last 72 hours  Basename 11/17/11 0942  TSH 0.029*  T4TOTAL --  T3FREE --  THYROIDAB --   No results found for this basename: VITAMINB12:2,FOLATE:2,FERRITIN:2,TIBC:2,IRON:2,RETICCTPCT:2 in the last 72 hours  Micro Results: Recent Results (from the past 240 hour(s))  CULTURE, BLOOD (ROUTINE X 2)     Status: Normal (Preliminary result)   Collection Time   11/16/11  4:27 PM      Component Value Range Status Comment   Specimen Description BLOOD RIGHT ARM   Final    Special Requests BOTTLES DRAWN AEROBIC AND ANAEROBIC   Final    Culture  Setup Time [id number]   Final    Culture     Final    Value:        BLOOD CULTURE RECEIVED NO GROWTH TO DATE CULTURE WILL BE HELD FOR 5 DAYS BEFORE ISSUING A FINAL NEGATIVE REPORT   Report Status PENDING   Incomplete   CULTURE, BLOOD (ROUTINE X 2)     Status: Normal (Preliminary result)   Collection Time   11/16/11  4:35 PM      Component Value Range Status Comment   Specimen Description BLOOD LEFT WRIST   Final  Special Requests BOTTLES DRAWN AEROBIC AND ANAEROBIC   Final    Culture  Setup Time [id number]   Final    Culture     Final    Value:        BLOOD CULTURE RECEIVED NO GROWTH TO DATE CULTURE WILL BE HELD FOR 5 DAYS BEFORE ISSUING A FINAL NEGATIVE REPORT   Report Status PENDING   Incomplete   CULTURE, RESPIRATORY     Status: Normal (Preliminary result)   Collection Time   11/16/11 11:00 PM      Component Value Range Status Comment   Specimen Description SPUTUM   Final    Special Requests NONE   Final    Gram Stain     Final    Value: FEW WBC PRESENT, PREDOMINANTLY PMN     NO SQUAMOUS EPITHELIAL CELLS SEEN     NO ORGANISMS SEEN   Culture PENDING   Incomplete    Report Status PENDING   Incomplete   CULTURE, EXPECTORATED SPUTUM-ASSESSMENT     Status: Normal   Collection Time   11/16/11 11:02 PM      Component Value Range Status Comment   Specimen Description SPUTUM EXPECTORATED    Final    Special Requests NONE   Final    Sputum evaluation     Final    Value: THIS SPECIMEN IS ACCEPTABLE. RESPIRATORY CULTURE REPORT TO FOLLOW.   Report Status 11/16/2011 FINAL   Final   MRSA PCR SCREENING     Status: Normal   Collection Time   11/17/11 12:20 AM      Component Value Range Status Comment   MRSA by PCR NEGATIVE  NEGATIVE  Final     Studies/Results: Dg Chest Portable 1 View  11/16/2011  *RADIOLOGY REPORT*  Clinical Data: Cough, congestion, shortness of breath  PORTABLE CHEST - 1 VIEW  Comparison: Chest x-ray of 12/05/2010  Findings: There is more opacity at the right lung base suspicious for pneumonia.  A two-view chest x-ray would be helpful.  The left lung is clear.  The heart is within normal limits in size.  No bony abnormality is seen other than diffuse osteopenia.  IMPRESSION: Opacity throughout lung base suspicious for pneumonia.  Consider two-view chest x-ray.  Original Report Authenticated By: Juline Patch, M.D.    Medications:     . albuterol  2.5 mg Nebulization Q6H  . antiseptic oral rinse  15 mL Mouth Rinse BID  . aspirin EC  81 mg Oral Daily  . ceFEPime (MAXIPIME) IV  1 g Intravenous Q8H  . chlorhexidine  15 mL Mouth Rinse BID  . enoxaparin (LOVENOX) injection  40 mg Subcutaneous QHS  . feeding supplement  1 Container Oral QAC supper  . gabapentin  100 mg Oral TID  . guaiFENesin  1,200 mg Oral BID  . insulin aspart  0-9 Units Subcutaneous Q4H  . levofloxacin (LEVAQUIN) IV  750 mg Intravenous Q24H  . magnesium sulfate 1 - 4 g bolus IVPB  3 g Intravenous Once  . metoprolol tartrate  25 mg Oral BID  . pantoprazole  40 mg Oral Q1200  . potassium chloride  40 mEq Oral Q4H  . sodium chloride  3 mL Intravenous Q12H  . vancomycin  500 mg Intravenous Q12H  . vitamin A & D      . DISCONTD: antiseptic oral rinse  15 mL Mouth Rinse q12n4p  . DISCONTD: metoprolol tartrate  12.5 mg Oral BID    Assessment: Principal Problem:  *  Sepsis Active Problems:   Acute respiratory failure  Healthcare-associated pneumonia  Quadriplegia  MS (multiple sclerosis)  Hypokalemia  Tachycardia  Low TSH level   Plan: #1. Sepsis Likely secondary to HCAP. Patient is currently afebrile with WBC trending down. Patient however is tachycardic and on 5L [place]. Urine strep pneum antigen is negative. Blood cultures are pending. Sputum is pending. UA is negative. Urine legionella antigen is negative. Lactic acid level was 2.1. Procalcitonin is <0.10. Continue empiric IV Vancomycin, cefepime and Levaquin.  #2 acute respiratory failure Likely secondary to healthcare associated pneumonia in the setting of advanced multiple sclerosis. Sputum Gram stain and cultures pending. Blood cultures are pending. Urine strep pneum antigen  is negative. Urine Legionella antigen is negative. We'll try to wean O2. Continue empiric IV vancomycin, cefepime and Levaquin. Continue nebs. Continue chest PT.  #3 healthcare associated pneumonia Sputum Gram stain and cultures pending. Urine strep pneumococcus antigen is negative. Urine Legionella antigen is negative. WBC is trending down. Continue empiric IV vancomycin, cefepime and Levaquin. Continue Mucinex. Continue nebs, chest PT. Patient for MBS today. Speech therapy ff.  #4 hypokalemia Likely secondary to a dose of diuretics given yesterday. Repleted.  #5 tachycardia Likely secondary to sepsis vs hyperthyroidism. Cardiac enzymes negative x3. Check a TSH is low at 0.029, will check a free T4 and T3 . Increase Lopressor to 25mg  BID.  #6 multiple sclerosis/quadriplegia Supportive care. Patient for MBS today. Speech therapy ff.  #7 Well controlled diabetes Hemoglobin A1c = 6.7. Continue sliding scale insulin.  #8 hypertension Continue Lopressor.  #9 Low TSH Check free T4 and T3. Increase low pressor.  #10 gastroesophageal reflux disease PPI  #11 prophylaxis Protonix for GI prophylaxis, Lovenox for DVT prophylaxis.   LOS: 2 days    Dontrey Snellgrove [phone] p 11/18/2011, 10:03 AM

## 2011-11-19 DIAGNOSIS — R946 Abnormal results of thyroid function studies: Secondary | ICD-10-CM

## 2011-11-19 DIAGNOSIS — A413 Sepsis due to Hemophilus influenzae: Secondary | ICD-10-CM

## 2011-11-19 DIAGNOSIS — J9600 Acute respiratory failure, unspecified whether with hypoxia or hypercapnia: Secondary | ICD-10-CM

## 2011-11-19 DIAGNOSIS — D696 Thrombocytopenia, unspecified: Secondary | ICD-10-CM

## 2011-11-19 DIAGNOSIS — D649 Anemia, unspecified: Secondary | ICD-10-CM

## 2011-11-19 LAB — VITAMIN B12: Vitamin B-12: 529 pg/mL (ref 211–911)

## 2011-11-19 LAB — FERRITIN: Ferritin: 124 ng/mL (ref 10–291)

## 2011-11-19 LAB — CBC
MCH: 26.6 pg (ref 26.0–34.0)
MCHC: 31.1 g/dL (ref 30.0–36.0)
Platelets: 346 10*3/uL (ref 150–400)
RBC: 3.64 MIL/uL — ABNORMAL LOW (ref 3.87–5.11)
RDW: 15.5 % (ref 11.5–15.5)

## 2011-11-19 LAB — GLUCOSE, CAPILLARY
Glucose-Capillary: 115 mg/dL — ABNORMAL HIGH (ref 70–99)
Glucose-Capillary: 174 mg/dL — ABNORMAL HIGH (ref 70–99)
Glucose-Capillary: 192 mg/dL — ABNORMAL HIGH (ref 70–99)
Glucose-Capillary: 229 mg/dL — ABNORMAL HIGH (ref 70–99)

## 2011-11-19 LAB — BASIC METABOLIC PANEL
CO2: 22 mEq/L (ref 19–32)
Calcium: 9.7 mg/dL (ref 8.4–10.5)
GFR calc Af Amer: >90 mL/min (ref >90)
GFR calc non Af Amer: >90 mL/min (ref >90)
Sodium: 140 mEq/L (ref 135–145)

## 2011-11-19 LAB — CULTURE, RESPIRATORY W GRAM STAIN: Culture: NORMAL

## 2011-11-19 NOTE — Progress Notes (Addendum)
ANTIBIOTIC CONSULT NOTE - FOLLOW UP  Pharmacy Consult for Vancomycin Indication: pneumonia  No Known Allergies  Patient Measurements: Height: 5\' 5"  (165.1 cm) Weight: 117 lb 8.1 oz (53.3 kg) IBW/kg (Calculated) : 57   Vital Signs: Temp: 98.8 F (37.1 C) (05/11 1300) Temp src: Core (Comment) (05/11 0813) BP: 117/53 mmHg (05/11 1300) Pulse Rate: 81  (05/11 1300) Intake/Output from previous day: 05/10 0701 - 05/11 0700 In: 2510 [P.O.:400; I.V.:1760; IV Piggyback:350] Out: 1315 [Urine:1315] Intake/Output from this shift: Total I/O In: 615 [P.O.:240; I.V.:375] Out: 150 [Urine:150]  Labs:  [facility] 11/19/11 0545 11/18/11 0335 11/17/11 0530  WBC 9.3 12.5* 15.7*  HGB 9.7* 10.5* 11.4*  PLT 346 404* 384  LABCREA -- -- --  CREATININE 0.26* 0.31* 0.44*   Estimated Creatinine Clearance: 51.9 ml/min (by C-G formula based on Cr of 0.26). No results found for this basename: VANCOTROUGH:2,VANCOPEAK:2,VANCORANDOM:2,GENTTROUGH:2,GENTPEAK:2,GENTRANDOM:2,TOBRATROUGH:2,TOBRAPEAK:2,TOBRARND:2,AMIKACINPEAK:2,AMIKACINTROU:2,AMIKACIN:2, in the last 72 hours   Microbiology: Recent Results (from the past 720 hour(s))  CULTURE, BLOOD (ROUTINE X 2)     Status: Normal (Preliminary result)   Collection Time   11/16/11  4:27 PM      Component Value Range Status Comment   Specimen Description BLOOD RIGHT ARM   Final    Special Requests BOTTLES DRAWN AEROBIC AND ANAEROBIC   Final    Culture  Setup Time [id number]   Final    Culture     Final    Value:        BLOOD CULTURE RECEIVED NO GROWTH TO DATE CULTURE WILL BE HELD FOR 5 DAYS BEFORE ISSUING A FINAL NEGATIVE REPORT   Report Status PENDING   Incomplete   CULTURE, BLOOD (ROUTINE X 2)     Status: Normal (Preliminary result)   Collection Time   11/16/11  4:35 PM      Component Value Range Status Comment   Specimen Description BLOOD LEFT WRIST   Final    Special Requests BOTTLES DRAWN AEROBIC AND ANAEROBIC   Final    Culture  Setup Time  [id number]   Final    Culture     Final    Value:        BLOOD CULTURE RECEIVED NO GROWTH TO DATE CULTURE WILL BE HELD FOR 5 DAYS BEFORE ISSUING A FINAL NEGATIVE REPORT   Report Status PENDING   Incomplete   CULTURE, RESPIRATORY     Status: Normal   Collection Time   11/16/11 11:00 PM      Component Value Range Status Comment   Specimen Description SPUTUM   Final    Special Requests NONE   Final    Gram Stain     Final    Value: FEW WBC PRESENT, PREDOMINANTLY PMN     NO SQUAMOUS EPITHELIAL CELLS SEEN     NO ORGANISMS SEEN   Culture NORMAL OROPHARYNGEAL FLORA   Final    Report Status 11/19/2011 FINAL   Final   CULTURE, EXPECTORATED SPUTUM-ASSESSMENT     Status: Normal   Collection Time   11/16/11 11:02 PM      Component Value Range Status Comment   Specimen Description SPUTUM EXPECTORATED   Final    Special Requests NONE   Final    Sputum evaluation     Final    Value: THIS SPECIMEN IS ACCEPTABLE. RESPIRATORY CULTURE REPORT TO FOLLOW.   Report Status 11/16/2011 FINAL   Final   MRSA PCR SCREENING     Status: Normal   Collection Time   11/17/11  12:20 AM      Component Value Range Status Comment   MRSA by PCR NEGATIVE  NEGATIVE  Final     Anti-infectives     Start     Dose/Rate Route Frequency Ordered Stop   11/17/11 0600   vancomycin (VANCOCIN) 500 mg in sodium chloride 0.9 % 100 mL IVPB        500 mg 100 mL/hr over 60 Minutes Intravenous Every 12 hours 11/17/11 0032     11/16/11 2359   levofloxacin (LEVAQUIN) IVPB 750 mg        750 mg 100 mL/hr over 90 Minutes Intravenous Every 24 hours 11/16/11 2203 11/19/11 0123   11/16/11 2300   ceFEPIme (MAXIPIME) 1 g in dextrose 5 % 50 mL IVPB        1 g 100 mL/hr over 30 Minutes Intravenous 3 times per day 11/16/11 2203 11/24/11 2159   11/16/11 1615  piperacillin-tazobactam (ZOSYN) IVPB 3.375 g    Comments: Give AFTER blood cx drawn     3.375 g 12.5 mL/hr over 240 Minutes Intravenous To Emergency Dept 11/16/11 1610 11/16/11 2158    11/16/11 1615   vancomycin (VANCOCIN) IVPB 1000 mg/200 mL premix     Comments: Give AFTER blood cx drawn      1,000 mg 200 mL/hr over 60 Minutes Intravenous To Emergency Dept 11/16/11 1610 11/16/11 1742          Assessment: 76 YOF on day #4/8 vancomycin and Cefepime for HCAP.  Patient completed 3 days of Levaquin yesterday per HCAP protocol.  CrCl stable, patient is afebrile, WBC wnl.  Goal of Therapy:  Vancomycin trough level 15-20 mcg/ml  Plan:  Continue vancomycin 500 mg IV q12h and Cefepime 1g IV q8h.  Obtain vancomycin trough in AM. F/u VT, culture results, and SCr.  Clance Boll 11/19/2011,1:40 PM

## 2011-11-19 NOTE — Progress Notes (Addendum)
Subjective: Patient states cough improving and SOB improving. No complaints. Increased sputum production with chest PT and turning per nursing.  Objective: Vital signs in last 24 hours: Filed Vitals:   11/19/11 0400 11/19/11 0500 11/19/11 0600 11/19/11 0813  BP: 107/48 106/52 108/53   Pulse: 82 85 85   Temp: 97.9 F (36.6 C) 98.1 F (36.7 C) 98.2 F (36.8 C) 97.3 F (36.3 C)  TempSrc:    Core (Comment)  Resp: 26 27 28    Height:   5\' 5"  (1.651 m)   Weight:   53.3 kg (117 lb 8.1 oz)   SpO2: 99% 91% 94%     Intake/Output Summary (Last 24 hours) at 11/19/11 1035 Last data filed at 11/19/11 0630  Gross per 24 hour  Intake   2125 ml  Output   1105 ml  Net   1020 ml    Weight change: 1.5 kg (3 lb 4.9 oz)  General: Alert, awake, oriented x3, in no acute distress.Quadraplegia HEENT: No bruits, no goiter. Heart: RRR, without murmurs, rubs, gallops. Lungs: Coarse diffuse BS improving. Shallow breaths Abdomen: Soft, nontender, nondistended, positive bowel sounds. Extremities: No clubbing cyanosis or edema with positive pedal pulses. Neuro: Grossly intact, nonfocal.     Lab Results:  Basename 11/19/11 0545 11/18/11 0335 11/17/11 0530  NA 140 138 --  K 3.9 4.6 --  CL 109 107 --  CO2 22 23 --  GLUCOSE 146* 160* --  BUN 17 19 --  CREATININE 0.26* 0.31* --  CALCIUM 9.7 9.7 --  MG -- 1.9 1.3*  PHOS -- -- --    Basename 11/17/11 0530  AST 8  ALT 5  ALKPHOS 72  BILITOT 0.2*  PROT 8.3  ALBUMIN 2.8*   No results found for this basename: LIPASE:2,AMYLASE:2 in the last 72 hours  Basename 11/19/11 0545 11/18/11 0335 11/16/11 1628  WBC 9.3 12.5* --  NEUTROABS -- 9.7* 16.0*  HGB 9.7* 10.5* --  HCT 31.2* 32.2* --  MCV 85.7 85.2 --  PLT 346 404* --    Basename 11/18/11 0055 11/17/11 1730 11/17/11 0942  CKTOTAL 47 20 21  CKMB 0.8 1.0 1.1  CKMBINDEX -- -- --  TROPONINI <0.30 <0.30 <0.30   No components found with this basename: POCBNP:3 No results found for this  basename: DDIMER:2 in the last 72 hours  Basename 11/17/11 0942  HGBA1C 6.7*   No results found for this basename: CHOL:2,HDL:2,LDLCALC:2,TRIG:2,CHOLHDL:2,LDLDIRECT:2 in the last 72 hours  Basename 11/17/11 0942 11/16/11 1628  TSH 0.029* --  T4TOTAL -- --  T3FREE -- 2.2*  THYROIDAB -- --   No results found for this basename: VITAMINB12:2,FOLATE:2,FERRITIN:2,TIBC:2,IRON:2,RETICCTPCT:2 in the last 72 hours  Micro Results: Recent Results (from the past 240 hour(s))  CULTURE, BLOOD (ROUTINE X 2)     Status: Normal (Preliminary result)   Collection Time   11/16/11  4:27 PM      Component Value Range Status Comment   Specimen Description BLOOD RIGHT ARM   Final    Special Requests BOTTLES DRAWN AEROBIC AND ANAEROBIC   Final    Culture  Setup Time [id number]   Final    Culture     Final    Value:        BLOOD CULTURE RECEIVED NO GROWTH TO DATE CULTURE WILL BE HELD FOR 5 DAYS BEFORE ISSUING A FINAL NEGATIVE REPORT   Report Status PENDING   Incomplete   CULTURE, BLOOD (ROUTINE X 2)     Status: Normal (Preliminary result)  Collection Time   11/16/11  4:35 PM      Component Value Range Status Comment   Specimen Description BLOOD LEFT WRIST   Final    Special Requests BOTTLES DRAWN AEROBIC AND ANAEROBIC   Final    Culture  Setup Time [id number]   Final    Culture     Final    Value:        BLOOD CULTURE RECEIVED NO GROWTH TO DATE CULTURE WILL BE HELD FOR 5 DAYS BEFORE ISSUING A FINAL NEGATIVE REPORT   Report Status PENDING   Incomplete   CULTURE, RESPIRATORY     Status: Normal (Preliminary result)   Collection Time   11/16/11 11:00 PM      Component Value Range Status Comment   Specimen Description SPUTUM   Final    Special Requests NONE   Final    Gram Stain     Final    Value: FEW WBC PRESENT, PREDOMINANTLY PMN     NO SQUAMOUS EPITHELIAL CELLS SEEN     NO ORGANISMS SEEN   Culture NORMAL OROPHARYNGEAL FLORA   Final    Report Status PENDING   Incomplete   CULTURE, EXPECTORATED  SPUTUM-ASSESSMENT     Status: Normal   Collection Time   11/16/11 11:02 PM      Component Value Range Status Comment   Specimen Description SPUTUM EXPECTORATED   Final    Special Requests NONE   Final    Sputum evaluation     Final    Value: THIS SPECIMEN IS ACCEPTABLE. RESPIRATORY CULTURE REPORT TO FOLLOW.   Report Status 11/16/2011 FINAL   Final   MRSA PCR SCREENING     Status: Normal   Collection Time   11/17/11 12:20 AM      Component Value Range Status Comment   MRSA by PCR NEGATIVE  NEGATIVE  Final     Studies/Results: Dg Swallowing Func-no Report  11/18/2011  CLINICAL DATA: ? aspiration   FLUOROSCOPY FOR SWALLOWING FUNCTION STUDY:  Fluoroscopy was provided for swallowing function study, which was  administered by a speech pathologist.  Final results and recommendations  from this study are contained within the speech pathology report.      Medications:     . albuterol  2.5 mg Nebulization Q6H  . antiseptic oral rinse  15 mL Mouth Rinse BID  . aspirin EC  81 mg Oral Daily  . ceFEPime (MAXIPIME) IV  1 g Intravenous Q8H  . chlorhexidine  15 mL Mouth Rinse BID  . enoxaparin (LOVENOX) injection  40 mg Subcutaneous QHS  . feeding supplement  1 Container Oral QAC supper  . gabapentin  100 mg Oral TID  . guaiFENesin  1,200 mg Oral BID  . insulin aspart  0-9 Units Subcutaneous Q4H  . levofloxacin (LEVAQUIN) IV  750 mg Intravenous Q24H  . metoprolol tartrate  25 mg Oral BID  . pantoprazole  40 mg Oral Q1200  . sodium chloride  3 mL Intravenous Q12H  . vancomycin  500 mg Intravenous Q12H    Assessment: Principal Problem:  *Sepsis Active Problems:  Acute respiratory failure  Healthcare-associated pneumonia  Quadriplegia  MS (multiple sclerosis)  Hypokalemia  Tachycardia  Low TSH level  Anemia   Plan: #1. Sepsis Likely secondary to HCAP. Patient is currently afebrile with WBC trending down. Patient's tachycardia improved with beta blocker and on 4L [place]. Urine strep pneum  antigen is negative. Blood cultures are pending. Sputum is pending. UA is negative.  Urine legionella antigen is negative. Lactic acid level was 2.1. Procalcitonin is <0.10. Continue empiric IV Vancomycin, cefepime and Levaquin. If blood cultures continue to remain negative will d/c vancomycin tomm.  #2 acute respiratory failure Likely secondary to healthcare associated pneumonia in the setting of advanced multiple sclerosis. Sputum Gram stain and cultures pending. Blood cultures are pending. Urine strep pneum antigen  is negative. Urine Legionella antigen is negative. We'll try to wean O2. Continue empiric IV vancomycin, cefepime and Levaquin. Continue nebs. Continue chest PT.  #3 healthcare associated pneumonia Sputum Gram stain and cultures pending. Urine strep pneumococcus antigen is negative. Urine Legionella antigen is negative. WBC is trending down. Continue empiric IV vancomycin, cefepime and Levaquin. Continue Mucinex. Continue nebs, chest PT. Patient  MBS with minimal dysphagia without aspiration or penetration of any consistency. Speech therapy ff.  #4 hypokalemia Likely secondary to a dose of diuretics given yesterday. Repleted.  #5 tachycardia Likely secondary to sepsis vs hyperthyroidism. Cardiac enzymes negative x3. Check a TSH is low at 0.029,  free T4 WNLand T3 decreased with free T3 minimally decreased Improved on increased dose lopressor.  #6 multiple sclerosis/quadriplegia Supportive care. MBS with minimal dysphagia without aspiration ortoday. Speech therapy ff.  #7 Well controlled diabetes Hemoglobin A1c = 6.7. Continue sliding scale insulin.  #8 hypertension Continue Lopressor.  #9 Low TSH Likely secondary to current infection/ euthyroid sick syndrome.Free T4 WNL and T3 decreased. Will need repeat TFTs in 4-6 weeks post discharge.  #10 gastroesophageal reflux disease PPI  #11 prophylaxis Protonix for GI prophylaxis, Lovenox for DVT prophylaxis.   LOS: 3 days    Francena Zender [phone] p 11/19/2011, 10:35 AM  #12 Anemia Likely dilutional component. No overt GIB. Check anemia panel. Follow H/H.

## 2011-11-20 DIAGNOSIS — T68XXXA Hypothermia, initial encounter: Secondary | ICD-10-CM | POA: Diagnosis not present

## 2011-11-20 DIAGNOSIS — D696 Thrombocytopenia, unspecified: Secondary | ICD-10-CM

## 2011-11-20 DIAGNOSIS — A413 Sepsis due to Hemophilus influenzae: Secondary | ICD-10-CM

## 2011-11-20 DIAGNOSIS — J9600 Acute respiratory failure, unspecified whether with hypoxia or hypercapnia: Secondary | ICD-10-CM

## 2011-11-20 LAB — CBC
Hemoglobin: 9.5 g/dL — ABNORMAL LOW (ref 12.0–15.0)
MCHC: 31.1 g/dL (ref 30.0–36.0)
Platelets: 344 10*3/uL (ref 150–400)
RBC: 3.59 MIL/uL — ABNORMAL LOW (ref 3.87–5.11)

## 2011-11-20 LAB — BASIC METABOLIC PANEL
BUN: 11 mg/dL (ref 6–23)
Calcium: 9.1 mg/dL (ref 8.4–10.5)
GFR calc Af Amer: >90 mL/min (ref >90)
GFR calc non Af Amer: >90 mL/min (ref >90)
Potassium: 3.5 mEq/L (ref 3.5–5.1)
Sodium: 143 mEq/L (ref 135–145)

## 2011-11-20 LAB — GLUCOSE, CAPILLARY
Glucose-Capillary: 212 mg/dL — ABNORMAL HIGH (ref 70–99)
Glucose-Capillary: 184 mg/dL — ABNORMAL HIGH (ref 70–99)
Glucose-Capillary: 224 mg/dL — ABNORMAL HIGH (ref 70–99)

## 2011-11-20 MED ORDER — VITAMINS A & D EX OINT
TOPICAL_OINTMENT | CUTANEOUS | Status: AC
  Administered 2011-11-20: 5
  Filled 2011-11-20: qty 5

## 2011-11-20 MED ORDER — POTASSIUM CHLORIDE CRYS ER 20 MEQ PO TBCR
40.0000 meq | EXTENDED_RELEASE_TABLET | Freq: Once | ORAL | Status: AC
  Administered 2011-11-20: 40 meq via ORAL
  Filled 2011-11-20: qty 2

## 2011-11-20 MED ORDER — DEXTROSE 5 % IV SOLN
1.0000 g | Freq: Two times a day (BID) | INTRAVENOUS | Status: DC
  Administered 2011-11-20 – 2011-11-23 (×7): 1 g via INTRAVENOUS
  Filled 2011-11-20 (×7): qty 1

## 2011-11-20 MED ORDER — VITAMINS A & D EX OINT
TOPICAL_OINTMENT | CUTANEOUS | Status: AC
  Filled 2011-11-20: qty 5

## 2011-11-20 NOTE — Progress Notes (Signed)
Subjective: Patient states cough improving and SOB improving. No complaints. Increased sputum production with chest PT with turning per nursing. Per nursing patient with thinner secretions. Family at bedside and state patient improving.  Objective: Vital signs in last 24 hours: Filed Vitals:   11/20/11 0800 11/20/11 0846 11/20/11 0900 11/20/11 1000  BP: 116/58  125/60 105/45  Pulse: 88  90 82  Temp: 98.6 F (37 C)  96.6 F (35.9 C) 97.5 F (36.4 C)  TempSrc: Core (Comment)     Resp: 22  15 23   Height:      Weight:      SpO2: 95% 96% 100% 98%    Intake/Output Summary (Last 24 hours) at 11/20/11 1011 Last data filed at 11/20/11 0900  Gross per 24 hour  Intake 1957.5 ml  Output   1150 ml  Net  807.5 ml    Weight change:   General: Alert, awake, oriented x3, in no acute distress.Quadraplegia HEENT: No bruits, no goiter. Heart: RRR, without murmurs, rubs, gallops. Lungs: Coarse diffuse BS improving. Shallow breaths Abdomen: Soft, nontender, nondistended, positive bowel sounds. Extremities: No clubbing cyanosis or edema with positive pedal pulses. Neuro: Grossly intact, nonfocal.     Lab Results:  Basename 11/20/11 0515 11/19/11 0545 11/18/11 0335  NA 143 140 --  K 3.5 3.9 --  CL 112 109 --  CO2 25 22 --  GLUCOSE 130* 146* --  BUN 11 17 --  CREATININE 0.27* 0.26* --  CALCIUM 9.1 9.7 --  MG -- -- 1.9  PHOS -- -- --   No results found for this basename: AST:2,ALT:2,ALKPHOS:2,BILITOT:2,PROT:2,ALBUMIN:2 in the last 72 hours No results found for this basename: LIPASE:2,AMYLASE:2 in the last 72 hours  Basename 11/20/11 0515 11/19/11 0545 11/18/11 0335  WBC 8.7 9.3 --  NEUTROABS -- -- 9.7*  HGB 9.5* 9.7* --  HCT 30.5* 31.2* --  MCV 85.0 85.7 --  PLT 344 346 --    Basename 11/18/11 0055 11/17/11 1730  CKTOTAL 47 20  CKMB 0.8 1.0  CKMBINDEX -- --  TROPONINI <0.30 <0.30   No components found with this basename: POCBNP:3 No results found for this basename:  DDIMER:2 in the last 72 hours No results found for this basename: HGBA1C:2 in the last 72 hours No results found for this basename: CHOL:2,HDL:2,LDLCALC:2,TRIG:2,CHOLHDL:2,LDLDIRECT:2 in the last 72 hours No results found for this basename: TSH,T4TOTAL,FREET3,T3FREE,THYROIDAB in the last 72 hours  Basename 11/19/11 1030  VITAMINB12 529  FOLATE 4.8  FERRITIN 124  TIBC 164*  IRON 19*  RETICCTPCT --    Micro Results: Recent Results (from the past 240 hour(s))  CULTURE, BLOOD (ROUTINE X 2)     Status: Normal (Preliminary result)   Collection Time   11/16/11  4:27 PM      Component Value Range Status Comment   Specimen Description BLOOD RIGHT ARM   Final    Special Requests BOTTLES DRAWN AEROBIC AND ANAEROBIC   Final    Culture  Setup Time [id number]   Final    Culture     Final    Value:        BLOOD CULTURE RECEIVED NO GROWTH TO DATE CULTURE WILL BE HELD FOR 5 DAYS BEFORE ISSUING A FINAL NEGATIVE REPORT   Report Status PENDING   Incomplete   CULTURE, BLOOD (ROUTINE X 2)     Status: Normal (Preliminary result)   Collection Time   11/16/11  4:35 PM      Component Value Range Status Comment  Specimen Description BLOOD LEFT WRIST   Final    Special Requests BOTTLES DRAWN AEROBIC AND ANAEROBIC   Final    Culture  Setup Time [id number]   Final    Culture     Final    Value:        BLOOD CULTURE RECEIVED NO GROWTH TO DATE CULTURE WILL BE HELD FOR 5 DAYS BEFORE ISSUING A FINAL NEGATIVE REPORT   Report Status PENDING   Incomplete   CULTURE, RESPIRATORY     Status: Normal   Collection Time   11/16/11 11:00 PM      Component Value Range Status Comment   Specimen Description SPUTUM   Final    Special Requests NONE   Final    Gram Stain     Final    Value: FEW WBC PRESENT, PREDOMINANTLY PMN     NO SQUAMOUS EPITHELIAL CELLS SEEN     NO ORGANISMS SEEN   Culture NORMAL OROPHARYNGEAL FLORA   Final    Report Status 11/19/2011 FINAL   Final   CULTURE, EXPECTORATED SPUTUM-ASSESSMENT      Status: Normal   Collection Time   11/16/11 11:02 PM      Component Value Range Status Comment   Specimen Description SPUTUM EXPECTORATED   Final    Special Requests NONE   Final    Sputum evaluation     Final    Value: THIS SPECIMEN IS ACCEPTABLE. RESPIRATORY CULTURE REPORT TO FOLLOW.   Report Status 11/16/2011 FINAL   Final   MRSA PCR SCREENING     Status: Normal   Collection Time   11/17/11 12:20 AM      Component Value Range Status Comment   MRSA by PCR NEGATIVE  NEGATIVE  Final     Studies/Results: Dg Swallowing Func-no Report  11/18/2011  CLINICAL DATA: ? aspiration   FLUOROSCOPY FOR SWALLOWING FUNCTION STUDY:  Fluoroscopy was provided for swallowing function study, which was  administered by a speech pathologist.  Final results and recommendations  from this study are contained within the speech pathology report.      Medications:     . albuterol  2.5 mg Nebulization Q6H  . antiseptic oral rinse  15 mL Mouth Rinse BID  . aspirin EC  81 mg Oral Daily  . ceFEPime (MAXIPIME) IV  1 g Intravenous Q8H  . chlorhexidine  15 mL Mouth Rinse BID  . enoxaparin (LOVENOX) injection  40 mg Subcutaneous QHS  . feeding supplement  1 Container Oral QAC supper  . gabapentin  100 mg Oral TID  . guaiFENesin  1,200 mg Oral BID  . insulin aspart  0-9 Units Subcutaneous Q4H  . metoprolol tartrate  25 mg Oral BID  . pantoprazole  40 mg Oral Q1200  . potassium chloride  40 mEq Oral Once  . sodium chloride  3 mL Intravenous Q12H  . vancomycin  500 mg Intravenous Q12H    Assessment: Principal Problem:  *Sepsis Active Problems:  Acute respiratory failure  Healthcare-associated pneumonia  Quadriplegia  MS (multiple sclerosis)  Hypokalemia  Tachycardia  Low TSH level  Anemia  Hypothermia   Plan: #1. Sepsis Likely secondary to HCAP. Patient is currently afebrile and now hypothermic with WBC trending down. Patient's tachycardia improved with beta blocker and on 4L [place]. Urine strep pneum  antigen is negative. Blood cultures are pending. Sputum is pending. UA is negative. Urine legionella antigen is negative. Lactic acid level was 2.1. Procalcitonin is <0.10. Continue empiric IV Vancomycin,  cefepime and Levaquin.   #2. Hypothermia Questionable etiology. Likely secondary to #1. Will reculture blood. Repeat lactic acid level and procalcitonin. Patient is not hypoglycemic. Bear hugger. Follow  #3 acute respiratory failure Likely secondary to healthcare associated pneumonia in the setting of advanced multiple sclerosis. Sputum Gram stain and cultures pending. Blood cultures are pending. Urine strep pneum antigen  is negative. Urine Legionella antigen is negative. We'll try to wean O2. Continue empiric IV vancomycin, cefepime and Levaquin. Continue nebs. Continue chest PT.  #4 healthcare associated pneumonia  Clinical improvement. Sputum Gram stain and cultures pending. Urine strep pneumococcus antigen is negative. Urine Legionella antigen is negative. WBC is trending down. Continue empiric IV vancomycin, cefepime and Levaquin. Continue Mucinex. Continue nebs, chest PT. Patient  MBS with minimal dysphagia without aspiration or penetration of any consistency. Speech therapy ff.  #5 hypokalemia Likely secondary to a dose of diuretics given yesterday. Repleted.  #6 tachycardia Likely secondary to sepsis vs hyperthyroidism. Cardiac enzymes negative x3.  TSH is low at 0.029,  free T4 WNLand T3 decreased with free T3 minimally decreased Improved on increased dose lopressor.  #7 multiple sclerosis/quadriplegia Supportive care. MBS with minimal dysphagia without aspiration ortoday. Speech therapy ff.  #8 Well controlled diabetes Hemoglobin A1c = 6.7. Continue sliding scale insulin.  #9 hypertension Continue Lopressor.  #10 Low TSH Likely secondary to current infection/ euthyroid sick syndrome.Free T4 WNL and T3 decreased. Will need repeat TFTs in 4-6 weeks post discharge.  #11  gastroesophageal reflux disease PPI  #12  Iron deficiency Anemia Likely dilutional component. No overt GIB. Anemia panel c/w Iron def anemia. May need iron supplementation. Follow H/H. #11 prophylaxis Protonix for GI prophylaxis, Lovenox for DVT prophylaxis.   LOS: 4 days   Morningstar Toft [phone] p 11/20/2011, 10:11 AM

## 2011-11-20 NOTE — Progress Notes (Addendum)
ANTIBIOTIC CONSULT NOTE - FOLLOW UP  Pharmacy Consult for Vancomycin Indication: pneumonia  No Known Allergies  Patient Measurements: Height: 5\' 5"  (165.1 cm) Weight: 117 lb 8.1 oz (53.3 kg) IBW/kg (Calculated) : 57   Vital Signs: Temp: 98.4 F (36.9 C) (05/12 0400) Temp src: Oral (05/11 2000) BP: 109/43 mmHg (05/12 0300) Pulse Rate: 82  (05/12 0400) Intake/Output from previous day: 05/11 0701 - 05/12 0700 In: 2017.5 [P.O.:240; I.V.:1677.5; IV Piggyback:100] Out: 1100 [Urine:1100] Intake/Output from this shift: Total I/O In: 877.5 [I.V.:777.5; IV Piggyback:100] Out: 700 [Urine:700]  Labs:  [facility] 11/20/11 0515 11/19/11 0545 11/18/11 0335  WBC 8.7 9.3 12.5*  HGB 9.5* 9.7* 10.5*  PLT 344 346 404*  LABCREA -- -- --  CREATININE 0.27* 0.26* 0.31*   Estimated Creatinine Clearance: 51.9 ml/min (by C-G formula based on Cr of 0.27).  Basename 11/20/11 0515  VANCOTROUGH 12.2  VANCOPEAK --  VANCORANDOM --  GENTTROUGH --  GENTPEAK --  GENTRANDOM --  TOBRATROUGH --  TOBRAPEAK --  TOBRARND --  AMIKACINPEAK --  AMIKACINTROU --  AMIKACIN --     Microbiology: Recent Results (from the past 720 hour(s))  CULTURE, BLOOD (ROUTINE X 2)     Status: Normal (Preliminary result)   Collection Time   11/16/11  4:27 PM      Component Value Range Status Comment   Specimen Description BLOOD RIGHT ARM   Final    Special Requests BOTTLES DRAWN AEROBIC AND ANAEROBIC   Final    Culture  Setup Time [id number]   Final    Culture     Final    Value:        BLOOD CULTURE RECEIVED NO GROWTH TO DATE CULTURE WILL BE HELD FOR 5 DAYS BEFORE ISSUING A FINAL NEGATIVE REPORT   Report Status PENDING   Incomplete   CULTURE, BLOOD (ROUTINE X 2)     Status: Normal (Preliminary result)   Collection Time   11/16/11  4:35 PM      Component Value Range Status Comment   Specimen Description BLOOD LEFT WRIST   Final    Special Requests BOTTLES DRAWN AEROBIC AND ANAEROBIC   Final    Culture  Setup  Time [id number]   Final    Culture     Final    Value:        BLOOD CULTURE RECEIVED NO GROWTH TO DATE CULTURE WILL BE HELD FOR 5 DAYS BEFORE ISSUING A FINAL NEGATIVE REPORT   Report Status PENDING   Incomplete   CULTURE, RESPIRATORY     Status: Normal   Collection Time   11/16/11 11:00 PM      Component Value Range Status Comment   Specimen Description SPUTUM   Final    Special Requests NONE   Final    Gram Stain     Final    Value: FEW WBC PRESENT, PREDOMINANTLY PMN     NO SQUAMOUS EPITHELIAL CELLS SEEN     NO ORGANISMS SEEN   Culture NORMAL OROPHARYNGEAL FLORA   Final    Report Status 11/19/2011 FINAL   Final   CULTURE, EXPECTORATED SPUTUM-ASSESSMENT     Status: Normal   Collection Time   11/16/11 11:02 PM      Component Value Range Status Comment   Specimen Description SPUTUM EXPECTORATED   Final    Special Requests NONE   Final    Sputum evaluation     Final    Value: THIS SPECIMEN IS ACCEPTABLE. RESPIRATORY CULTURE  REPORT TO FOLLOW.   Report Status 11/16/2011 FINAL   Final   MRSA PCR SCREENING     Status: Normal   Collection Time   11/17/11 12:20 AM      Component Value Range Status Comment   MRSA by PCR NEGATIVE  NEGATIVE  Final     Anti-infectives     Start     Dose/Rate Route Frequency Ordered Stop   11/17/11 0600   vancomycin (VANCOCIN) 500 mg in sodium chloride 0.9 % 100 mL IVPB        500 mg 100 mL/hr over 60 Minutes Intravenous Every 12 hours 11/17/11 0032     11/16/11 2359   levofloxacin (LEVAQUIN) IVPB 750 mg        750 mg 100 mL/hr over 90 Minutes Intravenous Every 24 hours 11/16/11 2203 11/19/11 0123   11/16/11 2300   ceFEPIme (MAXIPIME) 1 g in dextrose 5 % 50 mL IVPB        1 g 100 mL/hr over 30 Minutes Intravenous 3 times per day 11/16/11 2203 11/24/11 2159   11/16/11 1615   piperacillin-tazobactam (ZOSYN) IVPB 3.375 g     Comments: Give AFTER blood cx drawn      3.375 g 12.5 mL/hr over 240 Minutes Intravenous To Emergency Dept 11/16/11 1610 11/16/11  2158   11/16/11 1615   vancomycin (VANCOCIN) IVPB 1000 mg/200 mL premix     Comments: Give AFTER blood cx drawn      1,000 mg 200 mL/hr over 60 Minutes Intravenous To Emergency Dept 11/16/11 1610 11/16/11 1742          Assessment: 76 YOF on day #5/8 vancomycin and Cefepime for HCAP.  Patient completed 3 days of Levaquin yesterday per HCAP protocol.  CrCl stable, patient is afebrile, WBC wnl. VT = 12.2 mg/L. Slightly below goal, but will leave @ 500mg  IV q12h for now.  Goal of Therapy:  Vancomycin trough level 15-20 mcg/ml  Plan:  Continue vancomycin 500 mg IV q12h and Cefepime 1g IV q8h.  F/u VT, culture results, and SCr.  Susanne Greenhouse R 11/20/2011,6:26 AM   Addendum:  CrCl 51 ml/min, will change Cefepime to 1gm IV q12.  Geoffry Paradise, PharmD.   Pager:  [phone] 12:25 PM

## 2011-11-21 DIAGNOSIS — A413 Sepsis due to Hemophilus influenzae: Secondary | ICD-10-CM

## 2011-11-21 DIAGNOSIS — T68XXXA Hypothermia, initial encounter: Secondary | ICD-10-CM

## 2011-11-21 DIAGNOSIS — D696 Thrombocytopenia, unspecified: Secondary | ICD-10-CM

## 2011-11-21 DIAGNOSIS — J9600 Acute respiratory failure, unspecified whether with hypoxia or hypercapnia: Secondary | ICD-10-CM

## 2011-11-21 LAB — GLUCOSE, CAPILLARY: Glucose-Capillary: 116 mg/dL — ABNORMAL HIGH (ref 70–99)

## 2011-11-21 LAB — CBC
MCH: 26.8 pg (ref 26.0–34.0)
Platelets: 329 10*3/uL (ref 150–400)
RBC: 3.51 MIL/uL — ABNORMAL LOW (ref 3.87–5.11)
WBC: 9.5 10*3/uL (ref 4.0–10.5)

## 2011-11-21 LAB — BASIC METABOLIC PANEL
CO2: 24 mEq/L (ref 19–32)
Calcium: 8.8 mg/dL (ref 8.4–10.5)
Chloride: 111 mEq/L (ref 96–112)
GFR calc Af Amer: >90 mL/min (ref >90)
Sodium: 143 mEq/L (ref 135–145)

## 2011-11-21 MED ORDER — POTASSIUM CHLORIDE CRYS ER 20 MEQ PO TBCR
40.0000 meq | EXTENDED_RELEASE_TABLET | Freq: Once | ORAL | Status: AC
  Administered 2011-11-21: 40 meq via ORAL
  Filled 2011-11-21: qty 2

## 2011-11-21 NOTE — Clinical Social Work Note (Signed)
CSW continues to follow for SNF. Pt to return to [facility] where she was private pay. Pt needs PT/OT when able to assist with this plan. CSW to follow.  Vennie Homans, [place] 11/21/2011 12:39 PM #[phone]

## 2011-11-21 NOTE — Progress Notes (Signed)
Subjective: Patient states cough improving and SOB improving. No complaints. Increased sputum production with chest PT with turning per nursing. Per nursing patient with thinner secretions. Patient states feels better. Patient with sats appro => 95% on RA  Objective: Vital signs in last 24 hours: Filed Vitals:   11/21/11 0000 11/21/11 0336 11/21/11 0800 11/21/11 0829  BP:  103/47 127/71   Pulse:  72 71   Temp: 99 F (37.2 C) 98.1 F (36.7 C) 98.1 F (36.7 C)   TempSrc: Core (Comment)     Resp:  25 26   Height:      Weight:  54.9 kg (121 lb 0.5 oz)    SpO2:  97% 98% 98%    Intake/Output Summary (Last 24 hours) at 11/21/11 1032 Last data filed at 11/21/11 1000  Gross per 24 hour  Intake   2210 ml  Output   2100 ml  Net    110 ml    Weight change:   General: Alert, awake, oriented x3, in no acute distress.Quadraplegia HEENT: No bruits, no goiter. Heart: RRR, without murmurs, rubs, gallops. Lungs: Coarse diffuse BS improving. Shallow breaths Abdomen: Soft, nontender, nondistended, positive bowel sounds. Extremities: No clubbing cyanosis or edema with positive pedal pulses. Neuro: Grossly intact, nonfocal.     Lab Results:  Basename 11/21/11 0330 11/20/11 0515  NA 143 143  K 3.3* 3.5  CL 111 112  CO2 24 25  GLUCOSE 117* 130*  BUN 7 11  CREATININE 0.23* 0.27*  CALCIUM 8.8 9.1  MG 1.4* --  PHOS -- --   No results found for this basename: AST:2,ALT:2,ALKPHOS:2,BILITOT:2,PROT:2,ALBUMIN:2 in the last 72 hours No results found for this basename: LIPASE:2,AMYLASE:2 in the last 72 hours  Basename 11/21/11 0330 11/20/11 0515  WBC 9.5 8.7  NEUTROABS -- --  HGB 9.4* 9.5*  HCT 29.6* 30.5*  MCV 84.3 85.0  PLT 329 344   No results found for this basename: CKTOTAL:3,CKMB:3,CKMBINDEX:3,TROPONINI:3 in the last 72 hours No components found with this basename: POCBNP:3 No results found for this basename: DDIMER:2 in the last 72 hours No results found for this basename:  HGBA1C:2 in the last 72 hours No results found for this basename: CHOL:2,HDL:2,LDLCALC:2,TRIG:2,CHOLHDL:2,LDLDIRECT:2 in the last 72 hours No results found for this basename: TSH,T4TOTAL,FREET3,T3FREE,THYROIDAB in the last 72 hours  Basename 11/19/11 1030  VITAMINB12 529  FOLATE 4.8  FERRITIN 124  TIBC 164*  IRON 19*  RETICCTPCT --    Micro Results: Recent Results (from the past 240 hour(s))  CULTURE, BLOOD (ROUTINE X 2)     Status: Normal (Preliminary result)   Collection Time   11/16/11  4:27 PM      Component Value Range Status Comment   Specimen Description BLOOD RIGHT ARM   Final    Special Requests BOTTLES DRAWN AEROBIC AND ANAEROBIC   Final    Culture  Setup Time [id number]   Final    Culture     Final    Value:        BLOOD CULTURE RECEIVED NO GROWTH TO DATE CULTURE WILL BE HELD FOR 5 DAYS BEFORE ISSUING A FINAL NEGATIVE REPORT   Report Status PENDING   Incomplete   CULTURE, BLOOD (ROUTINE X 2)     Status: Normal (Preliminary result)   Collection Time   11/16/11  4:35 PM      Component Value Range Status Comment   Specimen Description BLOOD LEFT WRIST   Final    Special Requests BOTTLES DRAWN AEROBIC AND ANAEROBIC    Final    Culture  Setup Time [id number]   Final    Culture     Final    Value:        BLOOD CULTURE RECEIVED NO GROWTH TO DATE CULTURE WILL BE HELD FOR 5 DAYS BEFORE ISSUING A FINAL NEGATIVE REPORT   Report Status PENDING   Incomplete   CULTURE, RESPIRATORY     Status: Normal   Collection Time   11/16/11 11:00 PM      Component Value Range Status Comment   Specimen Description SPUTUM   Final    Special Requests NONE   Final    Gram Stain     Final    Value: FEW WBC PRESENT, PREDOMINANTLY PMN     NO SQUAMOUS EPITHELIAL CELLS SEEN     NO ORGANISMS SEEN   Culture NORMAL OROPHARYNGEAL FLORA   Final    Report Status 11/19/2011 FINAL   Final   CULTURE, EXPECTORATED SPUTUM-ASSESSMENT     Status: Normal   Collection Time   11/16/11 11:02 PM      Component  Value Range Status Comment   Specimen Description SPUTUM EXPECTORATED   Final    Special Requests NONE   Final    Sputum evaluation     Final    Value: THIS SPECIMEN IS ACCEPTABLE. RESPIRATORY CULTURE REPORT TO FOLLOW.   Report Status 11/16/2011 FINAL   Final   MRSA PCR SCREENING     Status: Normal   Collection Time   11/17/11 12:20 AM      Component Value Range Status Comment   MRSA by PCR NEGATIVE  NEGATIVE  Final     Studies/Results: No results found.  Medications:     . albuterol  2.5 mg Nebulization Q6H  . antiseptic oral rinse  15 mL Mouth Rinse BID  . aspirin EC  81 mg Oral Daily  . ceFEPime (MAXIPIME) IV  1 g Intravenous Q12H  . chlorhexidine  15 mL Mouth Rinse BID  . enoxaparin (LOVENOX) injection  40 mg Subcutaneous QHS  . feeding supplement  1 Container Oral QAC supper  . gabapentin  100 mg Oral TID  . guaiFENesin  1,200 mg Oral BID  . insulin aspart  0-9 Units Subcutaneous Q4H  . metoprolol tartrate  25 mg Oral BID  . pantoprazole  40 mg Oral Q1200  . potassium chloride  40 mEq Oral Once  . sodium chloride  3 mL Intravenous Q12H  . vancomycin  500 mg Intravenous Q12H  . vitamin A & D      . DISCONTD: ceFEPime (MAXIPIME) IV  1 g Intravenous Q8H    Assessment: Principal Problem:  *Sepsis Active Problems:  Acute respiratory failure  Healthcare-associated pneumonia  Quadriplegia  MS (multiple sclerosis)  Hypokalemia  Tachycardia  Low TSH level  Anemia  Hypothermia   Plan: #1. Sepsis Likely secondary to HCAP. Patient is currently afebrile and  Hypothermia resolved after using bear hugger with WBC trending down. Patient's tachycardia improved with beta blocker and on 4L [place]. Urine strep pneum antigen is negative. Blood cultures are pending. Sputum is pending. UA is negative. Urine legionella antigen is negative. Lactic acid level was 2.1. Procalcitonin is <0.10. Continue empiric IV Vancomycin, cefepime and Levaquin. Follow  #2. Hypothermia Questionable  etiology. Likely secondary to #1. Resolved. Repeat blood cultures pending. Repeat lactic acid level and procalcitonin. Patient is not hypoglycemic. Bear hugger. Follow  #3 acute respiratory failure Likely secondary to healthcare associated pneumonia in  the setting of advanced multiple sclerosis. Sputum Gram stain and cultures pending. Blood cultures are pending. Urine strep pneum antigen  is negative. Urine Legionella antigen is negative. We'll try to wean O2. Clinincally improved. Continue empiric IV vancomycin, cefepime and Levaquin. Continue nebs. Continue chest PT.  #4 healthcare associated pneumonia  Clinical improvement. Sputum Gram stain and cultures pending. Urine strep pneumococcus antigen is negative. Urine Legionella antigen is negative. WBC is trending down. Continue empiric IV vancomycin, cefepime and Levaquin. Continue Mucinex. Continue nebs, chest PT. Patient  MBS with minimal dysphagia without aspiration or penetration of any consistency. Speech therapy ff.  #5 hypokalemia Likely secondary to a dose of diuretics given yesterday. Repleted.  #6 tachycardia Likely secondary to sepsis vs hyperthyroidism. Cardiac enzymes negative x3.  TSH is low at 0.029,  free T4 WNLand T3 decreased with free T3 minimally decreased Improved on increased dose lopressor.  #7 multiple sclerosis/quadriplegia Supportive care. MBS with minimal dysphagia without aspiration ortoday. Speech therapy ff.  #8 Well controlled diabetes Hemoglobin A1c = 6.7. Continue sliding scale insulin.  #9 hypertension Continue Lopressor.  #10 Low TSH Likely secondary to current infection/ euthyroid sick syndrome.Free T4 WNL and T3 decreased. Will need repeat TFTs in 4-6 weeks post discharge.  #11 gastroesophageal reflux disease PPI  #12  Iron deficiency Anemia Likely dilutional component. No overt GIB. Anemia panel c/w Iron def anemia. May need iron supplementation. Follow H/H. #13 prophylaxis Protonix for GI  prophylaxis, Lovenox for DVT prophylaxis. #14 Dispo Transfer to telemetry   LOS: 5 days   [name] [phone] p 11/21/2011, 10:32 AM

## 2011-11-21 NOTE — Progress Notes (Signed)
CARE MANAGEMENT NOTE 11/21/2011  Patient:  [name], [name]   Account Number:  [id number]  Date Initiated:  11/17/2011  Documentation initiated by:  Guinevere Stephenson  Subjective/Objective Assessment:   patient with history of ms and quad for over 1 year from local snf, presented with sob and wob, cxr-lll pna 02 on admission to ed 89%.     Action/Plan:   snf   Anticipated DC Date:  11/24/2011   Anticipated DC Plan:  SKILLED NURSING FACILITY  In-house referral  Clinical Social Worker      DC Planning Services  NA      [facility] Choice  NA   Choice offered to / List presented to:  NA   DME arranged  NA      DME agency  NA     HH arranged  NA      HH agency  NA   Status of service:  In process, will continue to follow Medicare Important Message given?  NA - LOS <3 / Initial given by admissions (If response is "NO", the following Medicare IM given date fields will be blank) Date Medicare IM given:   Date Additional Medicare IM given:    Discharge Disposition:    Per UR Regulation:  Reviewed for med. necessity/level of care/duration of stay  If discussed at Long Length of Stay Meetings, dates discussed:    Comments:  [id number] Lamping Earlene Plater, RN, BSN, CCM beta blocker being readjusted for tachycardia, being transferred to telemtery acute bed [id number]/ still period of hypothermia/ hcap/ lives at [facility] No discharge needs present at time of this review at the sdu/icu level. Case Management [phone]   [id number] Earlene Plater, RN, BSN, CCM No discharge needs present at time of this review at the sdu/icu level. Case Management [phone]

## 2011-11-21 NOTE — Plan of Care (Signed)
Problem: Phase I Progression Outcomes Goal: Code status addressed with pt/family Outcome: Completed/Met Date Met:  11/21/11 Full code

## 2011-11-22 DIAGNOSIS — J9600 Acute respiratory failure, unspecified whether with hypoxia or hypercapnia: Secondary | ICD-10-CM

## 2011-11-22 DIAGNOSIS — A413 Sepsis due to Hemophilus influenzae: Secondary | ICD-10-CM

## 2011-11-22 DIAGNOSIS — D696 Thrombocytopenia, unspecified: Secondary | ICD-10-CM

## 2011-11-22 DIAGNOSIS — T68XXXA Hypothermia, initial encounter: Secondary | ICD-10-CM

## 2011-11-22 LAB — GLUCOSE, CAPILLARY
Glucose-Capillary: 133 mg/dL — ABNORMAL HIGH (ref 70–99)
Glucose-Capillary: 134 mg/dL — ABNORMAL HIGH (ref 70–99)
Glucose-Capillary: 136 mg/dL — ABNORMAL HIGH (ref 70–99)
Glucose-Capillary: 145 mg/dL — ABNORMAL HIGH (ref 70–99)
Glucose-Capillary: 165 mg/dL — ABNORMAL HIGH (ref 70–99)
Glucose-Capillary: 166 mg/dL — ABNORMAL HIGH (ref 70–99)
Glucose-Capillary: 209 mg/dL — ABNORMAL HIGH (ref 70–99)

## 2011-11-22 LAB — BASIC METABOLIC PANEL
BUN: 5 mg/dL — ABNORMAL LOW (ref 6–23)
BUN: 4 mg/dL — ABNORMAL LOW (ref 6–23)
CO2: 24 mEq/L (ref 19–32)
Calcium: 8.9 mg/dL (ref 8.4–10.5)
Chloride: 108 mEq/L (ref 96–112)
Creatinine, Ser: <0.2 mg/dL — ABNORMAL LOW (ref 0.50–1.10)
GFR calc non Af Amer: >90 mL/min (ref >90)
Glucose, Bld: 158 mg/dL — ABNORMAL HIGH (ref 70–99)
Potassium: 3 mEq/L — ABNORMAL LOW (ref 3.5–5.1)

## 2011-11-22 LAB — CBC
HCT: 29.7 % — ABNORMAL LOW (ref 36.0–46.0)
Hemoglobin: 9.6 g/dL — ABNORMAL LOW (ref 12.0–15.0)
MCH: 26.7 pg (ref 26.0–34.0)
MCHC: 32.3 g/dL (ref 30.0–36.0)
MCV: 82.7 fL (ref 78.0–100.0)
RBC: 3.59 MIL/uL — ABNORMAL LOW (ref 3.87–5.11)

## 2011-11-22 LAB — MAGNESIUM: Magnesium: 1.4 mg/dL — ABNORMAL LOW (ref 1.5–2.5)

## 2011-11-22 MED ORDER — POTASSIUM CHLORIDE CRYS ER 20 MEQ PO TBCR
40.0000 meq | EXTENDED_RELEASE_TABLET | ORAL | Status: AC
  Administered 2011-11-22 (×2): 40 meq via ORAL
  Filled 2011-11-22 (×2): qty 2

## 2011-11-22 MED ORDER — POTASSIUM CHLORIDE CRYS ER 20 MEQ PO TBCR
40.0000 meq | EXTENDED_RELEASE_TABLET | Freq: Once | ORAL | Status: AC
  Administered 2011-11-22: 40 meq via ORAL
  Filled 2011-11-22: qty 2

## 2011-11-22 MED ORDER — SODIUM CHLORIDE 0.9 % IV SOLN
250.0000 mg | INTRAVENOUS | Status: DC
  Administered 2011-11-22: 250 mg via INTRAVENOUS
  Filled 2011-11-22 (×2): qty 20

## 2011-11-22 MED ORDER — MAGNESIUM SULFATE 50 % IJ SOLN
3.0000 g | Freq: Once | INTRAVENOUS | Status: AC
  Administered 2011-11-22: 3 g via INTRAVENOUS
  Filled 2011-11-22: qty 6

## 2011-11-22 NOTE — Progress Notes (Signed)
Interim history Ms. kuntzman is a 77 year old white female from skilled nursing facility with past history significant for advanced multiple sclerosis with quadriplegia, bedbound at baseline presents today with the above-mentioned complaint.  Patient and daughter's report intermittent cough and congestion for several weeks.  6 days ago she had increased cough congestion or shortness of breath and was diagnosed with pneumonia and started on moxifloxacin at the facility, despite this she reported increasing congestion shortness of breath and stuffiness, and presented to the ER today.  She denies any fevers, reports chills.  Denies any abdominal pain nausea or vomiting, diarrhea or urinary symptoms.  Upon evaluation the emergency room noted to have a white count of 18,000 with a left shift and chest x-ray concerning for basilar pneumonia. Patient was admitted to the step down unit where she was closely monitored. Patient was placed empirically on IV vancomycin, cefepime and she was followed. Due to patient's multiple sclerosis and an appointment please are patient was unable to mobilize his secretions. Patient was subsequently placed on chest physiotherapy with frequent turning which mobilize his secretions she was actively suctioned on a daily basis and that improved her oxygen saturations as well. Patient was pan cultured which came back negative. Blood cultures were also drawn which came back negative. Patient slowly improved on a daily basis. Patient subsequently did get hypothermic 2 blood cultures will we drawn this was placed on the perihilar. Patient's hypothermia resolved. Patient was not hypoglycemic at the time of hyperthermia. Patient was also noted to be tachycardic and beta blocker dose was increased which improved her tachycardia. A TSH was also obtained during the workup of her tachycardia which came back decreased. However free T4 and T3 which were obtained were within normal limits and was felt her  low TSH was likely secondary to euthyroid sick syndrome. Patient will need followup TFTs done as outpatient. Patient was also noted to be hypokalemic during the hospitalization and her potassium is being actively repleted. Patient improved daily and his secretions thinned out and she was subsequently transferred to the telemetry floor. Patient is currently stable on the telemetry floor and vancomycin will be discontinued. Patient will be maintained on cefepime for now.    Subjective: Patient states cough improving and SOB improving. Patient states feels better. Patient with sats appro => 95% on RA. Patient wants to go home.  Objective: Vital signs in last 24 hours: Filed Vitals:   11/21/11 2003 11/21/11 2243 11/22/11 0415 11/22/11 0757  BP:  150/65 138/75   Pulse:  96 107   Temp:  98.4 F (36.9 C) 98.9 F (37.2 C)   TempSrc:  Oral Oral   Resp:  20 20   Height:      Weight:      SpO2: 91% 91% 91% 90%    Intake/Output Summary (Last 24 hours) at 11/22/11 1337 Last data filed at 11/22/11 1232  Gross per 24 hour  Intake   1385 ml  Output    200 ml  Net   1185 ml    Weight change: -0.2 kg (-7.1 oz)  General: Alert, awake, oriented x3, in no acute distress.Quadraplegia HEENT: No bruits, no goiter. Heart: RRR, without murmurs, rubs, gallops. Lungs: Coarse diffuse BS improving. Shallow breaths Abdomen: Soft, nontender, nondistended, positive bowel sounds. Extremities: No clubbing cyanosis or edema with positive pedal pulses. Neuro: Grossly intact, nonfocal.     Lab Results:  Basename 11/22/11 0450 11/21/11 0330  NA 140 143  K 3.0* 3.3*  CL 108  111  CO2 24 24  GLUCOSE 158* 117*  BUN 5* 7  CREATININE 0.21* 0.23*  CALCIUM 9.1 8.8  MG 1.4* 1.4*  PHOS -- --   No results found for this basename: AST:2,ALT:2,ALKPHOS:2,BILITOT:2,PROT:2,ALBUMIN:2 in the last 72 hours No results found for this basename: LIPASE:2,AMYLASE:2 in the last 72 hours  Basename 11/22/11 0450 11/21/11  0330  WBC 9.5 9.5  NEUTROABS -- --  HGB 9.6* 9.4*  HCT 29.7* 29.6*  MCV 82.7 84.3  PLT 395 329   No results found for this basename: CKTOTAL:3,CKMB:3,CKMBINDEX:3,TROPONINI:3 in the last 72 hours No components found with this basename: POCBNP:3 No results found for this basename: DDIMER:2 in the last 72 hours No results found for this basename: HGBA1C:2 in the last 72 hours No results found for this basename: CHOL:2,HDL:2,LDLCALC:2,TRIG:2,CHOLHDL:2,LDLDIRECT:2 in the last 72 hours No results found for this basename: TSH,T4TOTAL,FREET3,T3FREE,THYROIDAB in the last 72 hours No results found for this basename: VITAMINB12:2,FOLATE:2,FERRITIN:2,TIBC:2,IRON:2,RETICCTPCT:2 in the last 72 hours  Micro Results: Recent Results (from the past 240 hour(s))  CULTURE, BLOOD (ROUTINE X 2)     Status: Normal (Preliminary result)   Collection Time   11/16/11  4:27 PM      Component Value Range Status Comment   Specimen Description BLOOD RIGHT ARM   Final    Special Requests BOTTLES DRAWN AEROBIC AND ANAEROBIC   Final    Culture  Setup Time [id number]   Final    Culture     Final    Value:        BLOOD CULTURE RECEIVED NO GROWTH TO DATE CULTURE WILL BE HELD FOR 5 DAYS BEFORE ISSUING A FINAL NEGATIVE REPORT   Report Status PENDING   Incomplete   CULTURE, BLOOD (ROUTINE X 2)     Status: Normal (Preliminary result)   Collection Time   11/16/11  4:35 PM      Component Value Range Status Comment   Specimen Description BLOOD LEFT WRIST   Final    Special Requests BOTTLES DRAWN AEROBIC AND ANAEROBIC   Final    Culture  Setup Time [id number]   Final    Culture     Final    Value:        BLOOD CULTURE RECEIVED NO GROWTH TO DATE CULTURE WILL BE HELD FOR 5 DAYS BEFORE ISSUING A FINAL NEGATIVE REPORT   Report Status PENDING   Incomplete   CULTURE, RESPIRATORY     Status: Normal   Collection Time   11/16/11 11:00 PM      Component Value Range Status Comment   Specimen Description SPUTUM   Final     Special Requests NONE   Final    Gram Stain     Final    Value: FEW WBC PRESENT, PREDOMINANTLY PMN     NO SQUAMOUS EPITHELIAL CELLS SEEN     NO ORGANISMS SEEN   Culture NORMAL OROPHARYNGEAL FLORA   Final    Report Status 11/19/2011 FINAL   Final   CULTURE, EXPECTORATED SPUTUM-ASSESSMENT     Status: Normal   Collection Time   11/16/11 11:02 PM      Component Value Range Status Comment   Specimen Description SPUTUM EXPECTORATED   Final    Special Requests NONE   Final    Sputum evaluation     Final    Value: THIS SPECIMEN IS ACCEPTABLE. RESPIRATORY CULTURE REPORT TO FOLLOW.   Report Status 11/16/2011 FINAL   Final   MRSA PCR SCREENING  Status: Normal   Collection Time   11/17/11 12:20 AM      Component Value Range Status Comment   MRSA by PCR NEGATIVE  NEGATIVE  Final   CULTURE, BLOOD (ROUTINE X 2)     Status: Normal (Preliminary result)   Collection Time   11/21/11  3:25 AM      Component Value Range Status Comment   Specimen Description BLOOD RIGHT HAND   Final    Special Requests BOTTLES DRAWN AEROBIC ONLY 1 CC   Final    Culture  Setup Time [id number]   Final    Culture     Final    Value:        BLOOD CULTURE RECEIVED NO GROWTH TO DATE CULTURE WILL BE HELD FOR 5 DAYS BEFORE ISSUING A FINAL NEGATIVE REPORT   Report Status PENDING   Incomplete   CULTURE, BLOOD (ROUTINE X 2)     Status: Normal (Preliminary result)   Collection Time   11/21/11  3:30 AM      Component Value Range Status Comment   Specimen Description BLOOD RIGHT ANTECUBITAL   Final    Special Requests BOTTLES DRAWN AEROBIC AND ANAEROBIC 4 CC EA   Final    Culture  Setup Time [id number]   Final    Culture     Final    Value:        BLOOD CULTURE RECEIVED NO GROWTH TO DATE CULTURE WILL BE HELD FOR 5 DAYS BEFORE ISSUING A FINAL NEGATIVE REPORT   Report Status PENDING   Incomplete     Studies/Results: No results found.  Medications:     . albuterol  2.5 mg Nebulization Q6H  . antiseptic oral rinse  15 mL  Mouth Rinse BID  . aspirin EC  81 mg Oral Daily  . ceFEPime (MAXIPIME) IV  1 g Intravenous Q12H  . chlorhexidine  15 mL Mouth Rinse BID  . enoxaparin (LOVENOX) injection  40 mg Subcutaneous QHS  . feeding supplement  1 Container Oral QAC supper  . gabapentin  100 mg Oral TID  . guaiFENesin  1,200 mg Oral BID  . insulin aspart  0-9 Units Subcutaneous Q4H  . magnesium sulfate 1 - 4 g bolus IVPB  3 g Intravenous Once  . metoprolol tartrate  25 mg Oral BID  . pantoprazole  40 mg Oral Q1200  . potassium chloride  40 mEq Oral Q4H  . sodium chloride  3 mL Intravenous Q12H  . DISCONTD: vancomycin  500 mg Intravenous Q12H    Assessment: Principal Problem:  *Sepsis Active Problems:  Acute respiratory failure  Healthcare-associated pneumonia  Quadriplegia  MS (multiple sclerosis)  Hypokalemia  Tachycardia  Low TSH level  Anemia  Hypothermia   Plan: #1. Sepsis Likely secondary to HCAP. Patient is currently afebrile and  Hypothermia resolved after using bear hugger with WBC trending down. Patient's tachycardia improved with beta blocker and on 4L [place]. Urine strep pneum antigen is negative. Blood cultures are pending. Sputum is pending. UA is negative. Urine legionella antigen is negative. Lactic acid level was 2.1. Procalcitonin is <0.10. Continue empiric IV Vancomycin, cefepime and Levaquin. Follow  #2. Hypothermia Questionable etiology. Likely secondary to #1. Resolved. Repeat blood cultures pending.  #3 acute respiratory failure Likely secondary to healthcare associated pneumonia in the setting of advanced multiple sclerosis. Sputum Gram stain and cultures pending. Blood cultures are pending. Urine strep pneum antigen  is negative. Urine Legionella antigen is negative. We'll try to wean O2.  Clinincally improved. Continue empiric IVcefepime . DC vancomycin. Continue nebs. Continue chest PT. next one to 2 days may consider changing IV cefepime to oral Augmentin to complete a course of  antibiotic therapy.  #4 healthcare associated pneumonia  Clinical improvement. Sputum Gram stain and cultures pending. Urine strep pneumococcus antigen is negative. Urine Legionella antigen is negative. WBC is trending down. Continue empiric IV  Cefepime . D/C Vancomycin. Continue Mucinex. Continue nebs, chest PT. Patient  MBS with minimal dysphagia without aspiration or penetration of any consistency. Speech therapy ff. Next one to 2 days may consider switching patient from IV cefepime to oral Augmentin.  #5 hypokalemia Likely secondary to a dose of diuretics given yesterday. Replete. Replete Magnesium.  #6 tachycardia Likely secondary to sepsis vs hyperthyroidism. Cardiac enzymes negative x3.  TSH is low at 0.029,  free T4 WNLand T3 decreased with free T3 minimally decreased Improved on increased dose lopressor. Follow.  #7 multiple sclerosis/quadriplegia Supportive care. MBS with minimal dysphagia without aspiration ortoday. Speech therapy ff.  #8 Well controlled diabetes Hemoglobin A1c = 6.7. Continue sliding scale insulin.  #9 hypertension Continue Lopressor.  #10 Low TSH Likely secondary to current infection/ euthyroid sick syndrome.Free T4 WNL and T3 decreased. Will need repeat TFTs in 4-6 weeks post discharge.  #11 gastroesophageal reflux disease PPI  #12  Iron deficiency Anemia Likely dilutional component. No overt GIB. Anemia panel c/w Iron def anemia. May need iron supplementation on discharge. Will give a few doses of IV iron while in house. Follow H/H. #13 prophylaxis Protonix for GI prophylaxis, Lovenox for DVT prophylaxis. #14 Dispo Not ready for discharge yet   LOS: 6 days   Mirna Sutcliffe [phone] p 11/22/2011, 1:37 PM

## 2011-11-22 NOTE — Progress Notes (Signed)
Inpatient Diabetes Program Recommendations  AACE/ADA: New Consensus Statement on Inpatient Glycemic Control  Target Ranges:  Prepandial:   less than 140 mg/dL      Peak postprandial:   less than 180 mg/dL (1-2 hours)      Critically ill patients:  140 - 180 mg/dL  Pager:  [phone] Hours:  8 am-10pm   Reason for Visit: Elevated prandial glucose at dinner and bedtime:  Results for [name], [name] (MRN [id number]) as of 11/22/2011 15:22  Ref. Range 11/21/2011 07:15 11/21/2011 11:57 11/21/2011 16:04 11/21/2011 20:07  Glucose-Capillary Latest Range: 70-99 mg/dL 295 (H) 284 (H) 132 (H) 228 (H)    Inpatient Diabetes Program Recommendations Insulin - Meal Coverage: Consider adding meal coverage Novolog 4 units with lunch and dinner

## 2011-11-22 NOTE — Progress Notes (Signed)
Pt refused chest vest tx at this time.

## 2011-11-22 NOTE — Progress Notes (Signed)
Pt's catheter leaking around site, pads saturated x2. NP on call notified.  Order given to replace current foley with larger foley.  New foley inserted 16 FR.  Will continue to monitor. Newman Nip [place]

## 2011-11-22 NOTE — Progress Notes (Signed)
Patient transferred to this CSW. Patient was private pay at [facility], they would like CSW to attempt to obtain auth prior to discharge. CSW to do same. Will follow for discharge.  Alfa Leibensperger C. Stela Iwasaki MSW, LCSW [phone]

## 2011-11-22 NOTE — Progress Notes (Signed)
Speech Language Pathology Dysphagia Treatment Patient Details Name: [name] MRN: [id number] DOB: 1934/09/17 Today's Date: 11/22/2011 Time: 4098-1191 SLP Time Calculation (min): 35 min  Assessment / Plan / Recommendation Clinical Impression  No clinical s/s of aspiration with intake given today - 2 boluses applesauce, 3 straw sips water, two small cracker bites.  Pt reports swallow to be at baseline - states coughing with liquids and pharyngeal stasis that she had previously experienced has resolved.  SLP educated pt to aspiration precautions and risk factors for aspiration pna, as well as recommendations for pt to consider making decision with family,primary MD regarding feeding options in future if dysphagia progresses to ongoing overt aspiration.  Pt agreeable to continue current diet with precautions - including consuming water with meals due to pH neutral status.  Pt without denture adhesive - asked daughter to bring in previously - pt states she is going home - so doesn't need it.  SLP placed denture adhesive (from SLPs tube) on dentures after cleaning them today.  Rec pt have follow up SLP at [facility] for dysphagia management. No further SLP needs in the acute setting as pt is on modified diet and has chronic dysphagia- Thanks for the consult.     Diet Recommendation  Continue with Current Diet: Dysphagia 3 (mechanical soft);Thin liquid (water with meals)    SLP Plan All goals met   Pertinent Vitals/Pain Rhonchi, now off oxygen, afebrile   Swallowing Goals  SLP Swallowing Goals Swallow Study Goal #1 - Progress: Met Swallow Study Goal #2 - Progress: Met  General Temperature Spikes Noted: Yes (low grade 99) Behavior/Cognition: Alert;Cooperative;Pleasant mood Oral Cavity - Dentition: Dentures, bottom;Dentures, top (slp put adhesive in dentures with pt permission) Patient Positioning: Postural control interferes with function (due to pt pain - able to sit up approx 60*)  Oral Cavity -  Oral Hygiene   slp brushed dentures and placed in mouth with adhesive  Dysphagia Treatment Treatment focused on: Skilled observation of diet tolerance;Patient/family/caregiver education;Utilization of compensatory strategies Treatment Methods/Modalities: Skilled observation Patient observed directly with PO's: Yes Type of PO's observed: Regular;Thin liquids Feeding: Total assist Liquids provided via: Straw Pharyngeal Phase Signs & Symptoms: Suspected delayed swallow initiation Type of cueing: Verbal Amount of cueing: Minimal   Donavan Burnet, MS [facility] SLP [phone]

## 2011-11-23 DIAGNOSIS — J9600 Acute respiratory failure, unspecified whether with hypoxia or hypercapnia: Secondary | ICD-10-CM

## 2011-11-23 DIAGNOSIS — D696 Thrombocytopenia, unspecified: Secondary | ICD-10-CM

## 2011-11-23 DIAGNOSIS — A413 Sepsis due to Hemophilus influenzae: Secondary | ICD-10-CM

## 2011-11-23 DIAGNOSIS — T68XXXA Hypothermia, initial encounter: Secondary | ICD-10-CM

## 2011-11-23 LAB — CULTURE, BLOOD (ROUTINE X 2): Culture: NO GROWTH

## 2011-11-23 LAB — CBC
HCT: 30.9 % — ABNORMAL LOW (ref 36.0–46.0)
Hemoglobin: 10 g/dL — ABNORMAL LOW (ref 12.0–15.0)
RBC: 3.74 MIL/uL — ABNORMAL LOW (ref 3.87–5.11)
RDW: 14.8 % (ref 11.5–15.5)
WBC: 7.3 10*3/uL (ref 4.0–10.5)

## 2011-11-23 LAB — BASIC METABOLIC PANEL
Chloride: 107 mEq/L (ref 96–112)
GFR calc Af Amer: >90 mL/min (ref >90)
Potassium: 4.9 mEq/L (ref 3.5–5.1)
Sodium: 138 mEq/L (ref 135–145)

## 2011-11-23 LAB — GLUCOSE, CAPILLARY: Glucose-Capillary: 178 mg/dL — ABNORMAL HIGH (ref 70–99)

## 2011-11-23 MED ORDER — INSULIN ASPART 100 UNIT/ML ~~LOC~~ SOLN
4.0000 [IU] | Freq: Two times a day (BID) | SUBCUTANEOUS | Status: DC
  Administered 2011-11-23: 4 [IU] via SUBCUTANEOUS

## 2011-11-23 MED ORDER — ALBUTEROL SULFATE (5 MG/ML) 0.5% IN NEBU
2.5000 mg | INHALATION_SOLUTION | Freq: Three times a day (TID) | RESPIRATORY_TRACT | Status: DC
  Filled 2011-11-23 (×2): qty 0.5

## 2011-11-23 MED ORDER — MOXIFLOXACIN HCL 400 MG PO TABS
400.0000 mg | ORAL_TABLET | Freq: Every day | ORAL | Status: DC
  Administered 2011-11-24: 400 mg via ORAL
  Filled 2011-11-23: qty 1

## 2011-11-23 NOTE — Progress Notes (Signed)
Pt was seen and examined at bedside. I have reviewed labs and vitals which are stable and pt is hemodynamically stable. I agree with the above's assessment and plan.  Pt IV line infiltrated, pt is stable Will d/c IV abc and switch to oral Avelox  MAGICK-Trinidad Ingle [facility], pager #: [phone] Main office number: [phone]

## 2011-11-23 NOTE — Progress Notes (Signed)
Subjective: Lying in bed awake alert. NAD  Objective: Vital signs Filed Vitals:   11/22/11 1950 11/22/11 2132 11/23/11 0510 11/23/11 0750  BP:  132/77 117/67   Pulse:  110 89   Temp:  98 F (36.7 C) 98 F (36.7 C)   TempSrc:  Oral Oral   Resp:  20 19   Height:      Weight:   52.3 kg (115 lb 4.8 oz)   SpO2: 85% 93% 91% 93%   Weight change: -2.4 kg (-5 lb 4.7 oz) Last BM Date: 11/22/11  Intake/Output from previous day: 05/14 0701 - 05/15 0700 In: 1385 [P.O.:140; I.V.:1195; IV Piggyback:50] Out: 3901 [Urine:3900; Stool:1] Total I/O In: 120 [P.O.:120] Out: -    Physical Exam: General: Alert, awake, oriented x3, in no acute distress. HEENT: No bruits, no goiter. Mucus membranes mouth moist slightly pale. PERRL Heart: Regular rate and rhythm, without murmurs, rubs, gallops. Lungs:Normal effort but shallow.. Breath sounds with mild rhonchi.  Abdomen: Soft, nontender, nondistended, positive bowel sounds. Extremities: No clubbing cyanosis or edema with positive pedal pulses. Neuro: Grossly intact, nonfocal.    Lab Results: Basic Metabolic Panel:  Basename 11/23/11 0500 11/22/11 1600 11/22/11 0450  NA 138 138 --  K 4.9 3.6 --  CL 107 107 --  CO2 24 24 --  GLUCOSE 126* 179* --  BUN 5* 4* --  CREATININE 0.23* <0.20* --  CALCIUM 8.5 8.9 --  MG 1.9 -- 1.4*  PHOS -- -- --   Liver Function Tests: No results found for this basename: AST:2,ALT:2,ALKPHOS:2,BILITOT:2,PROT:2,ALBUMIN:2 in the last 72 hours No results found for this basename: LIPASE:2,AMYLASE:2 in the last 72 hours No results found for this basename: AMMONIA:2 in the last 72 hours CBC:  Basename 11/23/11 0500 11/22/11 0450  WBC 7.3 9.5  NEUTROABS -- --  HGB 10.0* 9.6*  HCT 30.9* 29.7*  MCV 82.6 82.7  PLT 434* 395   Cardiac Enzymes: No results found for this basename: CKTOTAL:3,CKMB:3,CKMBINDEX:3,TROPONINI:3 in the last 72 hours BNP: No results found for this basename: PROBNP:3 in the last 72  hours D-Dimer: No results found for this basename: DDIMER:2 in the last 72 hours CBG:  Basename 11/23/11 1151 11/23/11 0742 11/23/11 0413 11/22/11 2353 11/22/11 2009 11/22/11 1717  GLUCAP 178* 134* 121* 165* 209* 145*   Hemoglobin A1C: No results found for this basename: HGBA1C in the last 72 hours Fasting Lipid Panel: No results found for this basename: CHOL,HDL,LDLCALC,TRIG,CHOLHDL,LDLDIRECT in the last 72 hours Thyroid Function Tests: No results found for this basename: TSH,T4TOTAL,FREET4,T3FREE,THYROIDAB in the last 72 hours Anemia Panel: No results found for this basename: VITAMINB12,FOLATE,FERRITIN,TIBC,IRON,RETICCTPCT in the last 72 hours Coagulation: No results found for this basename: LABPROT:2,INR:2 in the last 72 hours Urine Drug Screen: Drugs of Abuse  No results found for this basename: labopia, cocainscrnur, labbenz, amphetmu, thcu, labbarb    Alcohol Level: No results found for this basename: ETH:2 in the last 72 hours Urinalysis: No results found for this basename: COLORURINE:2,APPERANCEUR:2,LABSPEC:2,PHURINE:2,GLUCOSEU:2,HGBUR:2,BILIRUBINUR:2,KETONESUR:2,PROTEINUR:2,UROBILINOGEN:2,NITRITE:2,LEUKOCYTESUR:2 in the last 72 hours Misc. Labs:  Recent Results (from the past 240 hour(s))  CULTURE, BLOOD (ROUTINE X 2)     Status: Normal   Collection Time   11/16/11  4:27 PM      Component Value Range Status Comment   Specimen Description BLOOD RIGHT ARM   Final    Special Requests BOTTLES DRAWN AEROBIC AND ANAEROBIC   Final    Culture  Setup Time [id number]   Final    Culture NO GROWTH 5 DAYS  Final    Report Status 11/23/2011 FINAL   Final   CULTURE, BLOOD (ROUTINE X 2)     Status: Normal   Collection Time   11/16/11  4:35 PM      Component Value Range Status Comment   Specimen Description BLOOD LEFT WRIST   Final    Special Requests BOTTLES DRAWN AEROBIC AND ANAEROBIC   Final    Culture  Setup Time [id number]   Final    Culture NO GROWTH 5 DAYS   Final     Report Status 11/23/2011 FINAL   Final   CULTURE, RESPIRATORY     Status: Normal   Collection Time   11/16/11 11:00 PM      Component Value Range Status Comment   Specimen Description SPUTUM   Final    Special Requests NONE   Final    Gram Stain     Final    Value: FEW WBC PRESENT, PREDOMINANTLY PMN     NO SQUAMOUS EPITHELIAL CELLS SEEN     NO ORGANISMS SEEN   Culture NORMAL OROPHARYNGEAL FLORA   Final    Report Status 11/19/2011 FINAL   Final   CULTURE, EXPECTORATED SPUTUM-ASSESSMENT     Status: Normal   Collection Time   11/16/11 11:02 PM      Component Value Range Status Comment   Specimen Description SPUTUM EXPECTORATED   Final    Special Requests NONE   Final    Sputum evaluation     Final    Value: THIS SPECIMEN IS ACCEPTABLE. RESPIRATORY CULTURE REPORT TO FOLLOW.   Report Status 11/16/2011 FINAL   Final   MRSA PCR SCREENING     Status: Normal   Collection Time   11/17/11 12:20 AM      Component Value Range Status Comment   MRSA by PCR NEGATIVE  NEGATIVE  Final   CULTURE, BLOOD (ROUTINE X 2)     Status: Normal (Preliminary result)   Collection Time   11/21/11  3:25 AM      Component Value Range Status Comment   Specimen Description BLOOD RIGHT HAND   Final    Special Requests BOTTLES DRAWN AEROBIC ONLY 1 CC   Final    Culture  Setup Time [id number]   Final    Culture     Final    Value:        BLOOD CULTURE RECEIVED NO GROWTH TO DATE CULTURE WILL BE HELD FOR 5 DAYS BEFORE ISSUING A FINAL NEGATIVE REPORT   Report Status PENDING   Incomplete   CULTURE, BLOOD (ROUTINE X 2)     Status: Normal (Preliminary result)   Collection Time   11/21/11  3:30 AM      Component Value Range Status Comment   Specimen Description BLOOD RIGHT ANTECUBITAL   Final    Special Requests BOTTLES DRAWN AEROBIC AND ANAEROBIC 4 CC EA   Final    Culture  Setup Time [id number]   Final    Culture     Final    Value:        BLOOD CULTURE RECEIVED NO GROWTH TO DATE CULTURE WILL BE HELD FOR 5 DAYS BEFORE  ISSUING A FINAL NEGATIVE REPORT   Report Status PENDING   Incomplete     Studies/Results: No results found.  Medications: Scheduled Meds:   . albuterol  2.5 mg Nebulization Q6H  . antiseptic oral rinse  15 mL Mouth Rinse BID  . aspirin EC  81 mg Oral  Daily  . ceFEPime (MAXIPIME) IV  1 g Intravenous Q12H  . chlorhexidine  15 mL Mouth Rinse BID  . enoxaparin (LOVENOX) injection  40 mg Subcutaneous QHS  . feeding supplement  1 Container Oral QAC supper  . ferric gluconate (FERRLECIT/NULECIT) IV  250 mg Intravenous QODAY  . gabapentin  100 mg Oral TID  . guaiFENesin  1,200 mg Oral BID  . insulin aspart  0-9 Units Subcutaneous Q4H  . magnesium sulfate 1 - 4 g bolus IVPB  3 g Intravenous Once  . metoprolol tartrate  25 mg Oral BID  . pantoprazole  40 mg Oral Q1200  . potassium chloride  40 mEq Oral Once  . sodium chloride  3 mL Intravenous Q12H  . DISCONTD: vancomycin  500 mg Intravenous Q12H   Continuous Infusions:   . sodium chloride 50 mL/hr at 11/22/11 1846   PRN Meds:.acetaminophen, acetaminophen, albuterol, ALPRAZolam, ibuprofen, ondansetron (ZOFRAN) IV, ondansetron  Assessment/Plan:  Principal Problem:  *Sepsis Active Problems:  Acute respiratory failure  Healthcare-associated pneumonia  Quadriplegia  MS (multiple sclerosis)  Hypokalemia  Tachycardia  Low TSH level  Anemia  Hypothermia #1. Sepsis  Likely secondary to HCAP. Patient  afebrile and Hypothermia resolved after using bear hugger with WBC WNL. Patient's HR 80's with beta blocker and on 2L [place]. Urine strep pneum antigen is negative. Blood cultures no growth to date. Sputum is pending. UA is negative. Urine legionella antigen is negative.   IV Vancomycin discontinued 11/22/11, continue cefepime and Levaquin. Follow  #2. Hypothermia  Questionable etiology. Likely secondary to #1. Resolved. Repeat blood cultures pending.  #3 acute respiratory failure  Likely secondary to healthcare associated pneumonia in the  setting of advanced multiple sclerosis. Sputum Gram stain and cultures pending. Blood cultures are no growth. Urine strep pneum antigen is negative. Urine Legionella antigen is negative.  Clinincally improved. Sats >90 on 2L. Continue empiric IVcefepime . DC vancomycin. Continue nebs. Continue chest PT. next one to 2 days may consider changing IV cefepime to oral Augmentin to complete a course of antibiotic therapy.  #4 healthcare associated pneumonia  Clinical improvement. Sputum Gram stain and cultures pending. Urine strep pneumococcus antigen is negative. Urine Legionella antigen is negative. WBC WNL. Continue empiric IV Cefepime .  Continue Mucinex. Continue nebs, chest PT. Patient MBS with minimal dysphagia without aspiration or penetration of any consistency. Speech therapy ff. Next one to 2 days may consider switching patient from IV cefepime to oral Augmentin.  #5 hypokalemia  Likely secondary to a dose of diuretics given 5/13. Resolved. Will monitor.   #6 tachycardia  Likely secondary to sepsis vs hyperthyroidism. Resolved. Cardiac enzymes negative x3. TSH is low at 0.029, free T4 WNLand T3 decreased with free T3 minimally decreased Improved on increased dose lopressor. Follow.  #7 multiple sclerosis/quadriplegia  Supportive care. MBS with minimal dysphagia without aspiration ortoday. Speech therapy ff.  #8 Well controlled diabetes  Hemoglobin A1c = 6.7. Continue sliding scale insulin.  #9 hypertension  Continue Lopressor.  #10 Low TSH  Likely secondary to current infection/ euthyroid sick syndrome.Free T4 WNL and T3 decreased. Will need repeat TFTs in 4-6 weeks post discharge.  #11 gastroesophageal reflux disease  PPI  #12 Iron deficiency Anemia  Likely dilutional component. No overt GIB. Anemia panel c/w Iron def anemia. May need iron supplementation on discharge. H&H stable. Monitor  #13 prophylaxis  Protonix for GI prophylaxis, Lovenox for DVT prophylaxis.  #14 Dispo  Anxious for  discharge. Will monitor closely. If continues to  improve, hopefully 24-48 hours.        LOS: 7 days   [facility] M 11/23/2011, 1:07 PM

## 2011-11-23 NOTE — Care Management Note (Signed)
    Page 1 of 1   11/24/2011     1:13:53 PM   CARE MANAGEMENT NOTE 11/24/2011  Patient:  [name], [name]   Account Number:  [id number]  Date Initiated:  11/17/2011  Documentation initiated by:  DAVIS,RHONDA  Subjective/Objective Assessment:   patient with history of ms and quad for over 1 year from local snf, presented with sob and wob, cxr-lll pna 02 on admission to ed 89%.     Action/Plan:   snf   Anticipated DC Date:  11/24/2011   Anticipated DC Plan:  SKILLED NURSING FACILITY  In-house referral  Clinical Social Worker      DC Planning Services  NA      [facility] Choice  NA   Choice offered to / List presented to:  NA   DME arranged  NA      DME agency  NA     HH arranged  NA      HH agency  NA   Status of service:  Completed, signed off Medicare Important Message given?  NA - LOS <3 / Initial given by admissions (If response is "NO", the following Medicare IM given date fields will be blank) Date Medicare IM given:   Date Additional Medicare IM given:    Discharge Disposition:  SKILLED NURSING FACILITY  Per UR Regulation:  Reviewed for med. necessity/level of care/duration of stay  If discussed at Long Length of Stay Meetings, dates discussed:   11/23/2011    Comments:  11/23/11 Micheala Morissette RN,BSN NCM [phone] SLOWLY IMPROVING.D/C PLAN RETURN SNF[facility].  [id number] Earlene Plater, RN, BSN, CCM beta blocker being readjusted for tachycardia, being transferred to telemtery acute bed [id number]/ still period of hypothermia/ hcap/ lives at [facility] No discharge needs present at time of this review at the sdu/icu level. Case Management [id number]   [id number] Earlene Plater, RN, BSN, CCM No discharge needs present at time of this review at the sdu/icu level. Case Management [phone]

## 2011-11-24 DIAGNOSIS — J9600 Acute respiratory failure, unspecified whether with hypoxia or hypercapnia: Secondary | ICD-10-CM

## 2011-11-24 DIAGNOSIS — A413 Sepsis due to Hemophilus influenzae: Secondary | ICD-10-CM

## 2011-11-24 DIAGNOSIS — D696 Thrombocytopenia, unspecified: Secondary | ICD-10-CM

## 2011-11-24 DIAGNOSIS — R680 Hypothermia, not associated with low environmental temperature: Secondary | ICD-10-CM

## 2011-11-24 LAB — BASIC METABOLIC PANEL
BUN: 6 mg/dL (ref 6–23)
Chloride: 106 mEq/L (ref 96–112)
GFR calc non Af Amer: >90 mL/min (ref >90)
Glucose, Bld: 145 mg/dL — ABNORMAL HIGH (ref 70–99)
Potassium: 3.6 mEq/L (ref 3.5–5.1)

## 2011-11-24 LAB — CBC
HCT: 32.1 % — ABNORMAL LOW (ref 36.0–46.0)
Hemoglobin: 10.3 g/dL — ABNORMAL LOW (ref 12.0–15.0)
MCHC: 32.1 g/dL (ref 30.0–36.0)

## 2011-11-24 LAB — GLUCOSE, CAPILLARY
Glucose-Capillary: 128 mg/dL — ABNORMAL HIGH (ref 70–99)
Glucose-Capillary: 151 mg/dL — ABNORMAL HIGH (ref 70–99)
Glucose-Capillary: 153 mg/dL — ABNORMAL HIGH (ref 70–99)

## 2011-11-24 MED ORDER — GUAIFENESIN ER 600 MG PO TB12: 1200.0000 mg | ORAL_TABLET | Freq: Two times a day (BID) | ORAL | Status: DC

## 2011-11-24 MED ORDER — ENSURE PUDDING PO PUDG: 1.0000 | Freq: Every day | ORAL | Status: DC

## 2011-11-24 MED ORDER — METOPROLOL TARTRATE 25 MG PO TABS: 25.0000 mg | ORAL_TABLET | Freq: Two times a day (BID) | ORAL | Status: DC

## 2011-11-24 MED ORDER — MOXIFLOXACIN HCL 400 MG PO TABS: ORAL_TABLET | ORAL | Status: DC

## 2011-11-24 NOTE — Clinical Social Work Placement (Unsigned)
     Clinical Social Work Department CLINICAL SOCIAL WORK PLACEMENT NOTE 11/24/2011  Patient:  [name], [name]  Account Number:  [id number] Admit date:  11/16/2011  Clinical Social Worker:  Jodelle Red  Date/time:  11/17/2011 10:14 AM  Clinical Social Work is seeking post-discharge placement for this patient at the following level of care:   SKILLED NURSING   (*CSW will update this form in Epic as items are completed)   11/17/2011  Patient/family provided with [facility] Department of Clinical Social Works list of facilities offering this level of care within the geographic area requested by the patient (or if unable, by the patients family).  11/17/2011  Patient/family informed of their freedom to choose among providers that offer the needed level of care, that participate in Medicare, Medicaid or managed care program needed by the patient, have an available bed and are willing to accept the patient.  11/17/2011  Patient/family informed of [facility] ownership interest in [facility], as well as of the fact that they are under no obligation to receive care at this facility.  PASARR submitted to EDS on 11/17/2011 PASARR number received from EDS on   FL2 transmitted to all facilities in geographic area requested by pt/family on   FL2 transmitted to all facilities within larger geographic area on   Patient informed that his/her managed care company has contracts with or will negotiate with  certain facilities, including the following:     Patient/family informed of bed offers received:   Patient chooses bed at  Physician recommends and patient chooses bed at    Patient to be transferred to  on   Patient to be transferred to facility by   The following physician request were entered in Epic:   Additional Comments: Pt wants to return to [facility] where she has been a resident for over one year.

## 2011-11-24 NOTE — Discharge Summary (Signed)
Physician Discharge Summary  Patient ID: [name] MRN: [id number] DOB/AGE: 1935-05-30 77 y.o.  Admit date: 11/16/2011 Discharge date: 11/24/2011  Primary Care Physician:  Brianna Paling, MD, MD   Discharge Diagnoses:  Sepsis secondary to HCAP  Principal Problem:  *Sepsis Active Problems:  Acute respiratory failure  Healthcare-associated pneumonia  Quadriplegia  MS (multiple sclerosis)  Hypokalemia  Tachycardia  Low TSH level  Anemia  Hypothermia   Medication List  As of 11/24/2011 12:55 PM   STOP taking these medications         sulfamethoxazole-trimethoprim 800-160 MG per tablet         TAKE these medications         aspirin 81 MG tablet   Take 81 mg by mouth daily.      atorvastatin 20 MG tablet   Commonly known as: LIPITOR   Take 20 mg by mouth See admin instructions. Takes 1 tablet daily on mondays, Wednesdays, and fridays.      cetirizine 10 MG tablet   Commonly known as: ZYRTEC   Take 10 mg by mouth daily.      cholecalciferol 1000 UNITS tablet   Commonly known as: VITAMIN D   Take 2,000 Units by mouth 2 (two) times daily.      DELZICOL 400 MG Cpdr   Generic drug: Mesalamine   Take 800 mg by mouth 3 (three) times daily.      feeding supplement Pudg   Take 1 Container by mouth daily before supper.      furosemide 40 MG tablet   Commonly known as: LASIX   Take 40 mg by mouth daily.      gabapentin 100 MG capsule   Commonly known as: NEURONTIN   Take 100 mg by mouth 3 (three) times daily.      glyBURIDE 2.5 MG tablet   Commonly known as: DIABETA   Take 2.5 mg by mouth 2 (two) times daily with a meal.      guaiFENesin 600 MG 12 hr tablet   Commonly known as: MUCINEX   Take 2 tablets (1,200 mg total) by mouth 2 (two) times daily.      ibuprofen 800 MG tablet   Commonly known as: ADVIL,MOTRIN   Take 800 mg by mouth every 8 (eight) hours as needed. pain      ipratropium-albuterol 0.5-2.5 (3) MG/3ML Soln   Commonly known as: DUONEB   Take 3  mLs by nebulization every 4 (four) hours as needed.      metFORMIN 750 MG 24 hr tablet   Commonly known as: GLUCOPHAGE-XR   Take 750 mg by mouth 2 (two) times daily.      metoprolol tartrate 25 MG tablet   Commonly known as: LOPRESSOR   Take 1 tablet (25 mg total) by mouth 2 (two) times daily.      moxifloxacin 400 MG tablet   Commonly known as: AVELOX   Take 1 tab daily for 3 more days. Last dose 5/19.13      pantoprazole 40 MG tablet   Commonly known as: PROTONIX   Take 40 mg by mouth daily.             Disposition and Follow-up: pt medically stable and ready for discharge to facility.   Consults:  Pt seen by PT/OT, Diabetes coordinator  Significant Diagnostic Studies:   Dg Chest Portable 1 View 11/16/2011    IMPRESSION:  Opacity throughout lung base suspicious for pneumonia.  Consider two-view chest x-ray.  Physical Exam  General: Alert, awake, oriented x3, in no acute distress.Quadraplegia  HEENT: No bruits, no goiter.  Heart: RRR, without murmurs, rubs, gallops.  Lungs:Normal effort. Breath sounds somewhat coarse diffuse but good air movement.  Shallow breaths  Abdomen: Soft, nontender, nondistended, positive bowel sounds.  Extremities: No clubbing cyanosis or edema with positive pedal pulses.  Neuro: Grossly intact, nonfocal.   Labs: Lab 11/24/11 0543 11/23/11 0500 11/22/11 0450 11/21/11 0330 11/20/11 0515 11/18/11 0335  WBC 7.7 7.3 9.5 9.5 8.7 --  HGB 10.3* 10.0* 9.6* 9.4* 9.5* --  HCT 32.1* 30.9* 29.7* 29.6* 30.5* --  PLT 512* 434* 395 329 344 --   Lab 11/24/11 0543 11/23/11 0500 11/22/11 1600 11/22/11 0450 11/21/11 0330 11/18/11 0335  NA 140 138 138 140 143 --  K 3.6 4.9 3.6 3.0* 3.3* --  CL 106 107 107 108 111 --  CO2 26 24 24 24 24  --  GLUCOSE 145* 126* 179* 158* 117* --  BUN 6 5* 4* 5* 7 --  CREATININE 0.25* 0.23* <0.20* 0.21* 0.23* --  CALCIUM 9.5 8.5 8.9 9.1 8.8 --  MG -- 1.9 -- 1.4* 1.4* 1.9   Cardiac markers:  Lab 11/18/11 0055 11/17/11  1730  CKMB 0.8 1.0  TROPONINI <0.30 <0.30  MYOGLOBIN -- --   TSH - Abnormal; Notable for the following:    TSH 0.029 (*)   HEMOGLOBIN A1C - Abnormal; Notable for the following:    Hemoglobin A1C 6.7 (*)   T3 - Abnormal; Notable for the following:    T3, Total 68.7 (*)    All other components within normal limits  T3, FREE - Abnormal; Notable for the following:    T3, Free 2.2 (*)     Brief H and P: For complete details please refer to admission H and P, but in brief  [name] is a 77 year old white female from skilled nursing facility with past history significant for advanced multiple sclerosis with quadriplegia, bedbound at baseline presents [facility] ED on [facility]/16/11 with cc of SOB. Patient and daughter reported intermittent cough and congestion for several weeks. 6 days prior to presentation she had increased cough congestion or shortness of breath and was diagnosed with pneumonia and started on moxifloxacin at the facility, despite this she reported increasing congestion shortness of breath and stuffiness, and presented to the ER 11/17/11 She denied any fevers, reported chills. Denied any abdominal pain nausea or vomiting, diarrhea or urinary symptoms. Upon evaluation the emergency room noted to have a white count of 18,000 with a left shift and chest x-ray concerning for basilar pneumonia. Pt admitted to medicine service  Hospital Course:   Principal Problem:  *Sepsis Active Problems:  Acute respiratory failure  Healthcare-associated pneumonia  Quadriplegia  MS (multiple sclerosis)  Hypokalemia  Tachycardia  Low TSH level  Anemia  Hypothermia  #1. Sepsis  Likely secondary to HCAP.  Pt admitted to SD unit and placed empirically on IV van, cefepime.  Due to pt's MS it was difficult to mobilize secretions. Chest physiotherapy started with frequent repositioning. She was actively suctioned daily. Her oxygen saturation levels improved with this treatment. Pan cultures negative,  blood culture negative. Pt did experience episode hypothermia and 2 more blood cultures drawn at that time. This resolved. Pt became tachycardic and BB increased and this resolved. Pt transferred to floor.  TSH decreased but T4 and T3 WNL. Felt to be due to euthyroid sick syndrome. Will need OP follow up TFT's.  Hypothermia resolved  after using bear hugger with WBC WNL. Patient's HR 80's with beta blocker and on 2L [place]. Urine strep pneum antigen is negative. Blood cultures no growth to date. Antibiotics changed to po and will complete 10 days on 11/27/11.  #2. Hypothermia  Questionable etiology. Likely secondary to #1. Resolved.  #3 acute respiratory failure  Likely secondary to healthcare associated pneumonia in the setting of advanced multiple sclerosis.  Blood cultures are no growth. Urine strep pneum antigen is negative. Urine Legionella antigen is negative. Clinincally improved. Sats >90 on 2L. Recieved empiric IVcefepime 7 vanc then switched to po. Will complete 10 days on 11/27/11.Also given  nebs. Recommend repositioning q 2 hours as this aids with secretion management.   #4 healthcare associated pneumonia  See #1. Sputum Gram stain and cultures pending. Urine strep pneumococcus antigen is negative. Urine Legionella antigen is negative. WBC WNL. Provided Mucinex & nebs, chest PT. Patient MBS with minimal dysphagia without aspiration or penetration of any consistency. Speech therapy ff.   #5 hypokalemia  Likely secondary to a dose of diuretics given 5/13. Resolved. Will monitor.  #6 tachycardia  Likely secondary to sepsis vs hyperthyroidism. Resolved. Cardiac enzymes negative x3. TSH is low at 0.029, free T4 WNLand T3 decreased with free T3 minimally decreased Improved on increased dose lopressor.   #7 multiple sclerosis/quadriplegia  Supportive care. MBS with minimal dysphagia without aspiration ortoday. Speech therapy ff.  #8 Well controlled diabetes  Hemoglobin A1c = 6.7. Managed with SSI during  hospitalization. Well controlled will resume home regimen  #9 hypertension   Continue Lopressor that was increased.  #10 Low TSH  Likely secondary to current infection/ euthyroid sick syndrome.Free T4 WNL and T3 decreased. Will need repeat TFTs in 4-6 weeks post discharge.  #11 gastroesophageal reflux disease  PPI Stable #12 Iron deficiency Anemia  Likely dilutional component. No overt GIB. Anemia panel c/w Iron def anemia. Repeat CBC 1week   Time spent on Discharge: 35 min  Signed: Gwenyth Bender 11/24/2011, 12:55 PM  MAGICK-Jaimin Krupka  [facility], pager #: [phone] Main office number: [phone]

## 2011-11-24 NOTE — Progress Notes (Signed)
Report called to [facility], [facility] transported pt to [facility]

## 2011-11-24 NOTE — Plan of Care (Signed)
Problem: Inadequate Intake (NI-2.1) Goal: Food and/or nutrient delivery Individualized approach for food/nutrient provision.  Outcome: Progressing Patient PO intake 75% at meals.

## 2011-11-24 NOTE — Progress Notes (Signed)
Attempted to call pt's daughter to update her on pt dispo, message left on work Engineer, technical sales.

## 2011-11-24 NOTE — Progress Notes (Signed)
Patient cleared for discharge. Patient accepted back to [facility]. Packet copied and placed in [place]. ptar called for transportation. Patient aware and agreeable.  Rhaya Coale C. Donya Hitch MSW, LCSW [phone]

## 2011-11-24 NOTE — Progress Notes (Signed)
Nutrition Follow-up  Diet Order:  Dysphagia 3 Diet  Patient with documented 75% PO intake at meals. Patient reported she likes the Ensure nutrition supplement provided. PO intake is improving.    Meds: Scheduled Meds:   . albuterol  2.5 mg Nebulization TID  . antiseptic oral rinse  15 mL Mouth Rinse BID  . aspirin EC  81 mg Oral Daily  . chlorhexidine  15 mL Mouth Rinse BID  . enoxaparin (LOVENOX) injection  40 mg Subcutaneous QHS  . feeding supplement  1 Container Oral QAC supper  . ferric gluconate (FERRLECIT/NULECIT) IV  250 mg Intravenous QODAY  . gabapentin  100 mg Oral TID  . guaiFENesin  1,200 mg Oral BID  . insulin aspart  0-9 Units Subcutaneous Q4H  . insulin aspart  4 Units Subcutaneous BID AC  . metoprolol tartrate  25 mg Oral BID  . moxifloxacin  400 mg Oral Daily  . pantoprazole  40 mg Oral Q1200  . sodium chloride  3 mL Intravenous Q12H  . DISCONTD: albuterol  2.5 mg Nebulization Q6H  . DISCONTD: ceFEPime (MAXIPIME) IV  1 g Intravenous Q12H   Continuous Infusions:   . DISCONTD: sodium chloride 50 mL/hr at 11/22/11 1846   PRN Meds:.acetaminophen, acetaminophen, albuterol, ALPRAZolam, ibuprofen, ondansetron (ZOFRAN) IV, ondansetron  Labs:  CMP     Component Value Date/Time   NA 140 11/24/2011 0543   K 3.6 11/24/2011 0543   CL 106 11/24/2011 0543   CO2 26 11/24/2011 0543   GLUCOSE 145* 11/24/2011 0543   BUN 6 11/24/2011 0543   CREATININE 0.25* 11/24/2011 0543   CALCIUM 9.5 11/24/2011 0543   PROT 8.3 11/17/2011 0530   ALBUMIN 2.8* 11/17/2011 0530   AST 8 11/17/2011 0530   ALT 5 11/17/2011 0530   ALKPHOS 72 11/17/2011 0530   BILITOT 0.2* 11/17/2011 0530   GFRNONAA >90 11/24/2011 0543   GFRAA >90 11/24/2011 0543     Intake/Output Summary (Last 24 hours) at 11/24/11 1055 Last data filed at 11/24/11 0527  Gross per 24 hour  Intake    240 ml  Output   1975 ml  Net  -1735 ml    Weight Status:  115 lb. Weight up 5 lb from weight on 5/9 of 110 lb.Marland Kitchen  Re-estimated needs  remain the same:   Kcal: 1125-1430  Protein: 61.36-76 grams  Fluid: 1 ml per kcal intake   Nutrition Dx:  -Inadequate oral intake (NI-2.1). Status: Ongoing, improving.   MONITORING/EVALUATION(Goals):  PO intake, weight trends, labs  1. PO intake > 75% at meals. -Meeting, continue 2. Minimize weight loss. - Meeting, Continue.    Intervention:  Will continue to provide ENsure pudding once daily.  RD to follow for nutrition plan of care.    Iven Finn [facility] Pager #:  [phone]

## 2011-11-24 NOTE — Progress Notes (Signed)
Spoke with Toma Copier SW to check status of pt d/c to SNF, per [facility] pt's paperwork has been faxed and chart is copied, plan is that SW will call for ambulance transport at 230pm today, Called and updated pt's daughter Selena Batten.

## 2011-11-27 LAB — CULTURE, BLOOD (ROUTINE X 2): Culture: NO GROWTH

## 2012-09-07 ENCOUNTER — Emergency Department (HOSPITAL_COMMUNITY): Payer: Medicare Other

## 2012-09-07 ENCOUNTER — Inpatient Hospital Stay (HOSPITAL_COMMUNITY)
Admission: EM | Admit: 2012-09-07 | Discharge: 2012-09-11 | DRG: 393 | Disposition: A | Discharge status: Skilled Nursing Facility | Payer: Medicare Other | Attending: Internal Medicine | Admitting: Internal Medicine

## 2012-09-07 ENCOUNTER — Encounter (HOSPITAL_COMMUNITY): Payer: Self-pay | Admitting: *Deleted

## 2012-09-07 DIAGNOSIS — E785 Hyperlipidemia, unspecified: Secondary | ICD-10-CM | POA: Diagnosis present

## 2012-09-07 DIAGNOSIS — G8250 Quadriplegia, unspecified: Secondary | ICD-10-CM | POA: Diagnosis present

## 2012-09-07 DIAGNOSIS — I959 Hypotension, unspecified: Secondary | ICD-10-CM | POA: Diagnosis present

## 2012-09-07 DIAGNOSIS — J189 Pneumonia, unspecified organism: Secondary | ICD-10-CM | POA: Diagnosis present

## 2012-09-07 DIAGNOSIS — Z87891 Personal history of nicotine dependence: Secondary | ICD-10-CM

## 2012-09-07 DIAGNOSIS — K5190 Ulcerative colitis, unspecified, without complications: Secondary | ICD-10-CM | POA: Diagnosis present

## 2012-09-07 DIAGNOSIS — E876 Hypokalemia: Secondary | ICD-10-CM

## 2012-09-07 DIAGNOSIS — E1110 Type 2 diabetes mellitus with ketoacidosis without coma: Secondary | ICD-10-CM | POA: Diagnosis present

## 2012-09-07 DIAGNOSIS — G35 Multiple sclerosis: Secondary | ICD-10-CM | POA: Diagnosis present

## 2012-09-07 DIAGNOSIS — R000 Tachycardia, unspecified: Secondary | ICD-10-CM | POA: Diagnosis present

## 2012-09-07 DIAGNOSIS — G35D Multiple sclerosis, unspecified: Secondary | ICD-10-CM | POA: Diagnosis present

## 2012-09-07 DIAGNOSIS — D649 Anemia, unspecified: Secondary | ICD-10-CM | POA: Diagnosis present

## 2012-09-07 DIAGNOSIS — Z7982 Long term (current) use of aspirin: Secondary | ICD-10-CM

## 2012-09-07 DIAGNOSIS — R7989 Other specified abnormal findings of blood chemistry: Secondary | ICD-10-CM

## 2012-09-07 DIAGNOSIS — D500 Iron deficiency anemia secondary to blood loss (chronic): Secondary | ICD-10-CM | POA: Diagnosis present

## 2012-09-07 DIAGNOSIS — I10 Essential (primary) hypertension: Secondary | ICD-10-CM | POA: Diagnosis present

## 2012-09-07 DIAGNOSIS — K922 Gastrointestinal hemorrhage, unspecified: Secondary | ICD-10-CM

## 2012-09-07 DIAGNOSIS — Z79899 Other long term (current) drug therapy: Secondary | ICD-10-CM

## 2012-09-07 DIAGNOSIS — K219 Gastro-esophageal reflux disease without esophagitis: Secondary | ICD-10-CM | POA: Diagnosis present

## 2012-09-07 DIAGNOSIS — E1310 Other specified diabetes mellitus with ketoacidosis without coma: Secondary | ICD-10-CM | POA: Diagnosis present

## 2012-09-07 DIAGNOSIS — F411 Generalized anxiety disorder: Secondary | ICD-10-CM | POA: Diagnosis present

## 2012-09-07 DIAGNOSIS — K644 Residual hemorrhoidal skin tags: Principal | ICD-10-CM | POA: Diagnosis present

## 2012-09-07 DIAGNOSIS — Z8701 Personal history of pneumonia (recurrent): Secondary | ICD-10-CM

## 2012-09-07 HISTORY — DX: Noninfective gastroenteritis and colitis, unspecified: K52.9

## 2012-09-07 HISTORY — DX: Difficulty in walking, not elsewhere classified: R26.2

## 2012-09-07 HISTORY — DX: Gastrointestinal hemorrhage, unspecified: K92.2

## 2012-09-07 LAB — CBC WITH DIFFERENTIAL/PLATELET
Basophils Absolute: 0 10*3/uL (ref 0.0–0.1)
Eosinophils Absolute: 0.1 10*3/uL (ref 0.0–0.7)
HCT: 25.4 % — ABNORMAL LOW (ref 36.0–46.0)
Lymphs Abs: 2.4 10*3/uL (ref 0.7–4.0)
MCH: 16.1 pg — ABNORMAL LOW (ref 26.0–34.0)
MCHC: 27.6 g/dL — ABNORMAL LOW (ref 30.0–36.0)
MCV: 58.3 fL — ABNORMAL LOW (ref 78.0–100.0)
Neutro Abs: 3.7 10*3/uL (ref 1.7–7.7)
RDW: 19.1 % — ABNORMAL HIGH (ref 11.5–15.5)

## 2012-09-07 LAB — URINALYSIS, ROUTINE W REFLEX MICROSCOPIC
Glucose, UA: NEGATIVE mg/dL
Nitrite: NEGATIVE
Protein, ur: NEGATIVE mg/dL
Urobilinogen, UA: 0.2 mg/dL (ref 0.0–1.0)

## 2012-09-07 LAB — ABO/RH: ABO/RH(D): O POS

## 2012-09-07 LAB — OCCULT BLOOD, POC DEVICE: Fecal Occult Bld: POSITIVE — AB

## 2012-09-07 LAB — COMPREHENSIVE METABOLIC PANEL
Albumin: 3.9 g/dL (ref 3.5–5.2)
BUN: 28 mg/dL — ABNORMAL HIGH (ref 6–23)
Creatinine, Ser: 0.35 mg/dL — ABNORMAL LOW (ref 0.50–1.10)
GFR calc Af Amer: >90 mL/min (ref >90)
Glucose, Bld: 157 mg/dL — ABNORMAL HIGH (ref 70–99)
Total Bilirubin: 0.1 mg/dL — ABNORMAL LOW (ref 0.3–1.2)
Total Protein: 7.8 g/dL (ref 6.0–8.3)

## 2012-09-07 LAB — URINE MICROSCOPIC-ADD ON

## 2012-09-07 LAB — PREPARE RBC (CROSSMATCH)

## 2012-09-07 MED ORDER — ACETAMINOPHEN 325 MG PO TABS: 650.0000 mg | ORAL_TABLET | Freq: Four times a day (QID) | ORAL | Status: DC | PRN

## 2012-09-07 MED ORDER — ZOLPIDEM TARTRATE 5 MG PO TABS
5.0000 mg | ORAL_TABLET | Freq: Every evening | ORAL | Status: DC | PRN
  Administered 2012-09-08 (×2): 5 mg via ORAL
  Filled 2012-09-07 (×2): qty 1

## 2012-09-07 MED ORDER — ONDANSETRON HCL 4 MG/2ML IJ SOLN: 4.0000 mg | Freq: Four times a day (QID) | INTRAMUSCULAR | Status: DC | PRN

## 2012-09-07 MED ORDER — ACETAMINOPHEN 650 MG RE SUPP: 650.0000 mg | Freq: Four times a day (QID) | RECTAL | Status: DC | PRN

## 2012-09-07 MED ORDER — SODIUM CHLORIDE 0.9 % IV SOLN
INTRAVENOUS | Status: DC
  Administered 2012-09-07: 22:00:00 via INTRAVENOUS
  Administered 2012-09-08: 1000 mL via INTRAVENOUS

## 2012-09-07 MED ORDER — ALUM & MAG HYDROXIDE-SIMETH 200-200-20 MG/5ML PO SUSP
30.0000 mL | Freq: Four times a day (QID) | ORAL | Status: DC | PRN
  Filled 2012-09-07: qty 30

## 2012-09-07 MED ORDER — ONDANSETRON HCL 4 MG PO TABS: 4.0000 mg | ORAL_TABLET | Freq: Four times a day (QID) | ORAL | Status: DC | PRN

## 2012-09-07 MED ORDER — OXYCODONE HCL 5 MG PO TABS: 5.0000 mg | ORAL_TABLET | ORAL | Status: DC | PRN

## 2012-09-07 MED ORDER — PANTOPRAZOLE SODIUM 40 MG IV SOLR
40.0000 mg | Freq: Two times a day (BID) | INTRAVENOUS | Status: DC
  Administered 2012-09-07 – 2012-09-09 (×5): 40 mg via INTRAVENOUS
  Filled 2012-09-07 (×8): qty 40

## 2012-09-07 MED ORDER — SODIUM CHLORIDE 0.9 % IV SOLN
INTRAVENOUS | Status: AC
  Administered 2012-09-07: 21:00:00 via INTRAVENOUS

## 2012-09-07 MED ORDER — HYDROMORPHONE HCL PF 1 MG/ML IJ SOLN: 0.5000 mg | INTRAMUSCULAR | Status: DC | PRN

## 2012-09-07 NOTE — H&P (Signed)
[facility] History and Physical  [name]:[id number] DOB: 09-19-1934 DOA: 09/07/2012  Referring physician: EDP PCP: Theodosia Paling, MD  Specialists:   Chief Complaint:  Low hemoglobin on Labs  HPI: [name] is a 78 y.o. female resident of the [facility] who was sent to the ED after labs returned with a low hemoglobin level of 7.0.   She reports not feeling well for the past week and at the facility labs were drawn and a chest x-ray was done because she has had pneumonia during this time of the year several times before in the past.    She denies having any nausea or vomiting or hematemesis or diarrhea, but in the ED she was found to have a heme Positive FOBT on rectal examination.  She also denied having chest pain SOB, and lightheadedness.  Her Gastroenterologist was contacted and she was referred for admission.   Dr.  Randa Evens her GI will see the patient in the AM.   THE EDP ordered 1 unit of blood for for transfusion.        Review of Systems: The patient denies anorexia, fever, chills, weight loss, vision loss, decreased hearing, hoarseness, chest pain, syncope, dyspnea on exertion, peripheral edema, balance deficits, hemoptysis, abdominal pain, nausea.vomiting, diarrhea, melena, hematochezia, hematemesis, severe indigestion/heartburn, hematuria, dysuria,  incontinence, muscle weakness, suspicious skin lesions, transient blindness, difficulty walking, depression, unusual weight change, abnormal bleeding, enlarged lymph nodes, angioedema, and breast masses.    Past Medical History  Diagnosis Date  . Hypertension   . Diabetes mellitus   . GERD (gastroesophageal reflux disease)   . Neuromuscular disorder   . Multiple sclerosis   . Anxiety   . Hyperlipemia   . Pneumonia   . Colitis   . Unable to ambulate    Past Surgical History  Procedure Laterality Date  . Tubal ligation    . Flexible sigmoidoscopy  10/05/2011    Procedure: FLEXIBLE SIGMOIDOSCOPY;  Surgeon:  Vertell Novak., MD;  Location: [facility];  Service: Endoscopy;  Laterality: N/A;   pt. coming from [phone] daughter phone no. [phone],daughter might come with her  sedation is needed, pt. is paraplegic    Medications:  HOME MEDS: Prior to Admission medications   Medication Sig Start Date End Date Taking? Authorizing Provider  aspirin 81 MG tablet Take 81 mg by mouth daily.   Yes Historical Provider, MD  atorvastatin (LIPITOR) 20 MG tablet Take 20 mg by mouth See admin instructions. Takes 1 tablet daily on mondays, Wednesdays, and fridays.   Yes Historical Provider, MD  cholecalciferol (VITAMIN D) 1000 UNITS tablet Take 2,000 Units by mouth 2 (two) times daily.   Yes Historical Provider, MD  furosemide (LASIX) 20 MG tablet Take 60 mg by mouth daily.   Yes Historical Provider, MD  gabapentin (NEURONTIN) 100 MG capsule Take 100 mg by mouth 3 (three) times daily.   Yes Historical Provider, MD  glyBURIDE (DIABETA) 2.5 MG tablet Take 2.5 mg by mouth 2 (two) times daily with a meal.    Yes Historical Provider, MD  hydrOXYzine (ATARAX/VISTARIL) 25 MG tablet Take 25 mg by mouth 3 (three) times daily as needed for itching.   Yes Historical Provider, MD  ipratropium (ATROVENT) 0.02 % nebulizer solution Take 500 mcg by nebulization 3 (three) times daily. For 3 days starting 09/07/12   Yes Historical Provider, MD  metFORMIN (GLUCOPHAGE-XR) 750 MG 24 hr tablet Take 750 mg by mouth 2 (two) times daily.  Yes Historical Provider, MD  metoprolol tartrate (LOPRESSOR) 25 MG tablet Take 1 tablet (25 mg total) by mouth 2 (two) times daily. 11/24/11 11/23/12 Yes Lesle Chris Black, NP  moxifloxacin (AVELOX) 400 MG tablet Take 400 mg by mouth daily. Take for 7 days starting 09/07/12   Yes Historical Provider, MD  pantoprazole (PROTONIX) 40 MG tablet Take 40 mg by mouth daily.   Yes Historical Provider, MD  polyethylene glycol (MIRALAX / GLYCOLAX) packet Take 17 g by mouth every other day.    Yes Historical Provider, MD  polyvinyl alcohol (LIQUIFILM TEARS) 1.4 % ophthalmic solution Place 1 drop into both eyes 3 (three) times daily as needed (dry eyes).   Yes Historical Provider, MD  Skin Protectants, Misc. (DIMETHICONE-ZINC OXIDE) cream Apply 1 application topically 2 (two) times daily as needed for dry skin.   Yes Historical Provider, MD  sulfaDIAZINE 500 MG tablet Take 1,000 mg by mouth 2 (two) times daily.   Yes Historical Provider, MD    Allergies:  Allergies  Allergen Reactions  . Contrast Media (Iodinated Diagnostic Agents)     Unknown  . Penicillins     Unknown    Social History: resident of [facility].     reports that she quit smoking about 13 years ago. She has never used smokeless tobacco. She reports that she does not drink alcohol or use illicit drugs.  Family History: Family History  Problem Relation Age of Onset  . CAD Mother   . Diabetes Daughter   . Diabetes Daughter   . Diabetes Son      Physical Exam:  GEN:  Pleasant 88 year old debilitated Elderly Caucasian  Female examined  and in no acute distress; cooperative with exam Filed Vitals:   09/07/12 2030 09/07/12 2036 09/07/12 2045 09/07/12 2100  BP: 107/66 107/66 109/64 107/66  Pulse: 117 122 116 120  Temp:   98 F (36.7 C)   TempSrc:   Oral   Resp: 23 26 23 20   SpO2: 97% 97% 97% 97%   Blood pressure 107/66, pulse 120, temperature 98 F (36.7 C), temperature source Oral, resp. rate 20, SpO2 97.00%. PSYCH: She is alert and oriented x4; does not appear anxious does not appear depressed; affect is normal HEENT: Normocephalic and Atraumatic, Mucous membranes pink; PERRLA; EOM intact; Fundi:  Benign;  No scleral icterus, Nares: Patent, Oropharynx: Clear, Edentulous or Dentures present, Neck:  FROM, no cervical lymphadenopathy nor thyromegaly or carotid bruit; no JVD; Breasts:: Not examined CHEST WALL: No tenderness CHEST: Normal respiration, clear to auscultation bilaterally HEART:  Regular rate and rhythm; no murmurs rubs or gallops BACK: No kyphosis or scoliosis; no CVA tenderness ABDOMEN: Positive Bowel Sounds, soft non-tender; no masses, no organomegaly.    Rectal Exam: Not done Scaphoid, O EXTREMITIES: No cyanosis, clubbing or edema; no ulcerations. Genitalia: not examined PULSES: 2+ and symmetric SKIN: Normal hydration no rash or ulceration CNS: Cranial nerves 2-12 grossly intact, Generalized weakness, quadraparesis .       Labs on Admission:  Basic Metabolic Panel:  Recent Labs Lab 09/07/12 1634  NA 142  K 3.9  CL 97  CO2 24  GLUCOSE 157*  BUN 28*  CREATININE 0.35*  CALCIUM 10.7*   Liver Function Tests:  Recent Labs Lab 09/07/12 1634  AST 14  ALT 7  ALKPHOS 49  BILITOT 0.1*  PROT 7.8  ALBUMIN 3.9   No results found for this basename: LIPASE, AMYLASE,  in the last 168 hours No results found  for this basename: AMMONIA,  in the last 168 hours CBC:  Recent Labs Lab 09/07/12 1634  WBC 6.9  NEUTROABS 3.7  HGB 7.0*  HCT 25.4*  MCV 58.3*  PLT 439*   Cardiac Enzymes: No results found for this basename: CKTOTAL, CKMB, CKMBINDEX, TROPONINI,  in the last 168 hours  BNP (last 3 results) No results found for this basename: PROBNP,  in the last 8760 hours CBG: No results found for this basename: GLUCAP,  in the last 168 hours  Radiological Exams on Admission: Dg Chest Port 1 View  09/07/2012  *RADIOLOGY REPORT*  Clinical Data: Possible pneumonia.  Prior smoker.  Bronchitis.  PORTABLE CHEST - 1 VIEW  Comparison: 11/15/2012  Findings: Patchy opacity in the left lung base.  Right lung is clear.  Heart is normal size.  No effusions.  No acute bony abnormality.  IMPRESSION: Patchy vague left basilar airspace opacity.  This could represent developing infiltrate/pneumonia.   Original Report Authenticated By: Charlett Nose, M.D.     EKG: Independently reviewed.   Assessment/Plan Principal Problem:   GI bleed Active Problems:   Anemia   MS  (multiple sclerosis)   Tachycardia   Hypotension   1.   GI Bleed- GI evaluation by Dr. Randa Evens in AM,   Transfusing 1 unit PRBCs,  H/H checks q 8 hrs, IV protonix q 12 hours.     2.   Anemia due to #1.     3.   Multiple Sclerosis- Stable  4.   Tachycardia-  Monitor, should improve with volume replacement.     5.   Hypotension-  Same as #4.     6.   SCDs for DVT prophylaxis.     7.  Reconcile Medications, Hold NSAIDs.      Code Status:       FULL CODE Family Communication:      Daughters at Bedside Disposition Plan:    Return to [facility] on Discharge  Time spent:  60 MINUTES  Ron Parker [facility] Pager [phone]  If 7PM-7AM, please contact night-coverage [email or web address] Password [facility] 09/07/2012, 9:22 PM

## 2012-09-07 NOTE — ED Notes (Signed)
Admitting physician at the bedside.

## 2012-09-07 NOTE — ED Notes (Addendum)
EMS-pt is from [facility], blood work was completed this am and hemoglobin found to be 6.7. Pt was recently dx with PNA yesterday. Pt with no complaints. Hx of anemia colitis. Pt is nonambulatory.

## 2012-09-07 NOTE — ED Notes (Signed)
Per Dr. Lovell Sheehan, Pt upgraded to step-down.

## 2012-09-07 NOTE — ED Notes (Signed)
Per flow manager - No Step down beds available at this time.

## 2012-09-07 NOTE — ED Provider Notes (Signed)
History     CSN: [id number]  Arrival date & time 09/07/12  1617   First MD Initiated Contact with Patient 09/07/12 1631      Chief Complaint  Patient presents with  . Abnormal Lab    (Consider location/radiation/quality/duration/timing/severity/associated sxs/prior treatment) HPI Comments: Patient with history of MS with paralysis from neck down.  Resides at a nursing home.  Was sent here for an abnormal lab value.  She was told that her Hb was low.  She has no complaints.  No pain, fevers, chills.  No shortness of breath.  Also has history of DM.  The history is provided by the patient.    Past Medical History  Diagnosis Date  . Hypertension   . Diabetes mellitus   . GERD (gastroesophageal reflux disease)   . Neuromuscular disorder   . Multiple sclerosis   . Anxiety   . Hyperlipemia   . Pneumonia   . Colitis   . Unable to ambulate     Past Surgical History  Procedure Laterality Date  . Tubal ligation    . Flexible sigmoidoscopy  10/05/2011    Procedure: FLEXIBLE SIGMOIDOSCOPY;  Surgeon: Vertell Novak., MD;  Location: [facility];  Service: Endoscopy;  Laterality: N/A;   pt. coming from [facility]-[phone] daughter phone no. [phone],daughter might come with her  sedation is needed, pt. is paraplegic    History reviewed. No pertinent family history.  History  Substance Use Topics  . Smoking status: Former Smoker -- 1.50 packs/day for 30 years    Quit date: 11/16/1998  . Smokeless tobacco: Never Used  . Alcohol Use: No    OB History   Grav Para Term Preterm Abortions TAB SAB Ect Mult Living                  Review of Systems  Constitutional: Negative for fever, chills and fatigue.  Respiratory: Negative for shortness of breath.   Cardiovascular: Negative for chest pain and palpitations.  Gastrointestinal: Negative for nausea, vomiting and diarrhea.  All other systems reviewed and are negative.    Allergies  Contrast media and  Penicillins  Home Medications   Current Outpatient Rx  Name  Route  Sig  Dispense  Refill  . aspirin 81 MG tablet   Oral   Take 81 mg by mouth daily.         Marland Kitchen atorvastatin (LIPITOR) 20 MG tablet   Oral   Take 20 mg by mouth See admin instructions. Takes 1 tablet daily on mondays, Wednesdays, and fridays.         . cetirizine (ZYRTEC) 10 MG tablet   Oral   Take 10 mg by mouth daily.         . cholecalciferol (VITAMIN D) 1000 UNITS tablet   Oral   Take 2,000 Units by mouth 2 (two) times daily.         . feeding supplement (ENSURE) PUDG   Oral   Take 1 Container by mouth daily before supper.         . furosemide (LASIX) 40 MG tablet   Oral   Take 40 mg by mouth daily.         Marland Kitchen gabapentin (NEURONTIN) 100 MG capsule   Oral   Take 100 mg by mouth 3 (three) times daily.         Marland Kitchen glyBURIDE (DIABETA) 2.5 MG tablet   Oral   Take 2.5 mg by mouth 2 (two) times daily with  a meal.          . guaiFENesin (MUCINEX) 600 MG 12 hr tablet   Oral   Take 2 tablets (1,200 mg total) by mouth 2 (two) times daily.         Marland Kitchen ibuprofen (ADVIL,MOTRIN) 800 MG tablet   Oral   Take 800 mg by mouth every 8 (eight) hours as needed. pain         . EXPIRED: ipratropium-albuterol (DUONEB) 0.5-2.5 (3) MG/3ML SOLN   Nebulization   Take 3 mLs by nebulization every 4 (four) hours as needed.         . Mesalamine (DELZICOL) 400 MG CPDR   Oral   Take 800 mg by mouth 3 (three) times daily.         . metFORMIN (GLUCOPHAGE-XR) 750 MG 24 hr tablet   Oral   Take 750 mg by mouth 2 (two) times daily.          . metoprolol tartrate (LOPRESSOR) 25 MG tablet   Oral   Take 1 tablet (25 mg total) by mouth 2 (two) times daily.         Marland Kitchen moxifloxacin (AVELOX) 400 MG tablet      Take 1 tab daily for 3 more days. Last dose 5/19.13         . pantoprazole (PROTONIX) 40 MG tablet   Oral   Take 40 mg by mouth daily.           BP 80/42  Pulse 116  Temp(Src) 97.6 F (36.4  C) (Oral)  Resp 23  SpO2 94%  Physical Exam  Nursing note and vitals reviewed. Constitutional: She is oriented to person, place, and time. No distress.  Patient is pale.  HENT:  Head: Normocephalic and atraumatic.  MM's dry  Neck: Normal range of motion. Neck supple.  Cardiovascular: Normal rate and regular rhythm.   No murmur heard. Pulmonary/Chest: Effort normal and breath sounds normal. No respiratory distress. She has no wheezes.  Abdominal: Soft. Bowel sounds are normal. She exhibits no distension. There is no tenderness.  Musculoskeletal:  There is atrophy of arms and legs.    Lymphadenopathy:    She has no cervical adenopathy.  Neurological: She is alert and oriented to person, place, and time.  Skin: Skin is warm and dry. She is not diaphoretic.    ED Course  Procedures (including critical care time)  Labs Reviewed  CBC WITH DIFFERENTIAL  COMPREHENSIVE METABOLIC PANEL  TYPE AND SCREEN   No results found.   No diagnosis found.    MDM  Patient with history of MS with quadriplegia sent for eval of low Hb.  Stools here are heme positive.  I have spoken with Dr. Randa Evens from GI who wants patient admitted to [facility].  Dr. Lovell Sheehan agrees to admit.        Geoffery Lyons, MD 09/07/12 1946

## 2012-09-07 NOTE — ED Notes (Signed)
Pt had a BM. Cleaned up and changed into new brief.

## 2012-09-08 DIAGNOSIS — E1110 Type 2 diabetes mellitus with ketoacidosis without coma: Secondary | ICD-10-CM | POA: Diagnosis present

## 2012-09-08 DIAGNOSIS — D649 Anemia, unspecified: Secondary | ICD-10-CM

## 2012-09-08 DIAGNOSIS — E1310 Other specified diabetes mellitus with ketoacidosis without coma: Secondary | ICD-10-CM

## 2012-09-08 DIAGNOSIS — J189 Pneumonia, unspecified organism: Secondary | ICD-10-CM | POA: Diagnosis present

## 2012-09-08 LAB — CBC
Hemoglobin: 8.2 g/dL — ABNORMAL LOW (ref 12.0–15.0)
MCH: 18.3 pg — ABNORMAL LOW (ref 26.0–34.0)
Platelets: 419 10*3/uL — ABNORMAL HIGH (ref 150–400)
RBC: 4.49 MIL/uL (ref 3.87–5.11)
WBC: 8.5 10*3/uL (ref 4.0–10.5)

## 2012-09-08 LAB — RETICULOCYTES
Retic Count, Absolute: 52 10*3/uL (ref 19.0–186.0)
Retic Ct Pct: 1.1 % (ref 0.4–3.1)

## 2012-09-08 LAB — GLUCOSE, CAPILLARY: Glucose-Capillary: 201 mg/dL — ABNORMAL HIGH (ref 70–99)

## 2012-09-08 LAB — PREPARE RBC (CROSSMATCH)

## 2012-09-08 LAB — BASIC METABOLIC PANEL
Calcium: 9.6 mg/dL (ref 8.4–10.5)
GFR calc Af Amer: >90 mL/min (ref >90)
GFR calc non Af Amer: >90 mL/min (ref >90)
Potassium: 3.4 mEq/L — ABNORMAL LOW (ref 3.5–5.1)
Sodium: 142 mEq/L (ref 135–145)

## 2012-09-08 LAB — IRON AND TIBC
Iron: 60 ug/dL (ref 42–135)
Saturation Ratios: 16 % — ABNORMAL LOW (ref 20–55)
UIBC: 309 ug/dL (ref 125–400)

## 2012-09-08 LAB — MRSA PCR SCREENING: MRSA by PCR: NEGATIVE

## 2012-09-08 LAB — TROPONIN I
Troponin I: <0.3 ng/mL (ref <0.30)
Troponin I: <0.3 ng/mL (ref <0.30)

## 2012-09-08 LAB — FOLATE: Folate: 9.6 ng/mL

## 2012-09-08 LAB — FERRITIN: Ferritin: 9 ng/mL — ABNORMAL LOW (ref 10–291)

## 2012-09-08 MED ORDER — LEVOFLOXACIN IN D5W 750 MG/150ML IV SOLN
750.0000 mg | INTRAVENOUS | Status: DC
  Administered 2012-09-08 – 2012-09-11 (×4): 750 mg via INTRAVENOUS
  Filled 2012-09-08 (×5): qty 150

## 2012-09-08 MED ORDER — DEXTROSE 5 % IV SOLN
1.0000 g | Freq: Two times a day (BID) | INTRAVENOUS | Status: AC
  Administered 2012-09-08 – 2012-09-10 (×6): 1 g via INTRAVENOUS
  Filled 2012-09-08 (×7): qty 1

## 2012-09-08 MED ORDER — INSULIN ASPART 100 UNIT/ML ~~LOC~~ SOLN
0.0000 [IU] | SUBCUTANEOUS | Status: DC
  Administered 2012-09-08: 1 [IU] via SUBCUTANEOUS
  Administered 2012-09-08 – 2012-09-09 (×2): 3 [IU] via SUBCUTANEOUS
  Administered 2012-09-09 – 2012-09-10 (×4): 2 [IU] via SUBCUTANEOUS
  Administered 2012-09-11: 1 [IU] via SUBCUTANEOUS
  Administered 2012-09-11: 2 [IU] via SUBCUTANEOUS

## 2012-09-08 MED ORDER — WHITE PETROLATUM GEL
Status: AC
  Administered 2012-09-08: 04:00:00
  Filled 2012-09-08: qty 5

## 2012-09-08 MED ORDER — VANCOMYCIN HCL 1000 MG IV SOLR
750.0000 mg | INTRAVENOUS | Status: DC
  Administered 2012-09-08 – 2012-09-09 (×2): 750 mg via INTRAVENOUS
  Filled 2012-09-08 (×2): qty 750

## 2012-09-08 MED ORDER — METOPROLOL TARTRATE 1 MG/ML IV SOLN: 2.5000 mg | INTRAVENOUS | Status: DC | PRN

## 2012-09-08 MED ORDER — GABAPENTIN 100 MG PO CAPS
100.0000 mg | ORAL_CAPSULE | Freq: Three times a day (TID) | ORAL | Status: DC
  Administered 2012-09-08 – 2012-09-11 (×9): 100 mg via ORAL
  Filled 2012-09-08 (×11): qty 1

## 2012-09-08 MED ORDER — POTASSIUM CHLORIDE CRYS ER 20 MEQ PO TBCR
40.0000 meq | EXTENDED_RELEASE_TABLET | Freq: Once | ORAL | Status: AC
  Administered 2012-09-08: 40 meq via ORAL
  Filled 2012-09-08: qty 2

## 2012-09-08 MED ORDER — PEG 3350-KCL-NA BICARB-NACL 420 G PO SOLR
4000.0000 mL | Freq: Once | ORAL | Status: AC
  Administered 2012-09-08: 4000 mL via ORAL
  Filled 2012-09-08: qty 4000

## 2012-09-08 MED ORDER — SODIUM CHLORIDE 0.9 % IV BOLUS (SEPSIS)
500.0000 mL | Freq: Once | INTRAVENOUS | Status: AC
  Administered 2012-09-08: 500 mL via INTRAVENOUS

## 2012-09-08 NOTE — Progress Notes (Signed)
eLink Physician-Brief Progress Note Patient Name: [name] DOB: 04-22-1935 MRN: [id number]  Date of Service  09/08/2012   HPI/Events of Note  Hypokalemia   eICU Interventions  Potassium replaced   Intervention Category Minor Interventions: Electrolytes abnormality - evaluation and management  DETERDING,ELIZABETH 09/08/2012, 6:59 AM

## 2012-09-08 NOTE — Progress Notes (Signed)
The patient reporting having problems swallowing   Recommend single sips;   Swallow evaluation in am.

## 2012-09-08 NOTE — Progress Notes (Signed)
78 yo with quadriplegia, MS and chronic diarrhea from ulcerative colitis; presented with anemia/GI bleed   Now hypotensive drinking Golytely for GI prep for colonoscopy; having profoose diarrhea   Bolus 500cc.

## 2012-09-08 NOTE — Progress Notes (Signed)
eLink Physician-Brief Progress Note Patient Name: [name] DOB: 22-May-1935 MRN: [id number]  Date of Service  09/08/2012   HPI/Events of Note  Call from bedside nurse reporting sinus tach with HR to the 140s with BP of 133/56 in patient with MS and DM admitted with anemia.  Had received blood txf earlier.  Is also oliguric and is receiving IVFs at 75 cc/hr.   eICU Interventions  Plan: Order for lopressor prn HR control Will cycle cardiac enzymes given diagnosis of DM and concern for unexplained tachycardia - r/o silent cardiac issues.   Intervention Category Intermediate Interventions: Arrhythmia - evaluation and management  DETERDING,ELIZABETH 09/08/2012, 3:11 AM

## 2012-09-08 NOTE — Progress Notes (Signed)
ANTIBIOTIC CONSULT NOTE - INITIAL  Pharmacy Consult for Vancomycin/Levaquin/Cefepime Indication: HCAP  Allergies  Allergen Reactions  . Contrast Media (Iodinated Diagnostic Agents)     Unknown  . Penicillins     Unknown    Patient Measurements: Height: 5' 4.17" (163 cm) Weight: 115 lb 8.3 oz (52.4 kg) IBW/kg (Calculated) : 55.1  Vital Signs: Temp: 98.1 F (36.7 C) (03/01 0754) Temp src: Oral (03/01 0754) BP: 134/55 mmHg (03/01 0700) Pulse Rate: 120 (03/01 0700) Intake/Output from previous day: 02/28 0701 - 03/01 0700 In: 2374.2 [I.V.:2024.2; Blood:350] Out: 140 [Urine:140] Intake/Output from this shift:    Labs:  Recent Labs  09/07/12 1634 09/08/12 0430  WBC 6.9 8.5  HGB 7.0* 8.2*  PLT 439* 419*  CREATININE 0.35* 0.38*   Estimated Creatinine Clearance: 50.3 ml/min (by C-G formula based on Cr of 0.38). No results found for this basename: VANCOTROUGH, Leodis Binet, VANCORANDOM, GENTTROUGH, GENTPEAK, GENTRANDOM, TOBRATROUGH, TOBRAPEAK, TOBRARND, AMIKACINPEAK, AMIKACINTROU, AMIKACIN,  in the last 72 hours   Microbiology: Recent Results (from the past 720 hour(s))  MRSA PCR SCREENING     Status: None   Collection Time    09/07/12 10:14 PM      Result Value Range Status   MRSA by PCR NEGATIVE  NEGATIVE Final   Comment:            The GeneXpert MRSA Assay (FDA     approved for NASAL specimens     only), is one component of a     comprehensive MRSA colonization     surveillance program. It is not     intended to diagnose MRSA     infection nor to guide or     monitor treatment for     MRSA infections.    Medical History: Past Medical History  Diagnosis Date  . Hypertension   . Diabetes mellitus   . GERD (gastroesophageal reflux disease)   . Neuromuscular disorder   . Multiple sclerosis   . Anxiety   . Hyperlipemia   . Pneumonia   . Colitis   . Unable to ambulate     Medications:  Scheduled:  . [COMPLETED] sodium chloride   Intravenous STAT  .  insulin aspart  0-9 Units Subcutaneous Q4H  . pantoprazole (PROTONIX) IV  40 mg Intravenous Q12H  . potassium chloride  40 mEq Oral Once  . [COMPLETED] white petrolatum       Infusions:  . sodium chloride 75 mL/hr at 09/07/12 2200   Assessment: 78 y/o female patient admitted with anemia, found to have GIB and pneumonia, requiring broad spectrum antibiotics. Patient has h/o pcn allergy but has tolerated cefepime in the past.   Goal of Therapy:  Vancomycin trough level 15-20 mcg/ml  Plan:  Vancomycin 750mg  IV q24h, levaquin 750mg  IV q24, cefepime 1g IV q12h and monitor renal function. Measure antibiotic drug levels at steady state Follow up culture results  Verlene Mayer, PharmD, BCPS Pager [phone] 09/08/2012,8:41 AM

## 2012-09-08 NOTE — Consult Note (Signed)
[facility] CONSULT Reason for consult: anemia with hemoglobin of 7 Referring Physician: [facility]. Primary G.I.: Dr. Randa Evens. PCP: Dr. Nadara Mode [name] is an 78 y.o. female.  HPI: 78 year old female who unfortunately has had progressive MS. This is finally resulted in near quadriplegia and placement in nursing home. She been feeling poorly was brought to the emergency room and found to have a hemoglobin of 7. She's had previous history pneumonia without clear cough, just feeling badly and this is what brought her to the emergency room. I have been following this woman is an outpatient for a number of years dating back to the early 56s. She is head chronic diarrhea has had a multitude of sigmoidoscopy's colonoscopies. Biopsies have shown chronic colitis consistent with chronic ulcerative colitis. This is been easily controlled with 5 ASA preparations. Colonoscopy in 2006, she also had small adenomatous polyps removed. 2013 increasing bleeding resulted in sigmoidoscopy. At that time she and her family did not feel she could go through a full colonoscopy prep. Sigmoidoscopy to the descending: showed mild chronic colitis and she was restarted on 5 ASA preparation. She denies chronic diarrhea or bleeding at this time. She reports that her MS is progressed to the point that she is unable to really do anything other than move her head and swallow. She states that she is able to eat solid foods and liquids without difficulty.  Past Medical History  Diagnosis Date  . Hypertension   . Diabetes mellitus   . GERD (gastroesophageal reflux disease)   . Neuromuscular disorder   . Multiple sclerosis   . Anxiety   . Hyperlipemia   . Pneumonia   . Colitis   . Unable to ambulate     Past Surgical History  Procedure Laterality Date  . Tubal ligation    . Flexible sigmoidoscopy  10/05/2011    Procedure: FLEXIBLE SIGMOIDOSCOPY;  Surgeon: Vertell Novak., MD;  Location: [facility];   Service: Endoscopy;  Laterality: N/A;   pt. coming from [facility]-[phone] daughter phone no. [phone],daughter might come with her  sedation is needed, pt. is paraplegic    Family History  Problem Relation Age of Onset  . CAD Mother   . Diabetes Daughter   . Diabetes Daughter   . Diabetes Son     Social History:  reports that she quit smoking about 13 years ago. She has never used smokeless tobacco. She reports that she does not drink alcohol or use illicit drugs.  Allergies:  Allergies  Allergen Reactions  . Contrast Media (Iodinated Diagnostic Agents)     Unknown  . Penicillins     Unknown    Medications; . ceFEPime (MAXIPIME) IV  1 g Intravenous Q12H  . insulin aspart  0-9 Units Subcutaneous Q4H  . levofloxacin (LEVAQUIN) IV  750 mg Intravenous Q24H  . pantoprazole (PROTONIX) IV  40 mg Intravenous Q12H  . vancomycin  750 mg Intravenous Q24H   PRN Meds acetaminophen, acetaminophen, alum & mag hydroxide-simeth, HYDROmorphone (DILAUDID) injection, metoprolol, ondansetron (ZOFRAN) IV, ondansetron, oxyCODONE, zolpidem Results for orders placed during the hospital encounter of 09/07/12 (from the past 48 hour(s))  CBC WITH DIFFERENTIAL     Status: Abnormal   Collection Time    09/07/12  4:34 PM      Result Value Range   WBC 6.9  4.0 - 10.5 K/uL   RBC 4.36  3.87 - 5.11 MIL/uL   Hemoglobin 7.0 (*) 12.0 - 15.0 g/dL   HCT 09.8 (*)  36.0 - 46.0 %   MCV 58.3 (*) 78.0 - 100.0 fL   MCH 16.1 (*) 26.0 - 34.0 pg   MCHC 27.6 (*) 30.0 - 36.0 g/dL   RDW 16.1 (*) 09.6 - 04.5 %   Platelets 439 (*) 150 - 400 K/uL   Neutrophils Relative 54  43 - 77 %   Lymphocytes Relative 35  12 - 46 %   Monocytes Relative 10  3 - 12 %   Eosinophils Relative 1  0 - 5 %   Basophils Relative 0  0 - 1 %   Neutro Abs 3.7  1.7 - 7.7 K/uL   Lymphs Abs 2.4  0.7 - 4.0 K/uL   Monocytes Absolute 0.7  0.1 - 1.0 K/uL   Eosinophils Absolute 0.1  0.0 - 0.7 K/uL   Basophils Absolute 0.0  0.0 - 0.1  K/uL   RBC Morphology POLYCHROMASIA PRESENT     Comment: ELLIPTOCYTES     FEW PLATELET CLUMPS NOTED  COMPREHENSIVE METABOLIC PANEL     Status: Abnormal   Collection Time    09/07/12  4:34 PM      Result Value Range   Sodium 142  135 - 145 mEq/L   Potassium 3.9  3.5 - 5.1 mEq/L   Chloride 97  96 - 112 mEq/L   CO2 24  19 - 32 mEq/L   Glucose, Bld 157 (*) 70 - 99 mg/dL   BUN 28 (*) 6 - 23 mg/dL   Creatinine, Ser 4.09 (*) 0.50 - 1.10 mg/dL   Calcium 81.1 (*) 8.4 - 10.5 mg/dL   Total Protein 7.8  6.0 - 8.3 g/dL   Albumin 3.9  3.5 - 5.2 g/dL   AST 14  0 - 37 U/L   ALT 7  0 - 35 U/L   Alkaline Phosphatase 49  39 - 117 U/L   Total Bilirubin 0.1 (*) 0.3 - 1.2 mg/dL   GFR calc non Af Amer >90  >90 mL/min   GFR calc Af Amer >90  >90 mL/min   Comment:            The eGFR has been calculated     using the CKD EPI equation.     This calculation has not been     validated in all clinical     situations.     eGFR's persistently     <90 mL/min signify     possible Chronic Kidney Disease.  TYPE AND SCREEN     Status: None   Collection Time    09/07/12  4:45 PM      Result Value Range   ABO/RH(D) O POS     Antibody Screen NEG     Sample Expiration 09/10/2012     Unit Number [id number]     Blood Component Type RED CELLS,LR     Unit division 00     Status of Unit ISSUED     Transfusion Status OK TO TRANSFUSE     Crossmatch Result Compatible     Unit Number [id number]     Blood Component Type RED CELLS,LR     Unit division 00     Status of Unit ISSUED     Transfusion Status OK TO TRANSFUSE     Crossmatch Result Compatible    ABO/RH     Status: None   Collection Time    09/07/12  4:45 PM      Result Value Range   ABO/RH(D) O POS  OCCULT BLOOD, POC DEVICE     Status: Abnormal   Collection Time    09/07/12  6:26 PM      Result Value Range   Fecal Occult Bld POSITIVE (*) NEGATIVE  PREPARE RBC (CROSSMATCH)     Status: None   Collection Time    09/07/12  8:00 PM       Result Value Range   Order Confirmation ORDER PROCESSED BY BLOOD BANK    MRSA PCR SCREENING     Status: None   Collection Time    09/07/12 10:14 PM      Result Value Range   MRSA by PCR NEGATIVE  NEGATIVE   Comment:            The GeneXpert MRSA Assay (FDA     approved for NASAL specimens     only), is one component of a     comprehensive MRSA colonization     surveillance program. It is not     intended to diagnose MRSA     infection nor to guide or     monitor treatment for     MRSA infections.  URINALYSIS, ROUTINE W REFLEX MICROSCOPIC     Status: Abnormal   Collection Time    09/07/12 10:40 PM      Result Value Range   Color, Urine YELLOW  YELLOW   APPearance CLOUDY (*) CLEAR   Specific Gravity, Urine 1.015  1.005 - 1.030   pH 5.0  5.0 - 8.0   Glucose, UA NEGATIVE  NEGATIVE mg/dL   Hgb urine dipstick SMALL (*) NEGATIVE   Bilirubin Urine NEGATIVE  NEGATIVE   Ketones, ur NEGATIVE  NEGATIVE mg/dL   Protein, ur NEGATIVE  NEGATIVE mg/dL   Urobilinogen, UA 0.2  0.0 - 1.0 mg/dL   Nitrite NEGATIVE  NEGATIVE   Leukocytes, UA LARGE (*) NEGATIVE  URINE MICROSCOPIC-ADD ON     Status: Abnormal   Collection Time    09/07/12 10:40 PM      Result Value Range   Squamous Epithelial / LPF RARE  RARE   WBC, UA 11-20  <3 WBC/hpf   RBC / HPF 3-6  <3 RBC/hpf   Bacteria, UA RARE  RARE   Casts HYALINE CASTS (*) NEGATIVE   Urine-Other RARE YEAST    BASIC METABOLIC PANEL     Status: Abnormal   Collection Time    09/08/12  4:30 AM      Result Value Range   Sodium 142  135 - 145 mEq/L   Potassium 3.4 (*) 3.5 - 5.1 mEq/L   Chloride 99  96 - 112 mEq/L   CO2 29  19 - 32 mEq/L   Glucose, Bld 117 (*) 70 - 99 mg/dL   BUN 26 (*) 6 - 23 mg/dL   Creatinine, Ser 4.09 (*) 0.50 - 1.10 mg/dL   Calcium 9.6  8.4 - 81.1 mg/dL   GFR calc non Af Amer >90  >90 mL/min   GFR calc Af Amer >90  >90 mL/min   Comment:            The eGFR has been calculated     using the CKD EPI equation.     This  calculation has not been     validated in all clinical     situations.     eGFR's persistently     <90 mL/min signify     possible Chronic Kidney Disease.  CBC     Status: Abnormal  Collection Time    09/08/12  4:30 AM      Result Value Range   WBC 8.5  4.0 - 10.5 K/uL   RBC 4.49  3.87 - 5.11 MIL/uL   Hemoglobin 8.2 (*) 12.0 - 15.0 g/dL   HCT 16.1 (*) 09.6 - 04.5 %   MCV 60.4 (*) 78.0 - 100.0 fL   MCH 18.3 (*) 26.0 - 34.0 pg   MCHC 30.3  30.0 - 36.0 g/dL   RDW 40.9 (*) 81.1 - 91.4 %   Platelets 419 (*) 150 - 400 K/uL  TROPONIN I     Status: None   Collection Time    09/08/12  4:30 AM      Result Value Range   Troponin I <0.30  <0.30 ng/mL   Comment:            Due to the release kinetics of cTnI,     a negative result within the first hours     of the onset of symptoms does not rule out     myocardial infarction with certainty.     If myocardial infarction is still suspected,     repeat the test at appropriate intervals.  PREPARE RBC (CROSSMATCH)     Status: None   Collection Time    09/08/12  7:33 AM      Result Value Range   Order Confirmation ORDER PROCESSED BY BLOOD BANK    GLUCOSE, CAPILLARY     Status: Abnormal   Collection Time    09/08/12  9:25 AM      Result Value Range   Glucose-Capillary 116 (*) 70 - 99 mg/dL  GLUCOSE, CAPILLARY     Status: Abnormal   Collection Time    09/08/12 11:20 AM      Result Value Range   Glucose-Capillary 121 (*) 70 - 99 mg/dL    Dg Chest Port 1 View  09/07/2012  *RADIOLOGY REPORT*  Clinical Data: Possible pneumonia.  Prior smoker.  Bronchitis.  PORTABLE CHEST - 1 VIEW  Comparison: 11/15/2012  Findings: Patchy opacity in the left lung base.  Right lung is clear.  Heart is normal size.  No effusions.  No acute bony abnormality.  IMPRESSION: Patchy vague left basilar airspace opacity.  This could represent developing infiltrate/pneumonia.   Original Report Authenticated By: Charlett Nose, M.D.               Blood pressure  119/49, pulse 102, temperature 97.5 F (36.4 C), temperature source Oral, resp. rate 22, height 5' 4.17" (1.63 m), weight 52.4 kg (115 lb 8.3 oz), SpO2 99.00%.  Physical exam:   Gen. -- frail appearing white female. Alert and oriented. Able to move head and neck without difficulty. Minimal if any movement of upper extremities Lungs -- clear CV -- regular rate and rhythm without murmurs are gallops ABD -- soft and nontender none distend  Assessment: 1. Hemoccult positive stools/anemia. Could be due to many things and I think would be reasonable while the hospital to go ahead and evaluate both the upper and lower G.I. Tracks. 2. History of mild ulcerative colitis. 3. History of small colon polyps. Surveillance colonoscopy was not performed after discussion with patient and family due to her severe debilitated state from MS. 4. Severe MS with quadriplegic   Plan: 1. We'll go ahead and schedule for EGD and colonoscopy/sigmoidoscopy while here in the hospital. I have discussed this with the patient and her daughter. If we are unable to complete colonoscopy,  hopefully we can at least evaluate the left side of the colon. This will be scheduled for tomorrow morning.   Ikesha Siller JR,Aris Even L 09/08/2012, 11:55 AM

## 2012-09-08 NOTE — Progress Notes (Signed)
[facility] PROGRESS NOTE  [name] ZOX:[id number] DOB: 1934-11-08 DOA: 09/07/2012 PCP: Theodosia Paling, MD  Assessment/Plan: Principal Problem:   GI bleed Active Problems:   MS (multiple sclerosis)   Tachycardia   Anemia   Hypotension    1. GI Bleed/Anemiia: Patient was found to have HB 7.0, and positive FOBT, against a known baseline HB of 10.3 in 11/2011. She has no documented hematemesis, melena or hematochezia, and MCV was 58.3. This appears to be a chronic iron deficiency anemia, from occult GI blood loss. Transfusing 2 units PRBC. Will do anemia panel. Dr. Randa Evens, gastroenterologist, has been consulted. On iv Protonix. 2. HCAP: Patient's CXR revealed patchy vague left basilar airspace opacity, suggestive of developing infiltrate/pneumonia. She does have a history of recurrent pneumonia, and is currently tachycardic, although afebrile and with normal wcc. Will commence broad-spectrum antibiotic treatment, for HCAP. IV Vancomycin/Cefepime/Levaquin, started today.  3. Tachycardia/Hypotension: Patient presented with a sinus tachycardia, and HR 117-120, as well as a transient hypotension, with SBP of 97. This is likely multifactorial, secondary to profound anemia, volume depletion and possible early sepsis. Managing as above. BP has normalized, but tachycardia is persistent. First set of cardiac enzymes is normal. E-link (Dr Deterding). Has commenced prn iv Lopressor. Diuretics are on hold, and patient is on iv fluids, as well as prn blood transfusion.  4. Multiple Sclerosis: Stable  5. Diabetes Mellitus: Patient has type 2 DM, and was on Metformin, pre-admission. This appears controlled, based on random blood glucose of 117. Metformin is on hold, and we shall manage with SSI.Check HBA1C.     Code Status: Full Code.  Family Communication:  Disposition Plan: To be determined.    Brief narrative: 78 y.o. female resident of the [facility], with known history of HTN,  dyslipidemia, DM-2, GERD, multiple sclerosis, anxiety, who was sent to the ED after labs returned with a low hemoglobin level of 7.0. She reports not feeling well for the past week and at the facility labs were drawn and a chest x-ray was done because she has had pneumonia during this time of the year several times before in the past. She denies having any nausea or vomiting or hematemesis or diarrhea, but in the ED she was found to have a heme positive FOBT on rectal examination. She also denied having chest pain SOB, and lightheadedness. Dr Randa Evens, her gastroenterologist was contacted and she was referred for admission. EDP ordered 1 unit of blood for for transfusion.    Consultants:  Dr Randa Evens, gastroenterologist.   Procedures:  CXR.   Antibiotics:  Vancomycin 09/08/12>>>  Cefepime 09/08/12>>>  Levaquin 09/08/12>>>  HPI/Subjective: No new issues.   Objective: Vital signs in last 24 hours: Temp:  [97.6 F (36.4 C)-98.4 F (36.9 C)] 97.9 F (36.6 C) (03/01 0423) Pulse Rate:  [108-137] 120 (03/01 0700) Resp:  [18-26] 20 (03/01 0700) BP: (80-134)/(42-73) 134/55 mmHg (03/01 0700) SpO2:  [88 %-100 %] 100 % (03/01 0700) Weight change:  Last BM Date: 09/07/12  Intake/Output from previous day: 02/28 0701 - 03/01 0700 In: 2374.2 [I.V.:2024.2; Blood:350] Out: 140 [Urine:140]     Physical Exam: General: Comfortable, alert, communicative, fully oriented, not short of breath at rest.  HEENT:  Marked clinical pallor, no jaundice, no conjunctival injection or discharge. NECK:  Supple, JVP not seen, no carotid bruits, no palpable lymphadenopathy, no palpable goiter. CHEST:  Clinically clear to auscultation, no wheezes, no crackles. HEART:  Sounds 1 and 2 heard, normal, regular, tachycardic, no murmurs. ABDOMEN:  Flat, soft, non-tender, no palpable organomegaly, no palpable masses, normal bowel sounds. GENITALIA:  Not examined. LOWER EXTREMITIES:  No pitting edema, palpable peripheral  pulses. MUSCULOSKELETAL SYSTEM:  Generalized osteoarthritic changes, otherwise, normal. CENTRAL NERVOUS SYSTEM:  Not formally examined.   Lab Results:  Recent Labs  09/07/12 1634 09/08/12 0430  WBC 6.9 8.5  HGB 7.0* 8.2*  HCT 25.4* 27.1*  PLT 439* 419*    Recent Labs  09/07/12 1634 09/08/12 0430  NA 142 142  K 3.9 3.4*  CL 97 99  CO2 24 29  GLUCOSE 157* 117*  BUN 28* 26*  CREATININE 0.35* 0.38*  CALCIUM 10.7* 9.6   Recent Results (from the past 240 hour(s))  MRSA PCR SCREENING     Status: None   Collection Time    09/07/12 10:14 PM      Result Value Range Status   MRSA by PCR NEGATIVE  NEGATIVE Final   Comment:            The GeneXpert MRSA Assay (FDA     approved for NASAL specimens     only), is one component of a     comprehensive MRSA colonization     surveillance program. It is not     intended to diagnose MRSA     infection nor to guide or     monitor treatment for     MRSA infections.     Studies/Results: Dg Chest Port 1 View  09/07/2012  *RADIOLOGY REPORT*  Clinical Data: Possible pneumonia.  Prior smoker.  Bronchitis.  PORTABLE CHEST - 1 VIEW  Comparison: 11/15/2012  Findings: Patchy opacity in the left lung base.  Right lung is clear.  Heart is normal size.  No effusions.  No acute bony abnormality.  IMPRESSION: Patchy vague left basilar airspace opacity.  This could represent developing infiltrate/pneumonia.   Original Report Authenticated By: Charlett Nose, M.D.     Medications: Scheduled Meds: . sodium chloride   Intravenous STAT  . pantoprazole (PROTONIX) IV  40 mg Intravenous Q12H  . potassium chloride  40 mEq Oral Once   Continuous Infusions: . sodium chloride 75 mL/hr at 09/07/12 2200   PRN Meds:.acetaminophen, acetaminophen, alum & mag hydroxide-simeth, HYDROmorphone (DILAUDID) injection, metoprolol, ondansetron (ZOFRAN) IV, ondansetron, oxyCODONE, zolpidem    LOS: 1 day   Ruthy Forry,CHRISTOPHER  [facility] Pager [phone]. If  8PM-8AM, please contact night-coverage at [email or web address], password [email or web address] 09/08/2012, 7:11 AM  LOS: 1 day

## 2012-09-08 NOTE — Progress Notes (Signed)
Black box and blackbox RN informed of patient's tachycardia <140. Pt has been 110-125 since arrival.

## 2012-09-09 ENCOUNTER — Encounter (HOSPITAL_COMMUNITY): Payer: Self-pay

## 2012-09-09 ENCOUNTER — Encounter (HOSPITAL_COMMUNITY)
Admission: EM | Disposition: A | Discharge status: Skilled Nursing Facility | Payer: Self-pay | Source: Home / Self Care | Attending: Internal Medicine

## 2012-09-09 HISTORY — PX: COLONOSCOPY: SHX5424

## 2012-09-09 HISTORY — PX: ESOPHAGOGASTRODUODENOSCOPY: SHX5428

## 2012-09-09 LAB — BASIC METABOLIC PANEL
BUN: 8 mg/dL (ref 6–23)
CO2: 26 mEq/L (ref 19–32)
GFR calc non Af Amer: >90 mL/min (ref >90)
Glucose, Bld: 108 mg/dL — ABNORMAL HIGH (ref 70–99)
Potassium: 3.4 mEq/L — ABNORMAL LOW (ref 3.5–5.1)
Sodium: 142 mEq/L (ref 135–145)

## 2012-09-09 LAB — COMPREHENSIVE METABOLIC PANEL
ALT: 6 U/L (ref 0–35)
Alkaline Phosphatase: 43 U/L (ref 39–117)
CO2: 22 mEq/L (ref 19–32)
GFR calc Af Amer: >90 mL/min (ref >90)
GFR calc non Af Amer: >90 mL/min (ref >90)
Glucose, Bld: 254 mg/dL — ABNORMAL HIGH (ref 70–99)
Potassium: 3.8 mEq/L (ref 3.5–5.1)
Sodium: 138 mEq/L (ref 135–145)

## 2012-09-09 LAB — GLUCOSE, CAPILLARY
Glucose-Capillary: 106 mg/dL — ABNORMAL HIGH (ref 70–99)
Glucose-Capillary: 110 mg/dL — ABNORMAL HIGH (ref 70–99)

## 2012-09-09 LAB — CBC
Hemoglobin: 8.4 g/dL — ABNORMAL LOW (ref 12.0–15.0)
RBC: 4.3 MIL/uL (ref 3.87–5.11)

## 2012-09-09 LAB — HEMOGLOBIN AND HEMATOCRIT, BLOOD: Hemoglobin: 8.6 g/dL — ABNORMAL LOW (ref 12.0–15.0)

## 2012-09-09 LAB — URINE CULTURE: Colony Count: 95000

## 2012-09-09 LAB — MAGNESIUM: Magnesium: 1.1 mg/dL — ABNORMAL LOW (ref 1.5–2.5)

## 2012-09-09 SURGERY — EGD (ESOPHAGOGASTRODUODENOSCOPY)
Anesthesia: Moderate Sedation

## 2012-09-09 MED ORDER — SODIUM CHLORIDE 0.9 % IV BOLUS (SEPSIS)
500.0000 mL | Freq: Once | INTRAVENOUS | Status: AC
  Administered 2012-09-09: 500 mL via INTRAVENOUS

## 2012-09-09 MED ORDER — SODIUM CHLORIDE 0.9 % IV SOLN
INTRAVENOUS | Status: DC
  Administered 2012-09-09: via INTRAVENOUS
  Administered 2012-09-09 (×2): 1000 mL via INTRAVENOUS
  Administered 2012-09-10: 17:00:00 via INTRAVENOUS

## 2012-09-09 MED ORDER — MAGNESIUM SULFATE 40 MG/ML IJ SOLN
2.0000 g | Freq: Once | INTRAMUSCULAR | Status: AC
  Administered 2012-09-09: 2 g via INTRAVENOUS
  Filled 2012-09-09: qty 50

## 2012-09-09 MED ORDER — POTASSIUM CHLORIDE 10 MEQ/100ML IV SOLN
10.0000 meq | INTRAVENOUS | Status: AC
  Administered 2012-09-09 (×3): 10 meq via INTRAVENOUS
  Filled 2012-09-09: qty 400

## 2012-09-09 MED ORDER — FENTANYL CITRATE 0.05 MG/ML IJ SOLN
INTRAMUSCULAR | Status: DC | PRN
  Administered 2012-09-09: 25 ug via INTRAVENOUS

## 2012-09-09 MED ORDER — MIDAZOLAM HCL 10 MG/2ML IJ SOLN
INTRAMUSCULAR | Status: DC | PRN
  Administered 2012-09-09 (×4): 1 mg via INTRAVENOUS

## 2012-09-09 MED ORDER — SODIUM CHLORIDE 0.9 % IV SOLN: INTRAVENOUS | Status: DC

## 2012-09-09 MED ORDER — FENTANYL CITRATE 0.05 MG/ML IJ SOLN
INTRAMUSCULAR | Status: AC
  Filled 2012-09-09: qty 2

## 2012-09-09 MED ORDER — MIDAZOLAM HCL 5 MG/ML IJ SOLN
INTRAMUSCULAR | Status: AC
  Filled 2012-09-09: qty 2

## 2012-09-09 MED ORDER — POLYETHYLENE GLYCOL 3350 17 G PO PACK
17.0000 g | PACK | Freq: Once | ORAL | Status: DC
  Filled 2012-09-09: qty 1

## 2012-09-09 MED ORDER — DEXTROSE 50 % IV SOLN
INTRAVENOUS | Status: AC
  Administered 2012-09-09: 25 mL
  Filled 2012-09-09: qty 50

## 2012-09-09 MED ORDER — BUTAMBEN-TETRACAINE-BENZOCAINE 2-2-14 % EX AERO
INHALATION_SPRAY | CUTANEOUS | Status: DC | PRN
  Administered 2012-09-09: 2 via TOPICAL

## 2012-09-09 MED ORDER — VANCOMYCIN HCL 500 MG IV SOLR
500.0000 mg | Freq: Two times a day (BID) | INTRAVENOUS | Status: AC
  Administered 2012-09-10 (×2): 500 mg via INTRAVENOUS
  Filled 2012-09-09 (×3): qty 500

## 2012-09-09 NOTE — Progress Notes (Signed)
Patient has gotten progressively more confused throughout the night since 12:00 am. Black box MD notified of patient change in mental status. Daughter, Selena Batten, notified of her mothers status and the plan for today. Patient awake and alert.

## 2012-09-09 NOTE — Progress Notes (Signed)
Hypotension   500 cc bolus ordered.

## 2012-09-09 NOTE — Op Note (Signed)
[facility] [address] [place], [address]   ENDOSCOPY PROCEDURE REPORT  PATIENT: [name], [name].  MR#: [id number] BIRTHDATE: 1935-04-14 , 77  yrs. old GENDER: Female ENDOSCOPIST:Tanijah Morais Randa Evens, MD REFERRED BY:  [facility] PROCEDURE DATE:  09/09/2012 PROCEDURE:   EGD ASA CLASS:  class III INDICATIONS: unfortunate woman with end-stage MS who is now quadriplegic.Marland Kitchen She has a history of ulcerative colitis in the sigmoid and rectum that has been well-controlled per number of years with mesalamine and has a history of adenomatous polyps removed in the past. She has had positive stools  and a drop in hemoglobin. MEDICATION:  fentanyl 25 mcg, versed to milligrams iV TOPICAL ANESTHETIC: CETACAINE SPRAY  DESCRIPTION OF PROCEDURE:   Pentax upper scope was inserted blindly with deglutination into the esophagus. The patient was maintained on note to set monitoring of cardiac monitoring throughout both procedures. The esophagus was entered was completely normal with no esophagitis or signs of active or recent bleeding. To stomach was entered and carefully examined in both before and retroflex view. There was no active or recent bleeding. No ulcers for gastritis were seen. The pyloric channel was passed and was normal. The duodenum including the 2nd portion and duodenal bulb normal with no ulceration, inflammation or signs of recent or active bleeding. The scope was withdrawn in the initial findings were confirmed. Patient tolerated procedure well.     COMPLICATIONS: None  ENDOSCOPIC IMPRESSION: 1. Positive stool/drop in hemoglobin. No cause found on EGD.  RECOMMENDATIONS: we'll go ahead and proceed with colonoscopy at this time.    _______________________________ Rosalie DoctorCarman Ching, MD 09/09/2012 12:00 PM

## 2012-09-09 NOTE — Progress Notes (Signed)
[facility] PROGRESS NOTE  [name]:[id number] DOB: [birth year] DOA: 09/07/2012 PCP: Theodosia Paling, MD  Assessment/Plan: Principal Problem:   GI bleed Active Problems:   MS (multiple sclerosis)   Tachycardia   Anemia   Hypotension   HCAP (healthcare-associated pneumonia)   DM (diabetes mellitus) type 2, uncontrolled, with ketoacidosis    1. GI Bleed/Anemiia: Patient was found to have HB 7.0, and positive FOBT, against a known baseline HB of 10.3 in 11/2011. She has no documented hematemesis, melena or hematochezia, and MCV was 58.3. This appears to be a chronic iron deficiency anemia, from occult GI blood loss. Transfused  2 units PRBC on 09/08/12. Anemia panel shows very low ferritin, and borderline B12 level. Dr. Randa Evens provided GI consultation, and endoscopic evaluation is planned for today. On iv Protonix. Will transfuse prn.  2. HCAP: Patient's CXR revealed patchy vague left basilar airspace opacity, suggestive of developing infiltrate/pneumonia. She does have a history of recurrent pneumonia, and is currently tachycardic, although afebrile and with normal wcc. Will commence broad-spectrum antibiotic treatment, for HCAP. IV Vancomycin/Cefepime/Levaquin, day #2. Patient is afebrile, and does not look toxic.  3. Tachycardia/Hypotension: Patient presented with a sinus tachycardia, and HR 117-120, as well as a transient hypotension, with SBP of 97. This is likely multifactorial, secondary to profound anemia, volume depletion and possible early sepsis. BP has normalized briefly, but is now borderline to low, and patient is still tachycardic. Bolused NS, and increased maintenance iv fluids. E-link (Dr Deterding) commenced prn iv Lopressor on 09/08/12. Diuretics are on hold.  4. Multiple Sclerosis: This is severe, and patient is quadriplegic. Stable.  5. Diabetes Mellitus: Patient has type 2 DM, and was on Metformin, pre-admission. This appears controlled, based on random blood glucose  of 117. Metformin is on hold, and we shall manage with SSI.Check HBA1C.  6. Ulcerative colitis: Known history of mild ulcerative colitis, controlled oh 5-ASA preparation. Not problematic at this time.      Code Status: Full Code.  Family Communication:  Disposition Plan: To be determined.    Brief narrative: 78 y.o. female resident of the [facility], with known history of HTN, dyslipidemia, DM-2, GERD, multiple sclerosis, anxiety, who was sent to the ED after labs returned with a low hemoglobin level of 7.0. She reports not feeling well for the past week and at the facility labs were drawn and a chest x-ray was done because she has had pneumonia during this time of the year several times before in the past. She denies having any nausea or vomiting or hematemesis or diarrhea, but in the ED she was found to have a heme positive FOBT on rectal examination. She also denied having chest pain SOB, and lightheadedness. Dr Randa Evens, her gastroenterologist was contacted and she was referred for admission. EDP ordered 1 unit of blood for for transfusion.    Consultants:  Dr Randa Evens, gastroenterologist.   Procedures:  CXR.   Antibiotics:  Vancomycin 09/08/12>>>  Cefepime 09/08/12>>>  Levaquin 09/08/12>>>  HPI/Subjective: No new issues.   Objective: Vital signs in last 24 hours: Temp:  [97.2 F (36.2 C)-98.8 F (37.1 C)] 98.5 F (36.9 C) (03/02 0756) Pulse Rate:  [98-125] 114 (03/02 0732) Resp:  [12-26] 25 (03/02 0732) BP: (83-122)/(37-62) 91/46 mmHg (03/02 0732) SpO2:  [92 %-99 %] 97 % (03/02 0732) Weight change:  Last BM Date: 09/09/12  Intake/Output from previous day: 03/01 0701 - 03/02 0700 In: 5665 [P.O.:2950; I.V.:1590; Blood:375; IV Piggyback:750] Out: 1245 [Urine:445; Stool:800]  Physical Exam: General: Comfortable, alert, communicative, fully oriented, not short of breath at rest.  HEENT:  Marked clinical pallor, no jaundice, no conjunctival injection or  discharge. NECK:  Supple, JVP not seen, no carotid bruits, no palpable lymphadenopathy, no palpable goiter. CHEST:  Clinically clear to auscultation, no wheezes, no crackles. HEART:  Sounds 1 and 2 heard, normal, regular, tachycardic, no murmurs. ABDOMEN:  Flat, soft, non-tender, no palpable organomegaly, no palpable masses, normal bowel sounds. GENITALIA:  Not examined. LOWER EXTREMITIES:  No pitting edema, palpable peripheral pulses. MUSCULOSKELETAL SYSTEM:  Generalized osteoarthritic changes, otherwise, normal. CENTRAL NERVOUS SYSTEM:  Quadriplegic. Not formally examined.   Lab Results:  Recent Labs  09/07/12 1634 09/08/12 0430  09/08/12 2056 09/09/12 0603  WBC 6.9 8.5  --   --   --   HGB 7.0* 8.2*  < > 8.9* 8.4*  HCT 25.4* 27.1*  < > 29.1* 28.1*  PLT 439* 419*  --   --   --   < > = values in this interval not displayed.  Recent Labs  09/08/12 0430 09/09/12 0603  NA 142 142  K 3.4* 3.4*  CL 99 107  CO2 29 26  GLUCOSE 117* 108*  BUN 26* 8  CREATININE 0.38* 0.32*  CALCIUM 9.6 8.8   Recent Results (from the past 240 hour(s))  MRSA PCR SCREENING     Status: None   Collection Time    09/07/12 10:14 PM      Result Value Range Status   MRSA by PCR NEGATIVE  NEGATIVE Final   Comment:            The GeneXpert MRSA Assay (FDA     approved for NASAL specimens     only), is one component of a     comprehensive MRSA colonization     surveillance program. It is not     intended to diagnose MRSA     infection nor to guide or     monitor treatment for     MRSA infections.     Studies/Results: Dg Chest Port 1 View  09/07/2012  *RADIOLOGY REPORT*  Clinical Data: Possible pneumonia.  Prior smoker.  Bronchitis.  PORTABLE CHEST - 1 VIEW  Comparison: 11/15/2012  Findings: Patchy opacity in the left lung base.  Right lung is clear.  Heart is normal size.  No effusions.  No acute bony abnormality.  IMPRESSION: Patchy vague left basilar airspace opacity.  This could represent  developing infiltrate/pneumonia.   Original Report Authenticated By: Charlett Nose, M.D.     Medications: Scheduled Meds: . ceFEPime (MAXIPIME) IV  1 g Intravenous Q12H  . gabapentin  100 mg Oral TID  . insulin aspart  0-9 Units Subcutaneous Q4H  . levofloxacin (LEVAQUIN) IV  750 mg Intravenous Q24H  . pantoprazole (PROTONIX) IV  40 mg Intravenous Q12H  . potassium chloride  10 mEq Intravenous Q1 Hr x 4  . sodium chloride  500 mL Intravenous Once  . vancomycin  750 mg Intravenous Q24H   Continuous Infusions: . sodium chloride     PRN Meds:.acetaminophen, acetaminophen, alum & mag hydroxide-simeth, HYDROmorphone (DILAUDID) injection, metoprolol, ondansetron (ZOFRAN) IV, ondansetron, oxyCODONE, zolpidem    LOS: 2 days   OTI,CHRISTOPHER  [facility] Pager [phone]. If 8PM-8AM, please contact night-coverage at [email or web address], password [facility] 09/09/2012, 8:17 AM  LOS: 2 days

## 2012-09-09 NOTE — Op Note (Signed)
[facility] [address] [place], [address]   COLONOSCOPY PROCEDURE REPORT  PATIENT: [name], [name].  MR#: [id number] BIRTHDATE: 1935-03-07 , 77  yrs. old GENDER: Female ENDOSCOPIST: Carman Ching, MD REFERRED BY:   [facility] PROCEDURE DATE:  09/09/2012 PROCEDURE:    Colonoscopy ASA CLASS:    class III INDICATIONS:  unfortunate woman with long history of MS and has progressed quadriplegia. She also has a history of left-sided ulcerative colitis as well as adenomatous colon polyps. She has had stool positive for FOB and dropping hemoglobin. EGD performed before this procedure was negative MEDICATIONS:    fentanyl 25 mcg, versed for milligrams IV  DESCRIPTION OF PROCEDURE:  Pentax pediatric colonoscope is used. Digital rectal exam was performed. Patient had massive external hemorrhoids that were not actively bleeding. The scope was easily passed. Prep was quite good and we were able to easily advance to the cecum. The appendices orifice in the ilio cecal valve were seen. The scope was withdrawn in the: carefully examined upon withdrawal. The mucosa throughout the entire colon was normal. Particularly, the left: was endoscopic with normal in no biopsies or obtained. No polyps, masses, AVMs, or diverticulosis were seen on withdrawal. The retroflex view was grossly normal with no signs of masses or significant hemorrhoids internally. The patient tolerated the procedure well.     COMPLICATIONS: None  ENDOSCOPIC IMPRESSION: 1. Stool positive for FOB/anemia. No obvious cause on colonoscopy except massive external hemorrhoids. 2. History of left-sided ulcerative colitis. Mucosa endoscopic with normal today 3. History of adenomas colon polyps. No polyps seen today.  RECOMMENDATIONS: 1. We'll go ahead and begin patient on mechanically soft diet. We continue to treat ulcerative colitis with mesalamine and constipation with  Miralax.    _______________________________ Rosalie DoctorCarman Ching, MD 09/09/2012 12:09 PM     PATIENT NAME:  Zorana, Brockwell. MR#: [id number]

## 2012-09-09 NOTE — Progress Notes (Signed)
ANTIBIOTIC CONSULT NOTE - FOLLOW UP  Pharmacy Consult for Vancomycin + Levaquin + Cefepime Indication: Empiric HCAP coverage  Allergies  Allergen Reactions  . Contrast Media (Iodinated Diagnostic Agents)     Unknown  . Penicillins     Unknown    Patient Measurements: Height: 5' 4.17" (163 cm) Weight: 115 lb 8.3 oz (52.4 kg) IBW/kg (Calculated) : 55.1  Vital Signs: Temp: 98.8 F (37.1 C) (03/02 1024) Temp src: Oral (03/02 1020) BP: 108/61 mmHg (03/02 1210) Pulse Rate: 114 (03/02 0732) Intake/Output from previous day: 03/01 0701 - 03/02 0700 In: 5665 [P.O.:2950; I.V.:1590; Blood:375; IV Piggyback:750] Out: 1245 [Urine:445; Stool:800] Intake/Output from this shift: Total I/O In: -  Out: 55 [Urine:55]  Labs:  Recent Labs  09/07/12 1634 09/08/12 0430 09/08/12 1220 09/08/12 2056 09/09/12 0603  WBC 6.9 8.5  --   --   --   HGB 7.0* 8.2* 9.3* 8.9* 8.4*  PLT 439* 419*  --   --   --   CREATININE 0.35* 0.38*  --   --  0.32*   Estimated Creatinine Clearance: 50.3 ml/min (by C-G formula based on Cr of 0.32). No results found for this basename: VANCOTROUGH, Leodis Binet, VANCORANDOM, GENTTROUGH, GENTPEAK, GENTRANDOM, TOBRATROUGH, TOBRAPEAK, TOBRARND, AMIKACINPEAK, AMIKACINTROU, AMIKACIN,  in the last 72 hours   Microbiology: Recent Results (from the past 720 hour(s))  MRSA PCR SCREENING     Status: None   Collection Time    09/07/12 10:14 PM      Result Value Range Status   MRSA by PCR NEGATIVE  NEGATIVE Final   Comment:            The GeneXpert MRSA Assay (FDA     approved for NASAL specimens     only), is one component of a     comprehensive MRSA colonization     surveillance program. It is not     intended to diagnose MRSA     infection nor to guide or     monitor treatment for     MRSA infections.  CULTURE, BLOOD (ROUTINE X 2)     Status: None   Collection Time    09/08/12 12:20 PM      Result Value Range Status   Specimen Description BLOOD LEFT ARM   Final   Special Requests BOTTLES DRAWN AEROBIC ONLY 10CC   Final   Culture  Setup Time 09/08/2012 17:28   Final   Culture     Final   Value:        BLOOD CULTURE RECEIVED NO GROWTH TO DATE CULTURE WILL BE HELD FOR 5 DAYS BEFORE ISSUING A FINAL NEGATIVE REPORT   Report Status PENDING   Incomplete  CULTURE, BLOOD (ROUTINE X 2)     Status: None   Collection Time    09/08/12 12:28 PM      Result Value Range Status   Specimen Description BLOOD LEFT ARM   Final   Special Requests BOTTLES DRAWN AEROBIC ONLY 10CC   Final   Culture  Setup Time 09/08/2012 17:28   Final   Culture     Final   Value:        BLOOD CULTURE RECEIVED NO GROWTH TO DATE CULTURE WILL BE HELD FOR 5 DAYS BEFORE ISSUING A FINAL NEGATIVE REPORT   Report Status PENDING   Incomplete    Anti-infectives   Start     Dose/Rate Route Frequency Ordered Stop   09/10/12 0400  vancomycin (VANCOCIN) 500 mg in sodium chloride  0.9 % 100 mL IVPB     500 mg 100 mL/hr over 60 Minutes Intravenous Every 12 hours 09/09/12 1300     09/08/12 1000  vancomycin (VANCOCIN) 750 mg in sodium chloride 0.9 % 150 mL IVPB  Status:  Discontinued     750 mg 150 mL/hr over 60 Minutes Intravenous Every 24 hours 09/08/12 0847 09/09/12 1300   09/08/12 0930  ceFEPIme (MAXIPIME) 1 g in dextrose 5 % 50 mL IVPB     1 g 100 mL/hr over 30 Minutes Intravenous Every 12 hours 09/08/12 0847     09/08/12 0900  levofloxacin (LEVAQUIN) IVPB 750 mg     750 mg 100 mL/hr over 90 Minutes Intravenous Every 24 hours 09/08/12 0847        Assessment: 78 y.o. F who continues on Vancomycin + Levaquin + Cefepime for empiric HCAP coverage. The patient is afebrile, WBC are wnl. CXR on 2/28 showed "patchy left basilar airspace opacity, infiltrate/PNA". Renal function has improved slightly today -- and given this along with prior known Vancomycin doses and troughs -- will increase the Vancomycin dose. If patient continues to improve -- may consider de-escalation to monotherapy with one  antibiotic soon.   Cefepime 3/1 >> Levaquin 3/1 >> Vanc 3/1 >>  3/1 BCx >> ngx1d 3/1 UCx >> 3/1 MRSA PCR neg  Goal of Therapy:  Vancomycin trough level 15-20 mcg/ml Proper antibiotics for infection/cultures adjusted for renal/hepatic function   Plan:  1. Increase Vancomycin to 500 mg IV every 12 hours 2. Continue Levaquin 750 mg IV every 24 hours 3. Continue Cefepime 1g IV every 12 hours 4. Will continue to follow renal function, culture results, LOT, and antibiotic de-escalation plans   Georgina Pillion, PharmD, BCPS Clinical Pharmacist Pager: [phone] 09/09/2012 1:05 PM

## 2012-09-09 NOTE — Interval H&P Note (Signed)
History and Physical Interval Note:  09/09/2012 11:11 AM  [name]  has presented today for surgery, with the diagnosis of anemia, heme positive stools  The various methods of treatment have been discussed with the patient and family. After consideration of risks, benefits and other options for treatment, the patient has consented to  Procedure(s) with comments: ESOPHAGOGASTRODUODENOSCOPY (EGD) (N/A) COLONOSCOPY (N/A) - possible flex sig as a surgical intervention .  The patient's history has been reviewed, patient examined, no change in status, stable for surgery.  I have reviewed the patient's chart and labs.  Questions were answered to the patient's satisfaction.     EDWARDS JR,JAMES L

## 2012-09-10 ENCOUNTER — Encounter (HOSPITAL_COMMUNITY): Payer: Self-pay | Admitting: Gastroenterology

## 2012-09-10 LAB — GLUCOSE, CAPILLARY
Glucose-Capillary: 62 mg/dL — ABNORMAL LOW (ref 70–99)
Glucose-Capillary: 111 mg/dL — ABNORMAL HIGH (ref 70–99)
Glucose-Capillary: 178 mg/dL — ABNORMAL HIGH (ref 70–99)

## 2012-09-10 LAB — BASIC METABOLIC PANEL
Chloride: 111 mEq/L (ref 96–112)
GFR calc Af Amer: >90 mL/min (ref >90)
Potassium: 3.5 mEq/L (ref 3.5–5.1)

## 2012-09-10 LAB — PREPARE RBC (CROSSMATCH)

## 2012-09-10 LAB — MAGNESIUM: Magnesium: 1.7 mg/dL (ref 1.5–2.5)

## 2012-09-10 LAB — HEMOGLOBIN AND HEMATOCRIT, BLOOD
HCT: 27.5 % — ABNORMAL LOW (ref 36.0–46.0)
Hemoglobin: 8.3 g/dL — ABNORMAL LOW (ref 12.0–15.0)

## 2012-09-10 MED ORDER — FERROUS SULFATE 325 (65 FE) MG PO TABS
325.0000 mg | ORAL_TABLET | Freq: Two times a day (BID) | ORAL | Status: DC
  Administered 2012-09-10 – 2012-09-11 (×2): 325 mg via ORAL
  Filled 2012-09-10 (×5): qty 1

## 2012-09-10 MED ORDER — VITAMIN B-12 100 MCG PO TABS
100.0000 ug | ORAL_TABLET | Freq: Every day | ORAL | Status: DC
  Administered 2012-09-10 – 2012-09-11 (×2): 100 ug via ORAL
  Filled 2012-09-10 (×2): qty 1

## 2012-09-10 NOTE — Care Management Note (Signed)
    Page 1 of 1   09/10/2012     3:35:17 PM   CARE MANAGEMENT NOTE 09/10/2012  Patient:  [name], [name]   Account Number:  [id number]  Date Initiated:  09/10/2012  Documentation initiated by:  Avie Arenas  Subjective/Objective Assessment:   anemia - hbg 7.0 - colonoscopy.  From [facility].     Action/Plan:   Anticipated DC Date:  09/13/2012   Anticipated DC Plan:  SKILLED NURSING FACILITY  In-house referral  Clinical Social Worker      DC Planning Services  CM consult      Choice offered to / List presented to:             Status of service:  In process, will continue to follow Medicare Important Message given?   (If response is "NO", the following Medicare IM given date fields will be blank) Date Medicare IM given:   Date Additional Medicare IM given:    Discharge Disposition:    Per UR Regulation:  Reviewed for med. necessity/level of care/duration of stay  If discussed at Long Length of Stay Meetings, dates discussed:    Comments:

## 2012-09-10 NOTE — Progress Notes (Signed)
[facility] PROGRESS NOTE  [name] ZOX:[id number] DOB: [birth year] DOA: 09/07/2012 PCP: Theodosia Paling, MD  Assessment/Plan: Principal Problem:   GI bleed Active Problems:   MS (multiple sclerosis)   Tachycardia   Anemia   Hypotension   HCAP (healthcare-associated pneumonia)   DM (diabetes mellitus) type 2, uncontrolled, with ketoacidosis    1. GI Bleed/Anemiia: Patient was found to have HB 7.0, and positive FOBT, against a known baseline HB of 10.3 in 11/2011. She has no documented hematemesis, melena or hematochezia, and MCV was 58.3. This appears to be a chronic iron deficiency anemia, from occult GI blood loss. Transfused  2 units PRBC on 09/08/12. Anemia panel shows very low ferritin, and borderline B12 level. Dr. Randa Evens provided GI consultation, and EGD on 09/09/12, was negative. Colonoscopy revealed no obvious cause, except massive external hemorrhoids.  Mucosa was endoscopically normal. HB is 8.3 today, so will transfuse one more unit PRBC and start iron and vitamin B12 supplements.  2. HCAP: Patient's CXR revealed patchy vague left basilar airspace opacity, suggestive of developing infiltrate/pneumonia. She does have a history of recurrent pneumonia, and was tachycardic at presentation, although afebrile and with normal wcc. Managing for HCAP with iv Vancomycin/Cefepime/Levaquin, day #3. Patient does not look toxic, remains afebrile, and feels considerably better. Will likely transition to Levaquin monotherapy, from 09/11/12.  3. Tachycardia/Hypotension: Patient presented with a sinus tachycardia, and HR 117-120, as well as a transient hypotension, with SBP of 97. This is likely multifactorial, secondary to profound anemia, volume depletion and possible early sepsis. BP has normalized briefly, but is now borderline to low, and patient has received intermittent Saline boluses. Diuretics are on hold. As of 09/10/12, tachycardia has resolved.  4. Multiple Sclerosis: This is severe, and  patient is quadriplegic. Stable.  5. Diabetes Mellitus: Patient has type 2 DM, and was on Metformin, pre-admission. This appears controlled, based on random blood glucose of 117. Metformin is on hold, and we shall manage with SSI.Check HBA1C.  6. Ulcerative colitis: Known history of mild ulcerative colitis, controlled on 5-ASA preparation. Not problematic at this time.      Code Status: Full Code.  Family Communication:  Disposition Plan: To be determined. Stable for transfer to telemetry floor.    Brief narrative: 78 y.o. female resident of the [facility], with known history of HTN, dyslipidemia, DM-2, GERD, multiple sclerosis, anxiety, who was sent to the ED after labs returned with a low hemoglobin level of 7.0. She reports not feeling well for the past week and at the facility labs were drawn and a chest x-ray was done because she has had pneumonia during this time of the year several times before in the past. She denies having any nausea or vomiting or hematemesis or diarrhea, but in the ED she was found to have a heme positive FOBT on rectal examination. She also denied having chest pain SOB, and lightheadedness. Dr Randa Evens, her gastroenterologist was contacted and she was referred for admission. EDP ordered 1 unit of blood for for transfusion.    Consultants:  Dr Randa Evens, gastroenterologist.   Procedures:  CXR.   Antibiotics:  Vancomycin 09/08/12>>>  Cefepime 09/08/12>>>  Levaquin 09/08/12>>>  HPI/Subjective: No new issues.   Objective: Vital signs in last 24 hours: Temp:  [97.4 F (36.3 C)-98.8 F (37.1 C)] 98.4 F (36.9 C) (03/03 0743) Pulse Rate:  [82-106] 93 (03/03 0700) Resp:  [10-24] 10 (03/03 0000) BP: (81-155)/(30-79) 108/46 mmHg (03/03 0700) SpO2:  [73 %-100 %] 92 % (03/03  0700) Weight change:  Last BM Date: 09/09/12  Intake/Output from previous day: 03/02 0701 - 03/03 0700 In: 3493.3 [I.V.:1993.3; IV Piggyback:1500] Out: 701 [Urine:701] Total  I/O In: 100 [I.V.:100] Out: -    Physical Exam: General: Comfortable, alert, communicative, fully oriented, not short of breath at rest.  HEENT:  Moderate clinical pallor, no jaundice, no conjunctival injection or discharge. NECK:  Supple, JVP not seen, no carotid bruits, no palpable lymphadenopathy, no palpable goiter. CHEST:  Clinically clear to auscultation, no wheezes, no crackles. HEART:  Sounds 1 and 2 heard, normal, regular, tachycardic, no murmurs. ABDOMEN:  Flat, soft, non-tender, no palpable organomegaly, no palpable masses, normal bowel sounds. GENITALIA:  Not examined. LOWER EXTREMITIES:  No pitting edema, palpable peripheral pulses. MUSCULOSKELETAL SYSTEM:  Generalized osteoarthritic changes, otherwise, normal. CENTRAL NERVOUS SYSTEM:  Quadriplegic. Not formally examined.   Lab Results:  Recent Labs  09/08/12 0430  09/09/12 1957 09/10/12 0709  WBC 8.5  --  6.7  --   HGB 8.2*  < > 8.4* 8.3*  HCT 27.1*  < > 28.1* 27.5*  PLT 419*  --  279  --   < > = values in this interval not displayed.  Recent Labs  09/09/12 1957 09/10/12 0709  NA 138 140  K 3.8 3.5  CL 109 111  CO2 22 22  GLUCOSE 254* 120*  BUN 5* 5*  CREATININE 0.30* 0.31*  CALCIUM 8.2* 8.8   Recent Results (from the past 240 hour(s))  MRSA PCR SCREENING     Status: None   Collection Time    09/07/12 10:14 PM      Result Value Range Status   MRSA by PCR NEGATIVE  NEGATIVE Final   Comment:            The GeneXpert MRSA Assay (FDA     approved for NASAL specimens     only), is one component of a     comprehensive MRSA colonization     surveillance program. It is not     intended to diagnose MRSA     infection nor to guide or     monitor treatment for     MRSA infections.  URINE CULTURE     Status: None   Collection Time    09/07/12 10:40 PM      Result Value Range Status   Specimen Description URINE, CLEAN CATCH   Final   Special Requests NONE   Final   Culture  Setup Time 09/08/2012 05:23    Final   Colony Count 95,000 COLONIES/ML   Final   Culture     Final   Value: Multiple bacterial morphotypes present, none predominant. Suggest appropriate recollection if clinically indicated.   Report Status 09/09/2012 FINAL   Final  CULTURE, BLOOD (ROUTINE X 2)     Status: None   Collection Time    09/08/12 12:20 PM      Result Value Range Status   Specimen Description BLOOD LEFT ARM   Final   Special Requests BOTTLES DRAWN AEROBIC ONLY 10CC   Final   Culture  Setup Time 09/08/2012 17:28   Final   Culture     Final   Value:        BLOOD CULTURE RECEIVED NO GROWTH TO DATE CULTURE WILL BE HELD FOR 5 DAYS BEFORE ISSUING A FINAL NEGATIVE REPORT   Report Status PENDING   Incomplete  CULTURE, BLOOD (ROUTINE X 2)     Status: None   Collection Time  09/08/12 12:28 PM      Result Value Range Status   Specimen Description BLOOD LEFT ARM   Final   Special Requests BOTTLES DRAWN AEROBIC ONLY 10CC   Final   Culture  Setup Time 09/08/2012 17:28   Final   Culture     Final   Value:        BLOOD CULTURE RECEIVED NO GROWTH TO DATE CULTURE WILL BE HELD FOR 5 DAYS BEFORE ISSUING A FINAL NEGATIVE REPORT   Report Status PENDING   Incomplete     Studies/Results: No results found.  Medications: Scheduled Meds: . ceFEPime (MAXIPIME) IV  1 g Intravenous Q12H  . gabapentin  100 mg Oral TID  . insulin aspart  0-9 Units Subcutaneous Q4H  . levofloxacin (LEVAQUIN) IV  750 mg Intravenous Q24H  . pantoprazole (PROTONIX) IV  40 mg Intravenous Q12H  . polyethylene glycol  17 g Oral Once  . vancomycin  500 mg Intravenous Q12H   Continuous Infusions: . sodium chloride 100 mL/hr at 09/09/12 2358  . sodium chloride     PRN Meds:.acetaminophen, acetaminophen, alum & mag hydroxide-simeth, HYDROmorphone (DILAUDID) injection, metoprolol, ondansetron (ZOFRAN) IV, ondansetron, oxyCODONE, zolpidem    LOS: 3 days   OTI,CHRISTOPHER  [facility] Pager [phone]. If 8PM-8AM, please contact  night-coverage at [email or web address], password [facility] 09/10/2012, 8:30 AM  LOS: 3 days

## 2012-09-10 NOTE — Progress Notes (Signed)
[facility] Progress Note  Subjective: Patient without complaints, no bowel movements since colonoscopy  Objective: Vital signs in last 24 hours: Temp:  [97.4 F (36.3 C)-98.4 F (36.9 C)] 98.1 F (36.7 C) (03/03 1000) Pulse Rate:  [82-106] 106 (03/03 1000) Resp:  [10-23] 20 (03/03 1000) BP: (81-155)/(30-79) 108/53 mmHg (03/03 1000) SpO2:  [73 %-100 %] 95 % (03/03 1000) Weight change:    PE: Unchanged  Lab Results: Results for orders placed during the hospital encounter of 09/07/12 (from the past 24 hour(s))  HEMOGLOBIN AND HEMATOCRIT, BLOOD     Status: Abnormal   Collection Time    09/09/12 12:40 PM      Result Value Range   Hemoglobin 8.6 (*) 12.0 - 15.0 g/dL   HCT 30.8 (*) 65.7 - 84.6 %  GLUCOSE, CAPILLARY     Status: Abnormal   Collection Time    09/09/12  1:03 PM      Result Value Range   Glucose-Capillary 118 (*) 70 - 99 mg/dL  GLUCOSE, CAPILLARY     Status: Abnormal   Collection Time    09/09/12  3:25 PM      Result Value Range   Glucose-Capillary 178 (*) 70 - 99 mg/dL  GLUCOSE, CAPILLARY     Status: Abnormal   Collection Time    09/09/12  7:23 PM      Result Value Range   Glucose-Capillary 234 (*) 70 - 99 mg/dL   Comment 1 Notify RN    CBC     Status: Abnormal   Collection Time    09/09/12  7:57 PM      Result Value Range   WBC 6.7  4.0 - 10.5 K/uL   RBC 4.30  3.87 - 5.11 MIL/uL   Hemoglobin 8.4 (*) 12.0 - 15.0 g/dL   HCT 96.2 (*) 95.2 - 84.1 %   MCV 65.3 (*) 78.0 - 100.0 fL   MCH 19.5 (*) 26.0 - 34.0 pg   MCHC 29.9 (*) 30.0 - 36.0 g/dL   RDW 32.4 (*) 40.1 - 02.7 %   Platelets 279  150 - 400 K/uL  COMPREHENSIVE METABOLIC PANEL     Status: Abnormal   Collection Time    09/09/12  7:57 PM      Result Value Range   Sodium 138  135 - 145 mEq/L   Potassium 3.8  3.5 - 5.1 mEq/L   Chloride 109  96 - 112 mEq/L   CO2 22  19 - 32 mEq/L   Glucose, Bld 254 (*) 70 - 99 mg/dL   BUN 5 (*) 6 - 23 mg/dL   Creatinine, Ser 2.53 (*) 0.50 - 1.10 mg/dL    Calcium 8.2 (*) 8.4 - 10.5 mg/dL   Total Protein 5.7 (*) 6.0 - 8.3 g/dL   Albumin 2.5 (*) 3.5 - 5.2 g/dL   AST 11  0 - 37 U/L   ALT 6  0 - 35 U/L   Alkaline Phosphatase 43  39 - 117 U/L   Total Bilirubin 0.1 (*) 0.3 - 1.2 mg/dL   GFR calc non Af Amer >90  >90 mL/min   GFR calc Af Amer >90  >90 mL/min  MAGNESIUM     Status: None   Collection Time    09/09/12  7:57 PM      Result Value Range   Magnesium 1.7  1.5 - 2.5 mg/dL  GLUCOSE, CAPILLARY     Status: Abnormal   Collection Time    09/09/12 11:35  PM      Result Value Range   Glucose-Capillary 62 (*) 70 - 99 mg/dL   Comment 1 Notify RN    GLUCOSE, CAPILLARY     Status: None   Collection Time    09/10/12  3:53 AM      Result Value Range   Glucose-Capillary 92  70 - 99 mg/dL  HEMOGLOBIN AND HEMATOCRIT, BLOOD     Status: Abnormal   Collection Time    09/10/12  7:09 AM      Result Value Range   Hemoglobin 8.3 (*) 12.0 - 15.0 g/dL   HCT 16.1 (*) 09.6 - 04.5 %  BASIC METABOLIC PANEL     Status: Abnormal   Collection Time    09/10/12  7:09 AM      Result Value Range   Sodium 140  135 - 145 mEq/L   Potassium 3.5  3.5 - 5.1 mEq/L   Chloride 111  96 - 112 mEq/L   CO2 22  19 - 32 mEq/L   Glucose, Bld 120 (*) 70 - 99 mg/dL   BUN 5 (*) 6 - 23 mg/dL   Creatinine, Ser 4.09 (*) 0.50 - 1.10 mg/dL   Calcium 8.8  8.4 - 81.1 mg/dL   GFR calc non Af Amer >90  >90 mL/min   GFR calc Af Amer >90  >90 mL/min  MAGNESIUM     Status: None   Collection Time    09/10/12  7:09 AM      Result Value Range   Magnesium 1.7  1.5 - 2.5 mg/dL  GLUCOSE, CAPILLARY     Status: Abnormal   Collection Time    09/10/12  7:15 AM      Result Value Range   Glucose-Capillary 111 (*) 70 - 99 mg/dL  PREPARE RBC (CROSSMATCH)     Status: None   Collection Time    09/10/12  9:00 AM      Result Value Range   Order Confirmation ORDER PROCESSED BY BLOOD BANK      Studies/Results: No results found.    Assessment:  anemia and rectal bleeding, no  high-risk lesions seen on EGD and colonoscopy  Plan: Advance diet. Will sign off for now.    HAYES,JOHN C 09/10/2012, 10:37 AM

## 2012-09-11 LAB — TYPE AND SCREEN
ABO/RH(D): O POS
Antibody Screen: NEGATIVE
Unit division: 0

## 2012-09-11 LAB — GLUCOSE, CAPILLARY
Glucose-Capillary: 137 mg/dL — ABNORMAL HIGH (ref 70–99)
Glucose-Capillary: 138 mg/dL — ABNORMAL HIGH (ref 70–99)
Glucose-Capillary: 161 mg/dL — ABNORMAL HIGH (ref 70–99)

## 2012-09-11 LAB — CBC
HCT: 31.2 % — ABNORMAL LOW (ref 36.0–46.0)
Hemoglobin: 9.5 g/dL — ABNORMAL LOW (ref 12.0–15.0)
MCH: 20.7 pg — ABNORMAL LOW (ref 26.0–34.0)
MCHC: 30.4 g/dL (ref 30.0–36.0)
MCV: 68 fL — ABNORMAL LOW (ref 78.0–100.0)
RBC: 4.59 MIL/uL (ref 3.87–5.11)

## 2012-09-11 LAB — COMPREHENSIVE METABOLIC PANEL
ALT: 6 U/L (ref 0–35)
AST: 13 U/L (ref 0–37)
Albumin: 2.4 g/dL — ABNORMAL LOW (ref 3.5–5.2)
Alkaline Phosphatase: 41 U/L (ref 39–117)
Chloride: 115 mEq/L — ABNORMAL HIGH (ref 96–112)
Total Bilirubin: 0.1 mg/dL — ABNORMAL LOW (ref 0.3–1.2)

## 2012-09-11 MED ORDER — LEVOFLOXACIN 500 MG PO TABS: 500.0000 mg | ORAL_TABLET | Freq: Every day | ORAL | Status: DC

## 2012-09-11 MED ORDER — CYANOCOBALAMIN 100 MCG PO TABS: 100.0000 ug | ORAL_TABLET | Freq: Every day | ORAL | Status: AC

## 2012-09-11 MED ORDER — POTASSIUM CHLORIDE CRYS ER 20 MEQ PO TBCR
40.0000 meq | EXTENDED_RELEASE_TABLET | Freq: Once | ORAL | Status: AC
  Administered 2012-09-11: 40 meq via ORAL
  Filled 2012-09-11: qty 2

## 2012-09-11 MED ORDER — FERROUS SULFATE 325 (65 FE) MG PO TABS: 325.0000 mg | ORAL_TABLET | Freq: Two times a day (BID) | ORAL | Status: DC

## 2012-09-11 NOTE — Discharge Summary (Signed)
Physician Discharge Summary  [name] ZOX:[id number] DOB: 09/13/1934 DOA: 09/07/2012  PCP: Oneal Grout, MD  Admit date: 09/07/2012 Discharge date: 09/11/2012  Time spent: 40  minutes  Recommendations for Outpatient Follow-up:  1. Follow up with Primary MD.   Discharge Diagnoses:  Principal Problem:   GI bleed Active Problems:   MS (multiple sclerosis)   Tachycardia   Anemia   Hypotension   HCAP (healthcare-associated pneumonia)   DM (diabetes mellitus) type 2, uncontrolled, with ketoacidosis   Discharge Condition: Satisfactory.   Diet recommendation: Carbohydrate-modified.   Filed Weights   09/08/12 0600  Weight: 52.4 kg (115 lb 8.3 oz)    History of present illness:  78 y.o. female resident of the [facility], with known history of HTN, dyslipidemia, DM-2, GERD, multiple sclerosis, quadriplegia, ulcerative colitis, anxiety, who was sent to the ED after labs returned with a low hemoglobin level of 7.0. She reports not feeling well for the past week and at the facility labs were drawn and a chest x-ray was done because she has had pneumonia during this time of the year several times before in the past. She denies having any nausea or vomiting or hematemesis or diarrhea, but in the ED she was found to have a heme positive FOBT on rectal examination. She also denied having chest pain SOB, and lightheadedness. Dr Randa Evens, her gastroenterologist was contacted and she was referred for admission. EDP ordered 1 unit of blood for for transfusion.    Hospital Course:  1. GI Bleed/Anemiia: Patient was found to have HB 7.0, and positive FOBT, against a known baseline HB of 10.3 in 11/2011. She had no documented hematemesis, melena or hematochezia, and MCV was 58.3. This appears to be a chronic iron deficiency anemia, from occult GI blood loss. Transfused a total of 3 units PRBC during this hospitalization, and as of 09/11/12, HB is 9.5. Anemia panel showed very low ferritin, and  borderline B12 level. Dr. Randa Evens provided GI consultation, and EGD on 09/09/12, was negative. Colonoscopy revealed no obvious cause, except massive external hemorrhoids.  Mucosa was endoscopically normal. Commenced on iron and vitamin B12 supplements.  2. HCAP: Patient's CXR revealed patchy vague left basilar airspace opacity, suggestive of developing infiltrate/pneumonia. She does have a history of recurrent pneumonia, and was tachycardic at presentation, although afebrile and with normal wcc. Managed for HCAP with iv Vancomycin/Cefepime/Levaquin initially, patient never looked toxic, remained afebrile, and as of 09/11/12, feels considerably better. Transitioned to oral Levaquin monotherapy, from 09/11/12, for a further 7 days, to be concluded on 09/17/12.  3. Tachycardia/Hypotension: Patient presented with a sinus tachycardia, and HR 117-120, as well as a transient hypotension, with SBP of 97. This is likely multifactorial, secondary to profound anemia, volume depletion and possible early sepsis. Patient was managed with intermittent Saline boluses, as well as maintenance iv fluids, with resolution. Diuretics are on hold. As of 09/10/12, tachycardia had resolved.  4. Multiple Sclerosis: This is severe, and patient is quadriplegic. Otherwise stable.  5. Diabetes Mellitus: Patient has type 2 DM, and was on Metformin, pre-admission. This appears controlled, based on random blood glucose of 117. Metformin and glyburide were held during hospitalization, and patient managed with SSI. HBA1C is pending. Pre-admission oral hypoglycemics have been reinstated on discharge.  6. Ulcerative colitis: Known history of mild ulcerative colitis, controlled on 5-ASA preparation. Not problematic at this time.    Procedures:  See Below.  EGD.  Colonoscopy.   Consultations:  Dr Carman Ching, gastroenterologist.  Discharge Exam: Filed Vitals:   09/11/12 0600 09/11/12 0700 09/11/12 0800 09/11/12 0900  BP: 119/49 130/62  134/58 127/50  Pulse: 86 91 90 88  Temp:   97.7 F (36.5 C)   TempSrc:   Oral   Resp: 22 21 20 22   Height:      Weight:      SpO2: 92% 97% 95% 96%    General: Comfortable, alert, communicative, fully oriented, not short of breath at rest.  HEENT: Mild clinical pallor, no jaundice, no conjunctival injection or discharge.  NECK: Supple, JVP not seen, no carotid bruits, no palpable lymphadenopathy, no palpable goiter.  CHEST: Clinically clear to auscultation, no wheezes, no crackles.  HEART: Sounds 1 and 2 heard, normal, regular, tachycardic, no murmurs.  ABDOMEN: Flat, soft, non-tender, no palpable organomegaly, no palpable masses, normal bowel sounds.  GENITALIA: Not examined.  LOWER EXTREMITIES: No pitting edema, palpable peripheral pulses.  MUSCULOSKELETAL SYSTEM: Generalized osteoarthritic changes, otherwise, normal.  CENTRAL NERVOUS SYSTEM: Quadriplegic. Not formally examined.   Discharge Instructions      Discharge Orders   Future Orders Complete By Expires     Diet Carb Modified  As directed     Increase activity slowly  As directed         Medication List    STOP taking these medications       furosemide 20 MG tablet  Commonly known as:  LASIX     moxifloxacin 400 MG tablet  Commonly known as:  AVELOX      TAKE these medications       aspirin 81 MG tablet  Take 81 mg by mouth daily.     atorvastatin 20 MG tablet  Commonly known as:  LIPITOR  Take 20 mg by mouth See admin instructions. Takes 1 tablet daily on mondays, Wednesdays, and fridays.     cholecalciferol 1000 UNITS tablet  Commonly known as:  VITAMIN D  Take 2,000 Units by mouth 2 (two) times daily.     cyanocobalamin 100 MCG tablet  Take 1 tablet (100 mcg total) by mouth daily.     dimethicone-zinc oxide cream  Apply 1 application topically 2 (two) times daily as needed for dry skin.     ferrous sulfate 325 (65 FE) MG tablet  Take 1 tablet (325 mg total) by mouth 2 (two) times daily with a  meal.     gabapentin 100 MG capsule  Commonly known as:  NEURONTIN  Take 100 mg by mouth 3 (three) times daily.     glyBURIDE 2.5 MG tablet  Commonly known as:  DIABETA  Take 2.5 mg by mouth 2 (two) times daily with a meal.     hydrOXYzine 25 MG tablet  Commonly known as:  ATARAX/VISTARIL  Take 25 mg by mouth 3 (three) times daily as needed for itching.     ipratropium 0.02 % nebulizer solution  Commonly known as:  ATROVENT  Take 500 mcg by nebulization 3 (three) times daily. For 3 days starting 09/07/12     levofloxacin 500 MG tablet  Commonly known as:  LEVAQUIN  Take 1 tablet (500 mg total) by mouth daily.     metFORMIN 750 MG 24 hr tablet  Commonly known as:  GLUCOPHAGE-XR  Take 750 mg by mouth 2 (two) times daily.     metoprolol tartrate 25 MG tablet  Commonly known as:  LOPRESSOR  Take 1 tablet (25 mg total) by mouth 2 (two) times daily.     pantoprazole 40  MG tablet  Commonly known as:  PROTONIX  Take 40 mg by mouth daily.     polyethylene glycol packet  Commonly known as:  MIRALAX / GLYCOLAX  Take 17 g by mouth every other day.     polyvinyl alcohol 1.4 % ophthalmic solution  Commonly known as:  LIQUIFILM TEARS  Place 1 drop into both eyes 3 (three) times daily as needed (dry eyes).     sulfaDIAZINE 500 MG tablet  Take 1,000 mg by mouth 2 (two) times daily.       Follow-up Information   Schedule an appointment as soon as possible for a visit with Oneal Grout, MD.   Contact information:   [address] [facility] [place] [address] [phone]       Schedule an appointment as soon as possible for a visit with EDWARDS Burna Mortimer, MD.   Contact information:   [address]                         [facility] [place] [address] [phone]        The results of significant diagnostics from this hospitalization (including imaging, microbiology, ancillary and laboratory) are listed below for reference.     Significant Diagnostic Studies: Dg Chest Port 1 View  09/07/2012  *RADIOLOGY REPORT*  Clinical Data: Possible pneumonia.  Prior smoker.  Bronchitis.  PORTABLE CHEST - 1 VIEW  Comparison: 11/15/2012  Findings: Patchy opacity in the left lung base.  Right lung is clear.  Heart is normal size.  No effusions.  No acute bony abnormality.  IMPRESSION: Patchy vague left basilar airspace opacity.  This could represent developing infiltrate/pneumonia.   Original Report Authenticated By: Charlett Nose, M.D.     Microbiology: Recent Results (from the past 240 hour(s))  MRSA PCR SCREENING     Status: None   Collection Time    09/07/12 10:14 PM      Result Value Range Status   MRSA by PCR NEGATIVE  NEGATIVE Final   Comment:            The GeneXpert MRSA Assay (FDA     approved for NASAL specimens     only), is one component of a     comprehensive MRSA colonization     surveillance program. It is not     intended to diagnose MRSA     infection nor to guide or     monitor treatment for     MRSA infections.  URINE CULTURE     Status: None   Collection Time    09/07/12 10:40 PM      Result Value Range Status   Specimen Description URINE, CLEAN CATCH   Final   Special Requests NONE   Final   Culture  Setup Time 09/08/2012 05:23   Final   Colony Count 95,000 COLONIES/ML   Final   Culture     Final   Value: Multiple bacterial morphotypes present, none predominant. Suggest appropriate recollection if clinically indicated.   Report Status 09/09/2012 FINAL   Final  CULTURE, BLOOD (ROUTINE X 2)     Status: None   Collection Time    09/08/12 12:20 PM      Result Value Range Status   Specimen Description BLOOD LEFT ARM   Final   Special Requests BOTTLES DRAWN AEROBIC ONLY 10CC   Final   Culture  Setup Time 09/08/2012 17:28   Final  Culture     Final   Value:        BLOOD CULTURE RECEIVED NO GROWTH TO DATE CULTURE WILL BE HELD FOR 5 DAYS BEFORE ISSUING A FINAL NEGATIVE REPORT   Report Status  PENDING   Incomplete  CULTURE, BLOOD (ROUTINE X 2)     Status: None   Collection Time    09/08/12 12:28 PM      Result Value Range Status   Specimen Description BLOOD LEFT ARM   Final   Special Requests BOTTLES DRAWN AEROBIC ONLY 10CC   Final   Culture  Setup Time 09/08/2012 17:28   Final   Culture     Final   Value:        BLOOD CULTURE RECEIVED NO GROWTH TO DATE CULTURE WILL BE HELD FOR 5 DAYS BEFORE ISSUING A FINAL NEGATIVE REPORT   Report Status PENDING   Incomplete     Labs: Basic Metabolic Panel:  Recent Labs Lab 09/08/12 0430 09/09/12 0603 09/09/12 0830 09/09/12 1957 09/10/12 0709 09/11/12 0400  NA 142 142  --  138 140 144  K 3.4* 3.4*  --  3.8 3.5 3.4*  CL 99 107  --  109 111 115*  CO2 29 26  --  22 22 21   GLUCOSE 117* 108*  --  254* 120* 146*  BUN 26* 8  --  5* 5* 8  CREATININE 0.38* 0.32*  --  0.30* 0.31* 0.40*  CALCIUM 9.6 8.8  --  8.2* 8.8 8.7  MG  --   --  1.1* 1.7 1.7  --    Liver Function Tests:  Recent Labs Lab 09/07/12 1634 09/09/12 1957 09/11/12 0400  AST 14 11 13   ALT 7 6 6   ALKPHOS 49 43 41  BILITOT 0.1* 0.1* 0.1*  PROT 7.8 5.7* 5.5*  ALBUMIN 3.9 2.5* 2.4*   No results found for this basename: LIPASE, AMYLASE,  in the last 168 hours No results found for this basename: AMMONIA,  in the last 168 hours CBC:  Recent Labs Lab 09/07/12 1634 09/08/12 0430  09/09/12 0603 09/09/12 1240 09/09/12 1957 09/10/12 0709 09/11/12 0400  WBC 6.9 8.5  --   --   --  6.7  --  8.4  NEUTROABS 3.7  --   --   --   --   --   --   --   HGB 7.0* 8.2*  < > 8.4* 8.6* 8.4* 8.3* 9.5*  HCT 25.4* 27.1*  < > 28.1* 28.1* 28.1* 27.5* 31.2*  MCV 58.3* 60.4*  --   --   --  65.3*  --  68.0*  PLT 439* 419*  --   --   --  279  --  296  < > = values in this interval not displayed. Cardiac Enzymes:  Recent Labs Lab 09/08/12 0430 09/08/12 1215 09/08/12 1528  TROPONINI <0.30 <0.30 <0.30   BNP: BNP (last 3 results) No results found for this basename: PROBNP,  in the  last 8760 hours CBG:  Recent Labs Lab 09/10/12 1135 09/10/12 1338 09/10/12 1525 09/10/12 2357 09/11/12 0419  GLUCAP 178* 256* 196* 137* 138*       Signed:  OTI,CHRISTOPHER  [facility] 09/11/2012, 10:02 AM

## 2012-09-11 NOTE — Clinical Social Work Psychosocial (Signed)
     Clinical Social Work Department BRIEF PSYCHOSOCIAL ASSESSMENT 09/11/2012  Patient:  [name], [name]     Account Number:  [id number]     Admit date:  09/07/2012  Clinical Social Worker:  Margaree Mackintosh  Date/Time:  09/11/2012 11:10 AM  Referred by:  Physician  Date Referred:  09/11/2012 Referred for  SNF Placement   Other Referral:   Pt from [facility], ready for return today.   Interview type:  Patient Other interview type:    PSYCHOSOCIAL DATA Living Status:  FACILITY Admitted from facility:  [facility] Level of care:  Skilled Nursing Facility Primary support name:  Twilia Yaklin: [phone] Primary support relationship to patient:  CHILD, ADULT Degree of support available:   Unknown.    CURRENT CONCERNS Current Concerns  Post-Acute Placement   Other Concerns:    SOCIAL WORK ASSESSMENT / PLAN Clinical Social Worker recieved referral today indicating pt is from [facility] ready for return today.  CSW contacted [facility], pt is welcome to return.  CSW submitted apporpriate clinical information to SNF and [facility].  CSW left voicemail message with Selena Batten at [facility] [place].  CSW met with pt at bedside.  CSW intrdouced self, explained role, and provided support.  CSW provided emotional support.  Pt confirmed desire to return to [facility].  CSW to continue to follow and assist as needed.   Assessment/plan status:  Information/Referral to [facility] Other assessment/ plan:   Information/referral to community resources:   SNF.    PATIENTS/FAMILYS RESPONSE TO PLAN OF CARE: Pt was pleasant and engaged in conversation.  Pt thanked CSW for intervention.

## 2012-09-11 NOTE — Progress Notes (Signed)
Clinical Social Worker received notification from SNF that they have received insurance authorization.  CSW prepared dc packet and phoned transportation. CSW to sign off at dc, please re consult if needed.    Angelia Mould, MSW, [place] [phone]

## 2012-09-14 LAB — CULTURE, BLOOD (ROUTINE X 2): Culture: NO GROWTH

## 2012-09-28 ENCOUNTER — Non-Acute Institutional Stay (SKILLED_NURSING_FACILITY): Payer: Medicare Other | Admitting: Internal Medicine

## 2012-09-28 DIAGNOSIS — J189 Pneumonia, unspecified organism: Secondary | ICD-10-CM

## 2012-09-28 DIAGNOSIS — E119 Type 2 diabetes mellitus without complications: Secondary | ICD-10-CM

## 2012-09-28 DIAGNOSIS — I10 Essential (primary) hypertension: Secondary | ICD-10-CM

## 2012-09-28 DIAGNOSIS — D62 Acute posthemorrhagic anemia: Secondary | ICD-10-CM

## 2012-09-28 DIAGNOSIS — G35 Multiple sclerosis: Secondary | ICD-10-CM

## 2012-10-03 NOTE — Progress Notes (Signed)
Patient ID: [name], female   DOB: 1935-05-24, 78 y.o.   MRN: [id number]     PCP: Oneal Grout, MD   Allergies  Allergen Reactions  . Contrast Media (Iodinated Diagnostic Agents)     Unknown  . Penicillins     Unknown    Chief Complaint: readmit post hospitalization 09/07/12- 09/11/12  HPI:  History of present illness:   78 year old  female patient who is a long term care resident of [facility] was readmitted to the hospital on above date with symptomatic anemia. She has known history of HTN, dyslipidemia, DM-2, GERD, multiple sclerosis, quadriplegia, ulcerative colitis, anxiety among others. She had heme positive FOBT on rectal examination. Gi was consulted and she was transfused a total of 3 units PRBC during this hospitalization and her hb was 9.5 on discharge.EGD on 09/09/12 was negative. Colonoscopy revealed no obvious cause, except massive external hemorrhoids. Mucosa was endoscopically normal. She was started on b12 and iron supplements. Her chest xray revealed patchy vague left basilar airspace opacity, suggestive of developing infiltrate/pneumonia. She was also tachycardic and hypotensive and was thus treated for HCAP with iv Vancomycin/Cefepime/Levaquin initially and later transitioned to oral Levaquin until 09/17/12. Following the treatment patient improved and was sent back to SNF. She was seen in her room today. She denies any complaints. Her anxiety has been under control. No recent fever reported. Appetite is fair.  Review of Systems: Review of Systems  Constitutional: Negative for fever, chills and diaphoresis.  HENT: Negative for congestion.   Eyes: Negative for blurred vision.  Respiratory: Negative for cough and shortness of breath.   Cardiovascular: Negative for chest pain and palpitations.  Gastrointestinal: Negative for vomiting, abdominal pain and diarrhea.  Genitourinary: Negative for dysuria.  Musculoskeletal: Negative for falls.  Skin: Negative for rash.   Neurological: Negative for dizziness and headaches.  Psychiatric/Behavioral: Negative for depression. The patient is nervous/anxious.     Past Medical History  Diagnosis Date  . Hypertension   . Diabetes mellitus   . GERD (gastroesophageal reflux disease)   . Neuromuscular disorder   . Multiple sclerosis   . Anxiety   . Hyperlipemia   . Pneumonia   . Colitis   . Unable to ambulate    Past Surgical History  Procedure Laterality Date  . Tubal ligation    . Flexible sigmoidoscopy  10/05/2011    Procedure: FLEXIBLE SIGMOIDOSCOPY;  Surgeon: Vertell Novak., MD;  Location: [facility];  Service: Endoscopy;  Laterality: N/A;   pt. coming from [facility]-[phone] daughter phone no. [phone],daughter might come with her  sedation is needed, pt. is paraplegic  . Esophagogastroduodenoscopy N/A 09/09/2012    Procedure: ESOPHAGOGASTRODUODENOSCOPY (EGD);  Surgeon: Vertell Novak., MD;  Location: [facility] ENDOSCOPY;  Service: Endoscopy;  Laterality: N/A;  . Colonoscopy N/A 09/09/2012    Procedure: COLONOSCOPY;  Surgeon: Vertell Novak., MD;  Location: [facility] ENDOSCOPY;  Service: Endoscopy;  Laterality: N/A;  possible flex sig   Social History:   reports that she quit smoking about 16 years ago. She has never used smokeless tobacco. She reports that she does not drink alcohol or use illicit drugs.  Family History  Problem Relation Age of Onset  . CAD Mother   . Diabetes Daughter   . Diabetes Daughter   . Diabetes Son     Medications: Patient's Medications  New Prescriptions   No medications on file  Previous Medications   ASPIRIN 81 MG TABLET  Take 81 mg by mouth daily.   ATORVASTATIN (LIPITOR) 20 MG TABLET    Take 20 mg by mouth See admin instructions. Takes 1 tablet daily on mondays, Wednesdays, and fridays.   CHOLECALCIFEROL (VITAMIN D) 1000 UNITS TABLET    Take 2,000 Units by mouth 2 (two) times daily.   FERROUS SULFATE 325 (65 FE) MG TABLET    Take 1 tablet (325 mg  total) by mouth 2 (two) times daily with a meal.   GABAPENTIN (NEURONTIN) 100 MG CAPSULE    Take 100 mg by mouth 3 (three) times daily.   GLYBURIDE (DIABETA) 2.5 MG TABLET    Take 2.5 mg by mouth 2 (two) times daily with a meal.    HYDROXYZINE (ATARAX/VISTARIL) 25 MG TABLET    Take 25 mg by mouth 3 (three) times daily as needed for itching.   IPRATROPIUM (ATROVENT) 0.02 % NEBULIZER SOLUTION    Take 500 mcg by nebulization 3 (three) times daily. For 3 days starting 09/07/12   LEVOFLOXACIN (LEVAQUIN) 500 MG TABLET    Take 1 tablet (500 mg total) by mouth daily.   METFORMIN (GLUCOPHAGE-XR) 750 MG 24 HR TABLET    Take 750 mg by mouth 2 (two) times daily.    METOPROLOL TARTRATE (LOPRESSOR) 25 MG TABLET    Take 1 tablet (25 mg total) by mouth 2 (two) times daily.   PANTOPRAZOLE (PROTONIX) 40 MG TABLET    Take 40 mg by mouth daily.   POLYETHYLENE GLYCOL (MIRALAX / GLYCOLAX) PACKET    Take 17 g by mouth every other day.   POLYVINYL ALCOHOL (LIQUIFILM TEARS) 1.4 % OPHTHALMIC SOLUTION    Place 1 drop into both eyes 3 (three) times daily as needed (dry eyes).   SKIN PROTECTANTS, MISC. (DIMETHICONE-ZINC OXIDE) CREAM    Apply 1 application topically 2 (two) times daily as needed for dry skin.   SULFADIAZINE 500 MG TABLET    Take 1,000 mg by mouth 2 (two) times daily.   VITAMIN B-12 100 MCG TABLET    Take 1 tablet (100 mcg total) by mouth daily.  Modified Medications   No medications on file  Discontinued Medications   No medications on file     Physical Exam: bp 108/70, hr 84/min, temp 98.2, rr 18/min  General: Comfortable, alert, in no distress  HEENT: Mild clinical pallor, no icterus, PERRLA, EOMI NECK: Supple, JVP not seen, no carotid bruits, no palpable lymphadenopathy, no palpable goiter.   LUNGS: Clinically clear to auscultation, no wheezes, no crackles HEART: normal s1,s2, rrr ABDOMEN: Flat, soft, non-tender, no palpable organomegaly, no palpable masses, normal bowel sounds.   LOWER  EXTREMITIES: No pitting edema, palpable peripheral pulses.   MUSCULOSKELETAL SYSTEM: Generalized osteoarthritic changes, otherwise, normal.   CENTRAL NERVOUS SYSTEM: Quadriplegic. Alert and oriented   Labs reviewed: Basic Metabolic Panel:   Recent Labs Lab  09/08/12 0430  09/09/12 0603  09/09/12 0830  09/09/12 1957  09/10/12 0709  09/11/12 0400   NA  142  142   --   138  140  144   K  3.4*  3.4*   --   3.8  3.5  3.4*   CL  99  107   --   109  111  115*   CO2  29  26   --   22  22  21    GLUCOSE  117*  108*   --   254*  120*  146*   BUN  26*  8   --  5*  5*  8   CREATININE  0.38*  0.32*   --   0.30*  0.31*  0.40*   CALCIUM  9.6  8.8   --   8.2*  8.8  8.7   MG   --    --   1.1*  1.7  1.7   --     Liver Function Tests:   Recent Labs Lab  09/07/12 1634  09/09/12 1957  09/11/12 0400   AST  14  11  13    ALT  7  6  6    ALKPHOS  49  43  41   BILITOT  0.1*  0.1*  0.1*   PROT  7.8  5.7*  5.5*   ALBUMIN  3.9  2.5*  2.4*    No results found for this basename: LIPASE, AMYLASE,  in the last 168 hours No results found for this basename: AMMONIA,  in the last 168 hours CBC:   Recent Labs Lab  09/07/12 1634  09/08/12 0430    09/09/12 0603  09/09/12 1240  09/09/12 1957  09/10/12 0709  09/11/12 0400   WBC  6.9  8.5   --    --    --   6.7   --   8.4   NEUTROABS  3.7   --    --    --    --    --    --    --    HGB  7.0*  8.2*   < >  8.4*  8.6*  8.4*  8.3*  9.5*   HCT  25.4*  27.1*   < >  28.1*  28.1*  28.1*  27.5*  31.2*   MCV  58.3*  60.4*   --    --    --   65.3*   --   68.0*   PLT  439*  419*   --    --    --   279   --   296   < > = values in this interval not displayed. Cardiac Enzymes:   Recent Labs Lab  09/08/12 0430  09/08/12 1215  09/08/12 1528   TROPONINI  <0.30  <0.30  <0.30    BNP: BNP (last 3 results) No results found for this basename: PROBNP,  in the last 8760 hours CBG:   Recent Labs Lab  09/10/12 1135  09/10/12 1338  09/10/12 1525   09/10/12 2357  09/11/12 0419   GLUCAP  178*  256*  196*  137*  138*     Radiological Exams: Dg Chest Port 1 View  09/07/2012  *RADIOLOGY REPORT*  Clinical Data: Possible pneumonia.  Prior smoker.  Bronchitis.  PORTABLE CHEST - 1 VIEW  Comparison: 11/15/2012  Findings: Patchy opacity in the left lung base.  Right lung is clear.  Heart is normal size.  No effusions.  No acute bony abnormality.  IMPRESSION: Patchy vague left basilar airspace opacity.  This could represent developing infiltrate/pneumonia.   Original Report Authenticated By: Charlett Nose, M.D.    Procedures:  See Below.  EGD.  Colonoscopy.  Consultations:  Dr Carman Ching, gastroenterologist.  Assessment/Plan  anemia: s/p 3 u PRBC transfusion. Will check cbc. Continue iron supplement and b12 for now  HCAP: completed antibiotic course and currently asymptomatic. Monitor clinically  Multiple Sclerosis: patient is quadriplegic. Otherwise stable.    Diabetes Mellitus: continue metformin and glyburide.Continue asa and atorvastatin. Also to continue neurontin 100 mg tid  and monitor cbg. Check a1c, bmp  HTN: stable for now. Will continue lopressor with asa and statin  Ulcerative colitis: continue sulfadizaine current regimen for this chronic problem.   Labs/tests ordered- cbc, bmp, a1c

## 2012-10-31 ENCOUNTER — Non-Acute Institutional Stay (SKILLED_NURSING_FACILITY): Payer: Medicare Other | Admitting: Nurse Practitioner

## 2012-10-31 DIAGNOSIS — R05 Cough: Secondary | ICD-10-CM

## 2012-10-31 DIAGNOSIS — R0989 Other specified symptoms and signs involving the circulatory and respiratory systems: Secondary | ICD-10-CM

## 2012-10-31 DIAGNOSIS — K117 Disturbances of salivary secretion: Secondary | ICD-10-CM

## 2012-10-31 DIAGNOSIS — G35 Multiple sclerosis: Secondary | ICD-10-CM

## 2012-10-31 DIAGNOSIS — L708 Other acne: Secondary | ICD-10-CM

## 2012-10-31 DIAGNOSIS — R630 Anorexia: Secondary | ICD-10-CM

## 2012-10-31 DIAGNOSIS — R682 Dry mouth, unspecified: Secondary | ICD-10-CM

## 2012-10-31 DIAGNOSIS — R062 Wheezing: Secondary | ICD-10-CM

## 2012-10-31 DIAGNOSIS — L709 Acne, unspecified: Secondary | ICD-10-CM

## 2012-10-31 DIAGNOSIS — G8250 Quadriplegia, unspecified: Secondary | ICD-10-CM

## 2012-11-01 ENCOUNTER — Encounter: Payer: Self-pay | Admitting: Nurse Practitioner

## 2012-11-07 ENCOUNTER — Encounter: Payer: Self-pay | Admitting: Nurse Practitioner

## 2012-11-07 MED ORDER — IPRATROPIUM-ALBUTEROL 0.5-2.5 (3) MG/3ML IN SOLN: 3.0000 mL | Freq: Three times a day (TID) | RESPIRATORY_TRACT | Status: DC

## 2012-11-07 MED ORDER — LEVOFLOXACIN 500 MG PO TABS: 500.0000 mg | ORAL_TABLET | Freq: Every day | ORAL | Status: AC

## 2012-11-07 NOTE — Progress Notes (Signed)
  Subjective:    Patient ID: [name], female    DOB: 01-Feb-1935, 78 y.o.   MRN: [id number]  Cough The current episode started today. The problem has been gradually worsening. The problem occurs every few minutes. The cough is productive of sputum. Associated symptoms include wheezing. Pertinent negatives include no chest pain, chills, fever or shortness of breath. Nothing aggravates the symptoms. She has tried nothing for the symptoms. Her past medical history is significant for pneumonia.      Review of Systems  Constitutional: Negative for fever and chills.  HENT: Negative.   Eyes: Negative.   Respiratory: Positive for cough and wheezing. Negative for shortness of breath.   Cardiovascular: Negative for chest pain.  Gastrointestinal: Negative.   Endocrine: Negative.   Genitourinary: Negative.   Musculoskeletal:       Quadriplegia.  Skin: Negative.   Allergic/Immunologic: Negative.   Neurological: Negative.   Hematological: Negative.   Psychiatric/Behavioral: Negative.        Objective:   Physical Exam  Constitutional: She is oriented to person, place, and time. She appears well-developed and well-nourished. She has a sickly appearance.  HENT:  Mouth/Throat: Mucous membranes are pale and dry. No oropharyngeal exudate or posterior oropharyngeal erythema.  Eyes: Conjunctivae are normal. Pupils are equal, round, and reactive to light.  Pulmonary/Chest: She has decreased breath sounds in the right lower field and the left lower field. She has wheezes in the right upper field and the left upper field. She has rhonchi in the right upper field and the left upper field. She has rales in the right upper field and the left upper field.  Abdominal: Soft.  Musculoskeletal: She exhibits no edema.  Quadriplegia.  Neurological: She is alert and oriented to person, place, and time. She displays atrophy. She exhibits abnormal muscle tone.  Skin: Skin is warm and dry. She is not diaphoretic.   Psychiatric: She has a normal mood and affect.          Assessment & Plan:  Pt with Multiple Sclerosis and quadriplegia presents with productive cough. Has been afebrile. Due to quadriplegia pt is unable to effectively cough and deep breathe to expel sputum. She is high risk for re-hospitalization and respiratory failure.  1) Will order cxray for productive cough. 2) Rocephin 1gm x1 now, may mix with lidocaine. 3) Levaquin 500mg  po qd x 7 days. 4) Duoneb one tid x 7 days for wheezing 5) Solumedrol 125mg  IM x 1 now - sob/wheeze 6) Keep HOB up 45 degrees. Please perform chest physiotherapy on her q shift. I have spoken with Babs Sciara Physical Therapist about chest PT and they have agreed to see the pt for consult and eval. Therapy please inform nursing on proper positioning for chest PT in this quadriplegic pt, with technique and duration. 7) Suction oral secretions with yankauer prn. Pt is unable to manage secretions. 8) Tachycardia. Nurse held her metoprolol today for sbp of 90. HR is now 110. Will hold Metoprolol 25mg  bid today, give 12.5mg  po x 1 now and 12.5mg  at 8pm, hold for sbp < 110, hr < 60. 9) Will start Clysis 0.9% NS @ 65 cc/hr x 1 liter - dry mucous membranes, poor po intake.

## 2013-01-30 ENCOUNTER — Non-Acute Institutional Stay (SKILLED_NURSING_FACILITY): Payer: Medicare Other | Admitting: Adult Health

## 2013-01-30 DIAGNOSIS — G35D Multiple sclerosis, unspecified: Secondary | ICD-10-CM

## 2013-01-30 DIAGNOSIS — IMO0001 Reserved for inherently not codable concepts without codable children: Secondary | ICD-10-CM

## 2013-01-30 DIAGNOSIS — K5900 Constipation, unspecified: Secondary | ICD-10-CM

## 2013-01-30 DIAGNOSIS — F411 Generalized anxiety disorder: Secondary | ICD-10-CM

## 2013-01-30 DIAGNOSIS — D649 Anemia, unspecified: Secondary | ICD-10-CM

## 2013-01-30 DIAGNOSIS — J309 Allergic rhinitis, unspecified: Secondary | ICD-10-CM

## 2013-01-30 DIAGNOSIS — G35 Multiple sclerosis: Secondary | ICD-10-CM

## 2013-01-30 DIAGNOSIS — K5190 Ulcerative colitis, unspecified, without complications: Secondary | ICD-10-CM

## 2013-01-30 DIAGNOSIS — E785 Hyperlipidemia, unspecified: Secondary | ICD-10-CM

## 2013-01-30 DIAGNOSIS — I10 Essential (primary) hypertension: Secondary | ICD-10-CM

## 2013-01-30 DIAGNOSIS — K219 Gastro-esophageal reflux disease without esophagitis: Secondary | ICD-10-CM

## 2013-02-03 ENCOUNTER — Encounter: Payer: Self-pay | Admitting: Adult Health

## 2013-02-03 DIAGNOSIS — J309 Allergic rhinitis, unspecified: Secondary | ICD-10-CM | POA: Insufficient documentation

## 2013-02-03 DIAGNOSIS — F411 Generalized anxiety disorder: Secondary | ICD-10-CM | POA: Insufficient documentation

## 2013-02-03 DIAGNOSIS — K5190 Ulcerative colitis, unspecified, without complications: Secondary | ICD-10-CM | POA: Insufficient documentation

## 2013-02-03 DIAGNOSIS — K219 Gastro-esophageal reflux disease without esophagitis: Secondary | ICD-10-CM | POA: Insufficient documentation

## 2013-02-03 DIAGNOSIS — I10 Essential (primary) hypertension: Secondary | ICD-10-CM | POA: Insufficient documentation

## 2013-02-03 DIAGNOSIS — K5900 Constipation, unspecified: Secondary | ICD-10-CM | POA: Insufficient documentation

## 2013-02-03 DIAGNOSIS — IMO0001 Reserved for inherently not codable concepts without codable children: Secondary | ICD-10-CM

## 2013-02-03 DIAGNOSIS — E785 Hyperlipidemia, unspecified: Secondary | ICD-10-CM | POA: Insufficient documentation

## 2013-02-03 HISTORY — DX: Reserved for inherently not codable concepts without codable children: IMO0001

## 2013-02-03 HISTORY — DX: Ulcerative colitis, unspecified, without complications: K51.90

## 2013-02-03 NOTE — Progress Notes (Signed)
Patient ID: [name], female   DOB: May 28, 1935, 78 y.o.   MRN: [id number]  [facility] Allergies  Allergen Reactions  . Contrast Media (Iodinated Diagnostic Agents)     Unknown  . Penicillins     Unknown     Chief Complaint  Patient presents with  . Medical Managment of Chronic Issues    HPI: She is being seen for the management of her chronic illnesses. She is complaiing of a sore throat that she has had for the past couple of days. She denies any sinus symptoms. Has small amount of sputum production that this has not changed in color. There is no change in appetite present no reports of fever present.   Past Medical History  Diagnosis Date  . Hypertension   . Diabetes mellitus   . GERD (gastroesophageal reflux disease)   . Neuromuscular disorder   . Multiple sclerosis   . Anxiety   . Hyperlipemia   . Pneumonia   . Colitis   . Unable to ambulate     Past Surgical History  Procedure Laterality Date  . Tubal ligation    . Flexible sigmoidoscopy  10/05/2011    Procedure: FLEXIBLE SIGMOIDOSCOPY;  Surgeon: Vertell Novak., MD;  Location: [facility]OSCOPY;  Service: Endoscopy;  Laterality: N/A;   pt. coming from [phone] daughter phone no. [phone],daughter might come with her  sedation is needed, pt. is paraplegic  . Esophagogastroduodenoscopy N/A 09/09/2012    Procedure: ESOPHAGOGASTRODUODENOSCOPY (EGD);  Surgeon: Vertell Novak., MD;  Location: [facility] ENDOSCOPY;  Service: Endoscopy;  Laterality: N/A;  . Colonoscopy N/A 09/09/2012    Procedure: COLONOSCOPY;  Surgeon: Vertell Novak., MD;  Location: [facility] ENDOSCOPY;  Service: Endoscopy;  Laterality: N/A;  possible flex sig    VITAL SIGNS BP 101/62  Pulse 101  Ht 5\' 4"  (1.626 m)  Wt 123 lb (55.792 kg)  BMI 21.1 kg/m2   Patient's Medications  New Prescriptions   No medications on file  Previous Medications   ALPRAZOLAM (XANAX) 0.25 MG TABLET    Take 0.25 mg by mouth at bedtime as needed for  sleep.   ASPIRIN 81 MG TABLET    Take 81 mg by mouth daily.   ATORVASTATIN (LIPITOR) 20 MG TABLET    Take 20 mg by mouth See admin instructions. Takes 1 tablet daily on mondays, Wednesdays, and fridays.   CETIRIZINE (ZYRTEC) 10 MG TABLET    Take 10 mg by mouth daily.   CHOLECALCIFEROL (VITAMIN D) 1000 UNITS TABLET    Take 2,000 Units by mouth 2 (two) times daily.   FERROUS SULFATE 325 (65 FE) MG TABLET    Take 1 tablet (325 mg total) by mouth 2 (two) times daily with a meal.   GABAPENTIN (NEURONTIN) 100 MG CAPSULE    Take 100 mg by mouth 3 (three) times daily.   GLYBURIDE (DIABETA) 2.5 MG TABLET    Take 2.5 mg by mouth 2 (two) times daily with a meal.    METFORMIN (GLUCOPHAGE) 500 MG TABLET    Take 500 mg by mouth daily.   PANTOPRAZOLE (PROTONIX) 40 MG TABLET    Take 40 mg by mouth daily.   POLYETHYLENE GLYCOL (MIRALAX / GLYCOLAX) PACKET    Take 17 g by mouth every other day.   POLYVINYL ALCOHOL (LIQUIFILM TEARS) 1.4 % OPHTHALMIC SOLUTION    Place 1 drop into both eyes 3 (three) times daily as needed (dry eyes).   SKIN PROTECTANTS, MISC. (DIMETHICONE-ZINC OXIDE)  CREAM    Apply 1 application topically 2 (two) times daily as needed for dry skin.   SULFADIAZINE 500 MG TABLET    Take 1,000 mg by mouth 2 (two) times daily.   VITAMIN B-12 100 MCG TABLET    Take 1 tablet (100 mcg total) by mouth daily.  Modified Medications   Modified Medication Previous Medication   METOPROLOL TARTRATE (LOPRESSOR) 25 MG TABLET metoprolol tartrate (LOPRESSOR) 25 MG tablet      Take 25 mg by mouth 2 (two) times daily.    Take 1 tablet (25 mg total) by mouth 2 (two) times daily.  Discontinued Medications   HYDROXYZINE (ATARAX/VISTARIL) 25 MG TABLET    Take 25 mg by mouth 3 (three) times daily as needed for itching.   IPRATROPIUM-ALBUTEROL (DUONEB) 0.5-2.5 (3) MG/3ML SOLN    Take 3 mLs by nebulization 3 (three) times daily.    SIGNIFICANT DIAGNOSTIC EXAMS  10-31-12: chest x-ray: borderline cardiomegaly with change  with new minimal pulmonary vascular congestion no pleural effusion. Mild bilateral atelectasis appears worse on left and new on right. Co-existing interstitial pneumonitis cannot be excluded at lung bases. No inflammatory consolidate or nodule. Mild bronchitis at perihilar regions.   LABS REVIEWED:   09-11-12: wbc 8.4; hgb 9.5; hct 31.2; mcv 68.0; plt 296; glucose 146; bun 8; creat 0.40; k+3.4;na++144 Liver normal albumin 2.4 11-08-12: wbc 7.2; hgb 12.1; hct 36.0; mcv 78.4;plt 301; glucose 123; bun 14; creat 0.48; k+4.1na++143   Review of Systems  Constitutional: Negative for fever and malaise/fatigue.  HENT: Positive for sore throat. Negative for congestion.   Respiratory: Positive for sputum production. Negative for cough and shortness of breath.   Cardiovascular: Negative for chest pain.  Gastrointestinal: Negative for heartburn and abdominal pain.  Musculoskeletal: Negative for myalgias.  Skin: Negative.   Neurological: Negative for headaches.  Psychiatric/Behavioral: Negative for depression. The patient does not have insomnia.    Physical Exam  Constitutional: She is oriented to person, place, and time.  thin  Neck: No tracheal deviation present. No thyromegaly present.  Cardiovascular: Normal rate, regular rhythm and intact distal pulses.   Respiratory: Effort normal and breath sounds normal. No respiratory distress.  GI: Soft. Bowel sounds are normal. She exhibits no distension. There is no tenderness.  Musculoskeletal: She exhibits no edema.  Is unable to move any extremity is presently in bed  Lymphadenopathy:    She has no cervical adenopathy.  Neurological: She is alert and oriented to person, place, and time.  Skin: Skin is warm and dry.      ASSESSMENT/ PLAN:  MS (multiple sclerosis) Her status is unchanged; she has no movement in any extremities; she is taking neurontin 100 mg three times daily for peripheral neuropathy; will continue current plan of care and will  monitor her status   Anemia Her anemia is stable will continue iron twice daily and will monitor her status   Ulcerative colitis, unspecified She is stable will continue azulfidine 1 gm twice daily and will monitor  GERD (gastroesophageal reflux disease) Is stable will continue protonix 40 mg daily   Unspecified constipation Is stable will continue miralax every other day   Essential hypertension, benign Is stable will continue lopressor 25 mg twice daily; asa 81 mg daily  and will monitor   Dyslipidemia Will continue lipitor three days weekly   Type II or unspecified type diabetes mellitus without mention of complication, uncontrolled Will continue glyburide 2.5 mg twice daily and metformin xr 500 mg daily  Allergic rhinitis She has a sore throat; will continue her zyrtec 10 mg daily will add nasonex 2 puffs nightly for 2 weeks then daily as needed   Anxiety state, unspecified Will continue her xanax 0.25 mg nightly as needed      Time spent with patient 50 minutes.

## 2013-02-03 NOTE — Assessment & Plan Note (Addendum)
Is stable will continue lopressor 25 mg twice daily; asa 81 mg daily  and will monitor

## 2013-02-03 NOTE — Assessment & Plan Note (Signed)
She has a sore throat; will continue her zyrtec 10 mg daily will add nasonex 2 puffs nightly for 2 weeks then daily as needed

## 2013-02-03 NOTE — Assessment & Plan Note (Addendum)
Her status is unchanged; she has no movement in any extremities; she is taking neurontin 100 mg three times daily for peripheral neuropathy; will continue current plan of care and will monitor her status

## 2013-02-03 NOTE — Assessment & Plan Note (Signed)
Will continue her xanax 0.25 mg nightly as needed

## 2013-02-03 NOTE — Assessment & Plan Note (Signed)
Will continue glyburide 2.5 mg twice daily and metformin xr 500 mg daily

## 2013-02-03 NOTE — Assessment & Plan Note (Signed)
Is stable will continue protonix 40 mg daily  

## 2013-02-03 NOTE — Assessment & Plan Note (Signed)
Will continue lipitor three days weekly

## 2013-02-03 NOTE — Assessment & Plan Note (Signed)
She is stable will continue azulfidine 1 gm twice daily and will monitor

## 2013-02-03 NOTE — Assessment & Plan Note (Signed)
Is stable will continue miralax every other day

## 2013-02-03 NOTE — Assessment & Plan Note (Signed)
Her anemia is stable will continue iron twice daily and will monitor her status

## 2013-02-05 ENCOUNTER — Non-Acute Institutional Stay (SKILLED_NURSING_FACILITY): Payer: Medicare Other | Admitting: Adult Health

## 2013-02-05 DIAGNOSIS — J069 Acute upper respiratory infection, unspecified: Secondary | ICD-10-CM

## 2013-02-18 ENCOUNTER — Encounter: Payer: Self-pay | Admitting: Adult Health

## 2013-02-18 DIAGNOSIS — J069 Acute upper respiratory infection, unspecified: Secondary | ICD-10-CM | POA: Insufficient documentation

## 2013-02-18 NOTE — Progress Notes (Signed)
Patient ID: [name], female   DOB: 07-12-1934, 78 y.o.   MRN: [id number]  [facility]  Allergies  Allergen Reactions  . Contrast Media (Iodinated Diagnostic Agents)     Unknown  . Penicillins     Unknown     Chief Complaint  Patient presents with  . Acute Visit    sore throat    HPI: She continues to have a sore throat her sputum production is more yellow in color; she is denying a cough; but staff reports that she has an occasional cough present. There are no reports of fever present no change in appetite; she states that her throat pain is disrupting her sleep.    Past Medical History  Diagnosis Date  . Hypertension   . Diabetes mellitus   . GERD (gastroesophageal reflux disease)   . Neuromuscular disorder   . Multiple sclerosis   . Anxiety   . Hyperlipemia   . Pneumonia   . Colitis   . Unable to ambulate     Past Surgical History  Procedure Laterality Date  . Tubal ligation    . Flexible sigmoidoscopy  10/05/2011    Procedure: FLEXIBLE SIGMOIDOSCOPY;  Surgeon: Vertell Novak., MD;  Location: [facility];  Service: Endoscopy;  Laterality: N/A;   pt. coming from [facility]-[phone] daughter phone no. [phone],daughter might come with her  sedation is needed, pt. is paraplegic  . Esophagogastroduodenoscopy N/A 09/09/2012    Procedure: ESOPHAGOGASTRODUODENOSCOPY (EGD);  Surgeon: Vertell Novak., MD;  Location: [facility] ENDOSCOPY;  Service: Endoscopy;  Laterality: N/A;  . Colonoscopy N/A 09/09/2012    Procedure: COLONOSCOPY;  Surgeon: Vertell Novak., MD;  Location: [facility] ENDOSCOPY;  Service: Endoscopy;  Laterality: N/A;  possible flex sig    VITAL SIGNS BP 100/62  Pulse 90  Ht 5\' 3"  (1.6 m)  Wt 123 lb (55.792 kg)  BMI 21.79 kg/m2   Patient's Medications  New Prescriptions   No medications on file  Previous Medications   ALPRAZOLAM (XANAX) 0.25 MG TABLET    Take 0.25 mg by mouth at bedtime as needed for sleep.   ASPIRIN 81 MG TABLET    Take 81  mg by mouth daily.   ATORVASTATIN (LIPITOR) 20 MG TABLET    Take 20 mg by mouth See admin instructions. Takes 1 tablet daily on mondays, Wednesdays, and fridays.   CETIRIZINE (ZYRTEC) 10 MG TABLET    Take 10 mg by mouth daily.   CHOLECALCIFEROL (VITAMIN D) 1000 UNITS TABLET    Take 2,000 Units by mouth 2 (two) times daily.   FERROUS SULFATE 325 (65 FE) MG TABLET    Take 1 tablet (325 mg total) by mouth 2 (two) times daily with a meal.   GABAPENTIN (NEURONTIN) 100 MG CAPSULE    Take 100 mg by mouth 3 (three) times daily.   GLYBURIDE (DIABETA) 2.5 MG TABLET    Take 2.5 mg by mouth 2 (two) times daily with a meal.    METFORMIN (GLUCOPHAGE) 500 MG TABLET    Take 500 mg by mouth daily.   METOPROLOL TARTRATE (LOPRESSOR) 25 MG TABLET    Take 25 mg by mouth 2 (two) times daily.   PANTOPRAZOLE (PROTONIX) 40 MG TABLET    Take 40 mg by mouth daily.   POLYETHYLENE GLYCOL (MIRALAX / GLYCOLAX) PACKET    Take 17 g by mouth every other day.   POLYVINYL ALCOHOL (LIQUIFILM TEARS) 1.4 % OPHTHALMIC SOLUTION    Place 1 drop into  both eyes 3 (three) times daily as needed (dry eyes).   SKIN PROTECTANTS, MISC. (DIMETHICONE-ZINC OXIDE) CREAM    Apply 1 application topically 2 (two) times daily as needed for dry skin.   SULFADIAZINE 500 MG TABLET    Take 1,000 mg by mouth 2 (two) times daily.   VITAMIN B-12 100 MCG TABLET    Take 1 tablet (100 mcg total) by mouth daily.  Modified Medications   No medications on file  Discontinued Medications   No medications on file    SIGNIFICANT DIAGNOSTIC EXAMS   10-31-12: chest x-ray: borderline cardiomegaly with change with new minimal pulmonary vascular congestion no pleural effusion. Mild bilateral atelectasis appears worse on left and new on right. Co-existing interstitial pneumonitis cannot be excluded at lung bases. No inflammatory consolidate or nodule. Mild bronchitis at perihilar regions.   LABS REVIEWED:   09-11-12: wbc 8.4; hgb 9.5; hct 31.2; mcv 68.0; plt 296; glucose  146; bun 8; creat 0.40; k+3.4;na++144 Liver normal albumin 2.4 11-08-12: wbc 7.2; hgb 12.1; hct 36.0; mcv 78.4;plt 301; glucose 123; bun 14; creat 0.48; k+4.1na++143   Review of Systems  Constitutional: Negative for fever and malaise/fatigue.  HENT: Positive for sore throat. Negative for congestion.   Respiratory: Positive for sputum production. Negative for cough and shortness of breath.   Cardiovascular: Negative for chest pain.  Gastrointestinal: Negative for heartburn and abdominal pain.  Musculoskeletal: Negative for myalgias.  Skin: Negative.   Neurological: Negative for headaches.  Psychiatric/Behavioral: Negative for depression. The patient does not have insomnia.    Physical Exam  Constitutional: She is oriented to person, place, and time.  thin has facial tenderness present throat is red and inflamed.  Neck: No tracheal deviation present. No thyromegaly present.  Cardiovascular: Normal rate, regular rhythm and intact distal pulses.   Respiratory: Effort normal and breath sounds normal. No respiratory distress.  GI: Soft. Bowel sounds are normal. She exhibits no distension. There is no tenderness.  Musculoskeletal: She exhibits no edema.  Is unable to move any extremity is presently in bed  Lymphadenopathy:    She has no cervical adenopathy.  Neurological: She is alert and oriented to person, place, and time.  Skin: Skin is warm and dry.     .ROS  .PHYSEXAM    ASSESSMENT/ PLAN:  URI (upper respiratory infection) Her status is slightly worse; due to her history and high risk for pneumonia will begin zithromax 500 mg daily for 7 days and florastor twice daily for 7 days and will monitor her status

## 2013-02-18 NOTE — Assessment & Plan Note (Signed)
Her status is slightly worse; due to her history and high risk for pneumonia will begin zithromax 500 mg daily for 7 days and florastor twice daily for 7 days and will monitor her status

## 2013-03-04 NOTE — Progress Notes (Signed)
This encounter was created in error - please disregard.

## 2013-03-28 ENCOUNTER — Encounter: Payer: Self-pay | Admitting: Internal Medicine

## 2013-03-28 ENCOUNTER — Non-Acute Institutional Stay (SKILLED_NURSING_FACILITY): Payer: Medicare Other | Admitting: Nurse Practitioner

## 2013-03-28 DIAGNOSIS — G8250 Quadriplegia, unspecified: Secondary | ICD-10-CM

## 2013-03-28 DIAGNOSIS — I10 Essential (primary) hypertension: Secondary | ICD-10-CM

## 2013-03-28 DIAGNOSIS — G35 Multiple sclerosis: Secondary | ICD-10-CM

## 2013-03-28 DIAGNOSIS — D649 Anemia, unspecified: Secondary | ICD-10-CM

## 2013-05-30 NOTE — Progress Notes (Signed)
This encounter was created in error - please disregard.

## 2013-06-24 NOTE — Progress Notes (Signed)
Date of Visit:  03/28/2013  Malvin Johns, Rm 301A  CODE STATUS: Full code  Patient ID: [name], female   DOB: 01-25-1935, 78 y.o.   MRN: [id number]  Chief Complaint  Patient presents with  . Medical Managment of Chronic Issues   Allergies  Allergen Reactions  . Contrast Media [Iodinated Diagnostic Agents]     Unknown  . Penicillins     Unknown   Current Outpatient Prescriptions on File Prior to Visit  Medication Sig Dispense Refill  . ALPRAZolam (XANAX) 0.25 MG tablet Take 0.25 mg by mouth at bedtime as needed for sleep.      Marland Kitchen aspirin 81 MG tablet Take 81 mg by mouth daily.      Marland Kitchen atorvastatin (LIPITOR) 20 MG tablet Take 20 mg by mouth See admin instructions. Takes 1 tablet daily on mondays, Wednesdays, and fridays.      . cetirizine (ZYRTEC) 10 MG tablet Take 10 mg by mouth daily.      . cholecalciferol (VITAMIN D) 1000 UNITS tablet Take 2,000 Units by mouth 2 (two) times daily.      . ferrous sulfate 325 (65 FE) MG tablet Take 1 tablet (325 mg total) by mouth 2 (two) times daily with a meal.  60 tablet  0  . gabapentin (NEURONTIN) 100 MG capsule Take 100 mg by mouth 3 (three) times daily.      Marland Kitchen glyBURIDE (DIABETA) 2.5 MG tablet Take 2.5 mg by mouth 2 (two) times daily with a meal.       . metFORMIN (GLUCOPHAGE) 500 MG tablet Take 500 mg by mouth daily.      . metoprolol tartrate (LOPRESSOR) 25 MG tablet Take 25 mg by mouth 2 (two) times daily.      . pantoprazole (PROTONIX) 40 MG tablet Take 40 mg by mouth daily.      . polyethylene glycol (MIRALAX / GLYCOLAX) packet Take 17 g by mouth every other day.      . polyvinyl alcohol (LIQUIFILM TEARS) 1.4 % ophthalmic solution Place 1 drop into both eyes 3 (three) times daily as needed (dry eyes).      . Skin Protectants, Misc. (DIMETHICONE-ZINC OXIDE) cream Apply 1 application topically 2 (two) times daily as needed for dry skin.      Marland Kitchen sulfaDIAZINE 500 MG tablet Take 1,000 mg by mouth 2 (two) times daily.      . vitamin  B-12 100 MCG tablet Take 1 tablet (100 mcg total) by mouth daily.  30 tablet  0   No current facility-administered medications on file prior to visit.   Past Medical History  Diagnosis Date  . Hypertension   . Diabetes mellitus   . GERD (gastroesophageal reflux disease)   . Neuromuscular disorder   . Multiple sclerosis   . Anxiety   . Hyperlipemia   . Pneumonia   . Colitis   . Unable to ambulate    Past Surgical History  Procedure Laterality Date  . Tubal ligation    . Flexible sigmoidoscopy  10/05/2011    Procedure: FLEXIBLE SIGMOIDOSCOPY;  Surgeon: Vertell Novak., MD;  Location: [facility];  Service: Endoscopy;  Laterality: N/A;   pt. coming from [facility]-[phone] daughter phone no. [phone],daughter might come with her  sedation is needed, pt. is paraplegic  . Esophagogastroduodenoscopy N/A 09/09/2012    Procedure: ESOPHAGOGASTRODUODENOSCOPY (EGD);  Surgeon: Vertell Novak., MD;  Location: [facility] ENDOSCOPY;  Service: Endoscopy;  Laterality: N/A;  . Colonoscopy  N/A 09/09/2012    Procedure: COLONOSCOPY;  Surgeon: Vertell Novak., MD;  Location: [facility] ENDOSCOPY;  Service: Endoscopy;  Laterality: N/A;  possible flex sig   HPI:  Here today to assess the patient, and review ongoing plan for management of her chronic medical issues.   Review of Systems  Constitutional: Negative for fever, chills and diaphoresis.  HENT: Negative for ear discharge, ear pain, hearing loss and nosebleeds.   Eyes: Negative for pain, discharge and redness.  Respiratory: Negative for hemoptysis and shortness of breath.        No reported concerns from the staff  Cardiovascular: Positive for leg swelling. Negative for chest pain and claudication.  Gastrointestinal: Negative for vomiting, diarrhea and blood in stool.  Genitourinary: Negative for hematuria and flank pain.  Musculoskeletal: Negative for back pain, falls and joint pain.  Skin: Positive for rash.       At the perineal area  and buttocks  Neurological: Positive for focal weakness and weakness. Negative for seizures and loss of consciousness.       The patient is quadriplegic  Endo/Heme/Allergies: Negative for environmental allergies. Does not bruise/bleed easily.  Psychiatric/Behavioral: Negative for suicidal ideas, hallucinations and substance abuse.   Recent labs from 03/13/2013  WBC 6.5 RBC 4.9 Hemoglobin 14.2 Hematocrit 44.6 MCV 90.8 MC H. 28.9 MCHC 31.8 RDW 15.5 Platelets 221 Neutrophil percentage 51.3 Neutrophil #3.4 Lymphocyte percentage 38.2 Lymphocyte #2.5  Hemoglobin A1c 5.9  Sodium 140 Potassium 4.1 Chloride 104 CO2 27 AGAP 9 Glucose 113 BUN 20 Creatinine 0.4 BUN/CR 47 Calcium 10.1  Lipid profile Cholesterol 150 HDL 32 LDL 78 VLDL 40.1 Triglycerides 200 CH/HDL 4.7  Vitamin B12 710  Vitamin D. 64.1  BP 104/64  Pulse 98  Resp 20  Physical Exam  Constitutional: She is oriented to person, place, and time. She appears well-developed and well-nourished. No distress.  Patient is lying in bed, has a special adapter for her call bell, which she can't initiate by blowing into a straw.  HENT:  Head: Normocephalic and atraumatic.  Right Ear: External ear normal.  Mouth/Throat: Oropharynx is clear and moist. No oropharyngeal exudate.  Eyes: Conjunctivae and EOM are normal. Pupils are equal, round, and reactive to light. Right eye exhibits no discharge. Left eye exhibits no discharge. No scleral icterus.  Neck: No thyromegaly present.  Patient has essentially no movement from the neck down  Cardiovascular: Normal rate, regular rhythm and intact distal pulses.   Pulmonary/Chest:  Bilateral breath sounds are mildly diminished in the bases but otherwise no rales, wheezes, or rhonchi.  Abdominal: Soft. Bowel sounds are normal. There is no tenderness.   Slight tenderness at the left lower cord or  Musculoskeletal: She exhibits edema.  Lymphadenopathy:    She has no cervical  adenopathy.  Neurological: She is alert and oriented to person, place, and time.  The patient is quadriplegic and has no movement or sensation from the neck down.  Skin: Skin is warm and dry. Rash noted. She is not diaphoretic. No cyanosis. Nails show clubbing.     The perineal rash is significantly improved it has been treated with antifungal and moisture protection. There is some mild erythema at the sacral area but, there is absolutely no evidence of any skin breakdown. The rash, consistent with the perineal rash, is significantly improved.  Psychiatric: She has a normal mood and affect. Her behavior is normal.    Impression/plan Skin, particularly at the buttocks and perineal area, will continue a dimethicone/zinc oxide  cream, and maintain a constant layer. Will have staff assist with position changes and continue monitoring her skin very carefully. The patient is on an air mattress. Anemia, would certainly be appropriate to discontinue ferrous sulfate at 325 mg, twice a day For her GERD and GI protection we'll certainly continue her Protonix at 40 mg a day For her lipid metabolism disorder we'll certainly continue the Lipitor 20 mg, in the evening. Hypertension we'll continue her metoprolol 25 mg twice a day. Regarding her multiple sclerosis, will continue the Neurontin 100 mg 3 times a day Diabetes, under excellent control we'll continue the metformin 500 mg once a day and DiaBeta 2.5 mg twice a day. The patient has had no reported episodes of hypoglycemia, if her A1c continues to be maintained at such a good level, may consider decreasing her diabetes medicine. For DVT prophylaxis we'll continue the aspirin 81 mg. Vitamin D supplementation we'll continue at 2000 units per day. For insomnia we'll continue her Xanax at 0.25 at bedtime. For prevention of constipation we'll certainly continue the MiraLax 17 g once a day. For dry eyes, will continue the Liquifilm tears, 1.4%, opth  solution We will continue the 6 month lab protocol, unless otherwise indicated.

## 2013-07-09 ENCOUNTER — Other Ambulatory Visit: Payer: Self-pay

## 2013-07-09 LAB — RAPID INFLUENZA A&B ANTIGENS

## 2013-07-10 ENCOUNTER — Non-Acute Institutional Stay (SKILLED_NURSING_FACILITY): Payer: Medicare Other | Admitting: Adult Health

## 2013-07-10 DIAGNOSIS — K219 Gastro-esophageal reflux disease without esophagitis: Secondary | ICD-10-CM

## 2013-07-10 DIAGNOSIS — J309 Allergic rhinitis, unspecified: Secondary | ICD-10-CM

## 2013-07-10 DIAGNOSIS — D649 Anemia, unspecified: Secondary | ICD-10-CM

## 2013-07-10 DIAGNOSIS — H04129 Dry eye syndrome of unspecified lacrimal gland: Secondary | ICD-10-CM

## 2013-07-10 DIAGNOSIS — K5900 Constipation, unspecified: Secondary | ICD-10-CM

## 2013-07-10 DIAGNOSIS — G8250 Quadriplegia, unspecified: Secondary | ICD-10-CM

## 2013-07-10 DIAGNOSIS — H04123 Dry eye syndrome of bilateral lacrimal glands: Secondary | ICD-10-CM

## 2013-07-10 DIAGNOSIS — R51 Headache: Secondary | ICD-10-CM

## 2013-07-10 DIAGNOSIS — E785 Hyperlipidemia, unspecified: Secondary | ICD-10-CM

## 2013-07-10 DIAGNOSIS — F411 Generalized anxiety disorder: Secondary | ICD-10-CM

## 2013-07-10 DIAGNOSIS — G35 Multiple sclerosis: Secondary | ICD-10-CM

## 2013-07-10 DIAGNOSIS — E559 Vitamin D deficiency, unspecified: Secondary | ICD-10-CM

## 2013-07-11 ENCOUNTER — Encounter: Payer: Self-pay | Admitting: Adult Health

## 2013-07-11 DIAGNOSIS — E559 Vitamin D deficiency, unspecified: Secondary | ICD-10-CM | POA: Insufficient documentation

## 2013-07-11 DIAGNOSIS — H04123 Dry eye syndrome of bilateral lacrimal glands: Secondary | ICD-10-CM | POA: Insufficient documentation

## 2013-07-11 DIAGNOSIS — R519 Headache, unspecified: Secondary | ICD-10-CM | POA: Insufficient documentation

## 2013-07-11 DIAGNOSIS — R51 Headache: Secondary | ICD-10-CM | POA: Insufficient documentation

## 2013-07-11 MED ORDER — FERROUS SULFATE 325 (65 FE) MG PO TABS: 325.0000 mg | ORAL_TABLET | Freq: Every day | ORAL | Status: DC

## 2013-07-11 MED ORDER — POLYETHYLENE GLYCOL 3350 17 G PO PACK: 17.0000 g | PACK | Freq: Two times a day (BID) | ORAL | Status: DC

## 2013-07-11 MED ORDER — IBUPROFEN 800 MG PO TABS
800.0000 mg | ORAL_TABLET | Freq: Four times a day (QID) | ORAL | Status: DC | PRN
Start: 2013-07-11 — End: 2015-05-19

## 2013-07-11 NOTE — Progress Notes (Signed)
Patient ID: [name], female   DOB: May 21, 1935, 79 y.o.   MRN: [id number]     [facility]  Allergies  Allergen Reactions  . Contrast Media [Iodinated Diagnostic Agents]     Unknown  . Penicillins     Unknown     Chief Complaint  Patient presents with  . Medical Managment of Chronic Issues    HPI:  She is being seen for the management of her chronic illnesses. She had a low grade fever with body aches; and nausea. She states that she is now feeling better; her rapid flu came back negative. She is able to eat without issues. She is complaining of constipation and dry eyes.  She is not having any other complaints.    Past Medical History  Diagnosis Date  . Hypertension   . Diabetes mellitus   . GERD (gastroesophageal reflux disease)   . Neuromuscular disorder   . Multiple sclerosis   . Anxiety   . Hyperlipemia   . Pneumonia   . Colitis   . Unable to ambulate     Past Surgical History  Procedure Laterality Date  . Tubal ligation    . Flexible sigmoidoscopy  10/05/2011    Procedure: FLEXIBLE SIGMOIDOSCOPY;  Surgeon: Winfield Cunas., MD;  Location: [facility] ENDOSCOPY;  Service: Endoscopy;  Laterality: N/A;   pt. coming from [place] daughter phone no. [phone] might come with her  sedation is needed, pt. is paraplegic  . Esophagogastroduodenoscopy N/A 09/09/2012    Procedure: ESOPHAGOGASTRODUODENOSCOPY (EGD);  Surgeon: Winfield Cunas., MD;  Location: [facility] ENDOSCOPY;  Service: Endoscopy;  Laterality: N/A;  . Colonoscopy N/A 09/09/2012    Procedure: COLONOSCOPY;  Surgeon: Winfield Cunas., MD;  Location: [facility] ENDOSCOPY;  Service: Endoscopy;  Laterality: N/A;  possible flex sig    VITAL SIGNS BP 137/87  Pulse 90  Ht 5\' 4"  (1.626 m)  Wt 131 lb (59.421 kg)  BMI 22.47 kg/m2   Patient's Medications  New Prescriptions   No medications on file  Previous Medications   ALPRAZOLAM (XANAX) 0.25 MG TABLET    Take 0.25 mg by mouth at bedtime as  needed for sleep.   ASPIRIN 81 MG TABLET    Take 81 mg by mouth daily.   ATORVASTATIN (LIPITOR) 20 MG TABLET    Take 20 mg by mouth See admin instructions. Takes 1 tablet daily on mondays, Wednesdays, and fridays.   CETIRIZINE (ZYRTEC) 10 MG TABLET    Take 10 mg by mouth daily.   CHOLECALCIFEROL (VITAMIN D) 1000 UNITS TABLET    Take 2,000 Units by mouth 2 (two) times daily.   FERROUS SULFATE 325 (65 FE) MG TABLET    Take 1 tablet (325 mg total) by mouth 2 (two) times daily with a meal.   GABAPENTIN (NEURONTIN) 100 MG CAPSULE    Take 100 mg by mouth 3 (three) times daily.   GLYBURIDE (DIABETA) 2.5 MG TABLET    Take 2.5 mg by mouth 2 (two) times daily with a meal.    METFORMIN (GLUCOPHAGE) 500 MG TABLET    Take 500 mg by mouth daily.   METOPROLOL TARTRATE (LOPRESSOR) 25 MG TABLET    Take 25 mg by mouth 2 (two) times daily.   PANTOPRAZOLE (PROTONIX) 40 MG TABLET    Take 40 mg by mouth daily.   POLYETHYLENE GLYCOL (MIRALAX / GLYCOLAX) PACKET    Take 17 g by mouth daily.    POLYVINYL ALCOHOL (LIQUIFILM TEARS) 1.4 %  OPHTHALMIC SOLUTION    Place 1 drop into both eyes 3 (three) times daily as needed (dry eyes).   SKIN PROTECTANTS, MISC. (DIMETHICONE-ZINC OXIDE) CREAM    Apply 1 application topically 2 (two) times daily as needed for dry skin.   SULFADIAZINE 500 MG TABLET    Take 1,000 mg by mouth 2 (two) times daily.   VITAMIN B-12 100 MCG TABLET    Take 1 tablet (100 mcg total) by mouth daily.  Modified Medications   No medications on file  Discontinued Medications   No medications on file    SIGNIFICANT DIAGNOSTIC EXAMS   10-31-12: chest x-ray: borderline cardiomegaly with change with new minimal pulmonary vascular congestion no pleural effusion. Mild bilateral atelectasis appears worse on left and new on right. Co-existing interstitial pneumonitis cannot be excluded at lung bases. No inflammatory consolidate or nodule. Mild bronchitis at perihilar regions.   LABS REVIEWED:   09-11-12: wbc 8.4;  hgb 9.5; hct 31.2; mcv 68.0; plt 296; glucose 146; bun 8; creat 0.40; k+3.4;na++144 Liver normal albumin 2.4 11-08-12: wbc 7.2; hgb 12.1; hct 36.0; mcv 78.4;plt 301; glucose 123; bun 14; creat 0.48; k+4.1na++143 03-13-13: wbc 6.5; hgb 14.2; hct 44.6 ;mcv 90.8 plt 221; glucose 113; bun 20; creat 0.4; k+4.1; na++140 chol 150; ldl 78; trig 200; hgb a1c 5.9; vit b12: 710; vit d 64.10  06-14-13: u/a: neg 07-09-13:rapid flu: neg    Review of Systems  Constitutional: Negative for malaise/fatigue.  Eyes: Negative for blurred vision.  Respiratory: Negative for cough, shortness of breath and wheezing.   Cardiovascular: Negative for chest pain, palpitations and leg swelling.  Gastrointestinal: Positive for constipation. Negative for heartburn, nausea and abdominal pain.  Genitourinary: Negative for dysuria.  Musculoskeletal: Negative for joint pain and myalgias.  Skin: Negative.   Neurological: Positive for headaches.  Psychiatric/Behavioral: Negative for depression. The patient is not nervous/anxious.      Physical Exam  Constitutional: She is oriented to person, place, and time.  thin has facial tenderness present throat is red and inflamed.  Neck: No tracheal deviation present. No thyromegaly present.  Cardiovascular: Normal rate, regular rhythm and intact distal pulses.   Respiratory: Effort normal and breath sounds normal. No respiratory distress.  GI: Soft. Bowel sounds are normal. She exhibits no distension. There is no tenderness.  Musculoskeletal: She exhibits no edema.  Is unable to move any extremity is presently in bed  Lymphadenopathy:    She has no cervical adenopathy.  Neurological: She is alert and oriented to person, place, and time.  Skin: Skin is warm and dry.        ASSESSMENT/ PLAN:  MS (multiple sclerosis) Her status is unchanged; she has no movement in any extremities; she is taking neurontin 100 mg three times daily for peripheral neuropathy; will continue current  plan of care and will monitor her status   Anemia Her anemia is stable will lower her iron to one time daily and will monitor   Ulcerative colitis, unspecified She is stable will continue azulfidine 1 gm twice daily and will monitor  GERD (gastroesophageal reflux disease) Is stable will continue protonix 40 mg daily   Unspecified constipation Is worse will increase miralax to twice daily and will monitor   Essential hypertension, benign Is stable will continue lopressor 25 mg twice daily; asa 81 mg daily  and will monitor   Dyslipidemia Is stable will continue lipitor 20 mg three times weekly    Type II or unspecified type diabetes mellitus without mention  of complication, uncontrolled Will stop glyburide 2.5 mg twice daily and will continue metformin xr 500 mg daily  Allergic rhinitis She  will continue her zyrtec 10 mg daily   Anxiety state, unspecified Will continue her xanax 0.25 mg nightly as needed   Vit d deficiency Will stop her vit d at this time   Dry eyes  Will change her liquid tears to three times daily routinely and will monitor   Headache  More than likely these are migraine in nature due to her light; sound sensitivity with nausea. Will begin motrin 800 mg every 6 hours as needed for headache and she may use an ice pack to her head as needed for headache as well.    Will check hgb a1c and cbc at the end of Jan 2015.

## 2013-07-15 ENCOUNTER — Non-Acute Institutional Stay (SKILLED_NURSING_FACILITY): Payer: Medicare Other | Admitting: Adult Health

## 2013-07-15 ENCOUNTER — Encounter: Payer: Self-pay | Admitting: Adult Health

## 2013-07-15 DIAGNOSIS — J209 Acute bronchitis, unspecified: Secondary | ICD-10-CM

## 2013-07-15 DIAGNOSIS — N390 Urinary tract infection, site not specified: Secondary | ICD-10-CM

## 2013-07-15 NOTE — Progress Notes (Signed)
Patient ID: [name], female   DOB: September 14, 1934, 79 y.o.   MRN: [id number]     [facility]  Allergies  Allergen Reactions  . Contrast Media [Iodinated Diagnostic Agents]     Unknown  . Penicillins     Unknown     Chief Complaint  Patient presents with  . Acute Visit    follow up x-ray and lab results     HPI:  She has had a fever; low grade a chest x-ray and u/a c&s was obtained; the results were bronchitis and uti. She is presently being treated with levaquin for a total of 10 days. She states her ear ache is improving that she was told she has ear wax build up and is presently on debrox ear drops. She states that she is feeling better.  We did discuss her medication changes of vit d and iron.   Past Medical History  Diagnosis Date  . Hypertension   . Diabetes mellitus   . GERD (gastroesophageal reflux disease)   . Neuromuscular disorder   . Multiple sclerosis   . Anxiety   . Hyperlipemia   . Pneumonia   . Colitis   . Unable to ambulate     Past Surgical History  Procedure Laterality Date  . Tubal ligation    . Flexible sigmoidoscopy  10/05/2011    Procedure: FLEXIBLE SIGMOIDOSCOPY;  Surgeon: Winfield Cunas., MD;  Location: [facility];  Service: Endoscopy;  Laterality: N/A;   pt. coming from [place] daughter phone no. [phone] might come with her  sedation is needed, pt. is paraplegic  . Esophagogastroduodenoscopy N/A 09/09/2012    Procedure: ESOPHAGOGASTRODUODENOSCOPY (EGD);  Surgeon: Winfield Cunas., MD;  Location: [facility] ENDOSCOPY;  Service: Endoscopy;  Laterality: N/A;  . Colonoscopy N/A 09/09/2012    Procedure: COLONOSCOPY;  Surgeon: Winfield Cunas., MD;  Location: [facility] ENDOSCOPY;  Service: Endoscopy;  Laterality: N/A;  possible flex sig    VITAL SIGNS BP 119/67  Pulse 79  Ht 5\' 4"  (1.626 m)  Wt 131 lb (59.421 kg)  BMI 22.47 kg/m2   Patient's Medications  New Prescriptions   No medications on file  Previous  Medications   ALPRAZOLAM (XANAX) 0.25 MG TABLET    Take 0.25 mg by mouth at bedtime as needed for sleep.   ASPIRIN 81 MG TABLET    Take 81 mg by mouth daily.   ATORVASTATIN (LIPITOR) 20 MG TABLET    Take 20 mg by mouth See admin instructions. Takes 1 tablet daily on mondays, Wednesdays, and fridays.   CETIRIZINE (ZYRTEC) 10 MG TABLET    Take 10 mg by mouth daily.   FERROUS SULFATE 325 (65 FE) MG TABLET    Take 1 tablet (325 mg total) by mouth daily with breakfast.   GABAPENTIN (NEURONTIN) 100 MG CAPSULE    Take 100 mg by mouth 3 (three) times daily.   IBUPROFEN (ADVIL,MOTRIN) 800 MG TABLET    Take 1 tablet (800 mg total) by mouth every 6 (six) hours as needed.   METFORMIN (GLUCOPHAGE) 500 MG TABLET    Take 500 mg by mouth daily.   METOPROLOL TARTRATE (LOPRESSOR) 25 MG TABLET    Take 25 mg by mouth 2 (two) times daily.   PANTOPRAZOLE (PROTONIX) 40 MG TABLET    Take 40 mg by mouth daily.   POLYETHYLENE GLYCOL (MIRALAX / GLYCOLAX) PACKET    Take 17 g by mouth 2 (two) times daily.   POLYVINYL ALCOHOL (LIQUIFILM  TEARS) 1.4 % OPHTHALMIC SOLUTION    Place 1 drop into both eyes 3 (three) times daily as needed (dry eyes).   SKIN PROTECTANTS, MISC. (DIMETHICONE-ZINC OXIDE) CREAM    Apply 1 application topically 2 (two) times daily as needed for dry skin.   SULFADIAZINE 500 MG TABLET    Take 1,000 mg by mouth 2 (two) times daily.   VITAMIN B-12 100 MCG TABLET    Take 1 tablet (100 mcg total) by mouth daily.  Modified Medications   No medications on file  Discontinued Medications   No medications on file    SIGNIFICANT DIAGNOSTIC EXAMS   10-31-12: chest x-ray: borderline cardiomegaly with change with new minimal pulmonary vascular congestion no pleural effusion. Mild bilateral atelectasis appears worse on left and new on right. Co-existing interstitial pneumonitis cannot be excluded at lung bases. No inflammatory consolidate or nodule. Mild bronchitis at perihilar regions.   07-11-13: chest x-ray;  borderline heart size and pulmonary vasculature; modeerate to prominent changes of bronchitis to both mid and lower lungs.   LABS REVIEWED:   09-11-12: wbc 8.4; hgb 9.5; hct 31.2; mcv 68.0; plt 296; glucose 146; bun 8; creat 0.40; k+3.4;na++144 Liver normal albumin 2.4 11-08-12: wbc 7.2; hgb 12.1; hct 36.0; mcv 78.4;plt 301; glucose 123; bun 14; creat 0.48; k+4.1na++143 03-13-13: wbc 6.5; hgb 14.2; hct 44.6 ;mcv 90.8 plt 221; glucose 113; bun 20; creat 0.4; k+4.1; na++140 chol 150; ldl 78; trig 200; hgb a1c 5.9; vit b12: 710; vit d 64.10  06-14-13: u/a: neg 07-09-13:rapid flu: neg  07-12-13: urine culture: levaquin     Review of Systems  Constitutional: Negative for malaise/fatigue.  HENT: Positive for ear pain.        Left ear has wax build up and is feeling better; is being treated with debrox   Respiratory: Negative for cough and shortness of breath.   Cardiovascular: Negative for chest pain and palpitations.  Gastrointestinal: Negative for heartburn and abdominal pain.  Genitourinary: Negative for dysuria.  Musculoskeletal: Negative for joint pain and myalgias.  Skin: Negative.   Neurological: Negative for headaches.  Psychiatric/Behavioral: The patient is not nervous/anxious.     Physical Exam  Constitutional: She is oriented to person, place, and time.  thin has facial tenderness present throat is red and inflamed.  Neck: No tracheal deviation present. No thyromegaly present.  Cardiovascular: Normal rate, regular rhythm and intact distal pulses.   Respiratory: Effort normal and breath sounds normal. No respiratory distress.  GI: Soft. Bowel sounds are normal. She exhibits no distension. There is no tenderness.  Musculoskeletal: She exhibits no edema.  Is unable to move any extremity is presently in bed  Lymphadenopathy:    She has no cervical adenopathy.  Neurological: She is alert and oriented to person, place, and time.  Skin: Skin is warm and dry.     ASSESSMENT/  PLAN:  1. Bronchitis and uti: will complete her levaquin and will continue to monitor her status; will continue to monitor her status and will monitor her status and will make changes in the future as indicated.

## 2013-08-02 ENCOUNTER — Non-Acute Institutional Stay (SKILLED_NURSING_FACILITY): Payer: Medicare Other | Admitting: Adult Health

## 2013-08-02 DIAGNOSIS — E559 Vitamin D deficiency, unspecified: Secondary | ICD-10-CM

## 2013-08-02 DIAGNOSIS — I10 Essential (primary) hypertension: Secondary | ICD-10-CM

## 2013-08-02 DIAGNOSIS — IMO0001 Reserved for inherently not codable concepts without codable children: Secondary | ICD-10-CM

## 2013-08-02 DIAGNOSIS — E1165 Type 2 diabetes mellitus with hyperglycemia: Secondary | ICD-10-CM

## 2013-08-05 ENCOUNTER — Encounter: Payer: Self-pay | Admitting: Adult Health

## 2013-08-05 MED ORDER — METFORMIN HCL ER 500 MG PO TB24: 500.0000 mg | ORAL_TABLET | Freq: Every day | ORAL | Status: DC

## 2013-08-05 NOTE — Progress Notes (Signed)
Patient ID: [name], female   DOB: July 05, 1935, 79 y.o.   MRN: [id number]     [facility]  Allergies  Allergen Reactions  . Contrast Media [Iodinated Diagnostic Agents]     Unknown  . Penicillins     Unknown     Chief Complaint  Patient presents with  . Acute Visit    patient concerns    HPI:  Her cbg's has been slowly climbing; at some point her metformin was stopped along with her glyburide. Will need to restart the metformin. She is concerned about having her iron lowered to one time daily. I have reassured her that her blood counts have been stable and that they will be monitored. She is also concerned about her not having to take vit d any longer. We discussed that she did not need the medication at this time as her level was well about 30 at 64. She did verbalize understanding.   Past Medical History  Diagnosis Date  . Hypertension   . Diabetes mellitus   . GERD (gastroesophageal reflux disease)   . Neuromuscular disorder   . Multiple sclerosis   . Anxiety   . Hyperlipemia   . Pneumonia   . Colitis   . Unable to ambulate     Past Surgical History  Procedure Laterality Date  . Tubal ligation    . Flexible sigmoidoscopy  10/05/2011    Procedure: FLEXIBLE SIGMOIDOSCOPY;  Surgeon: Winfield Cunas., MD;  Location: [facility];  Service: Endoscopy;  Laterality: N/A;   pt. coming from [place] daughter phone no. [phone] might come with her  sedation is needed, pt. is paraplegic  . Esophagogastroduodenoscopy N/A 09/09/2012    Procedure: ESOPHAGOGASTRODUODENOSCOPY (EGD);  Surgeon: Winfield Cunas., MD;  Location: [facility] ENDOSCOPY;  Service: Endoscopy;  Laterality: N/A;  . Colonoscopy N/A 09/09/2012    Procedure: COLONOSCOPY;  Surgeon: Winfield Cunas., MD;  Location: [facility] ENDOSCOPY;  Service: Endoscopy;  Laterality: N/A;  possible flex sig    VITAL SIGNS BP 127/68  Pulse 89  Ht 5\' 4"  (1.626 m)  Wt 130 lb 8 oz (59.194 kg)  BMI  22.39 kg/m2   Patient's Medications  New Prescriptions   No medications on file  Previous Medications   ALPRAZOLAM (XANAX) 0.25 MG TABLET    Take 0.25 mg by mouth at bedtime as needed for sleep.   ASPIRIN 81 MG TABLET    Take 81 mg by mouth daily.   ATORVASTATIN (LIPITOR) 20 MG TABLET    Take 20 mg by mouth See admin instructions. Takes 1 tablet daily on mondays, Wednesdays, and fridays.   CETIRIZINE (ZYRTEC) 10 MG TABLET    Take 10 mg by mouth daily.   FERROUS SULFATE 325 (65 FE) MG TABLET    Take 1 tablet (325 mg total) by mouth daily with breakfast.   GABAPENTIN (NEURONTIN) 100 MG CAPSULE    Take 100 mg by mouth 3 (three) times daily.   IBUPROFEN (ADVIL,MOTRIN) 800 MG TABLET    Take 1 tablet (800 mg total) by mouth every 6 (six) hours as needed.   METOPROLOL TARTRATE (LOPRESSOR) 25 MG TABLET    Take 25 mg by mouth 2 (two) times daily.   PANTOPRAZOLE (PROTONIX) 40 MG TABLET    Take 40 mg by mouth daily.   POLYETHYLENE GLYCOL (MIRALAX / GLYCOLAX) PACKET    Take 17 g by mouth 2 (two) times daily.   POLYVINYL ALCOHOL (LIQUIFILM TEARS) 1.4 % OPHTHALMIC SOLUTION  Place 1 drop into both eyes 3 (three) times daily as needed (dry eyes).   SKIN PROTECTANTS, MISC. (DIMETHICONE-ZINC OXIDE) CREAM    Apply 1 application topically 2 (two) times daily as needed for dry skin.   SULFADIAZINE 500 MG TABLET    Take 1,000 mg by mouth 2 (two) times daily.   VITAMIN B-12 100 MCG TABLET    Take 1 tablet (100 mcg total) by mouth daily.  Modified Medications   No medications on file  Discontinued Medications   METFORMIN (GLUCOPHAGE) 500 MG TABLET    Take 500 mg by mouth daily.    SIGNIFICANT DIAGNOSTIC EXAMS  10-31-12: chest x-ray: borderline cardiomegaly with change with new minimal pulmonary vascular congestion no pleural effusion. Mild bilateral atelectasis appears worse on left and new on right. Co-existing interstitial pneumonitis cannot be excluded at lung bases. No inflammatory consolidate or nodule.  Mild bronchitis at perihilar regions.   07-11-13: chest x-ray; borderline heart size and pulmonary vasculature; modeerate to prominent changes of bronchitis to both mid and lower lungs.   LABS REVIEWED:   09-11-12: wbc 8.4; hgb 9.5; hct 31.2; mcv 68.0; plt 296; glucose 146; bun 8; creat 0.40; k+3.4;na++144 Liver normal albumin 2.4 11-08-12: wbc 7.2; hgb 12.1; hct 36.0; mcv 78.4;plt 301; glucose 123; bun 14; creat 0.48; k+4.1na++143 03-13-13: wbc 6.5; hgb 14.2; hct 44.6 ;mcv 90.8 plt 221; glucose 113; bun 20; creat 0.4; k+4.1; na++140 chol 150; ldl 78; trig 200; hgb a1c 5.9; vit b12: 710; vit d 64.10  06-14-13: u/a: neg 07-09-13:rapid flu: neg  07-12-13: urine culture: levaquin     Review of Systems  Constitutional: Negative for malaise/fatigue.    Respiratory: Negative for cough and shortness of breath.   Cardiovascular: Negative for chest pain and palpitations.  Gastrointestinal: Negative for heartburn and abdominal pain.  Genitourinary: Negative for dysuria.  Musculoskeletal: Negative for joint pain and myalgias.  Skin: Negative.   Neurological: Negative for headaches.  Psychiatric/Behavioral: The patient is not nervous/anxious.     Physical Exam  Constitutional: She is oriented to person, place, and time.  thin has facial tenderness present throat is red and inflamed.  Neck: No tracheal deviation present. No thyromegaly present.  Cardiovascular: Normal rate, regular rhythm and intact distal pulses.   Respiratory: Effort normal and breath sounds normal. No respiratory distress.  GI: Soft. Bowel sounds are normal. She exhibits no distension. There is no tenderness.  Musculoskeletal: She exhibits no edema.  Is unable to move any extremity is presently in bed  Lymphadenopathy:    She has no cervical adenopathy.  Neurological: She is alert and oriented to person, place, and time.  Skin: Skin is warm and dry.      ASSESSMENT/ PLAN:  1. Diabetes: at some point her metformin was  stopped; will restart her metformin xr 500 mg daily and will monitor her status.   2. Hypertension: is presently stable will not make changes in her medications at this time and will continue to monitor her status.

## 2013-08-12 ENCOUNTER — Encounter: Payer: Self-pay | Admitting: Internal Medicine

## 2013-08-12 ENCOUNTER — Non-Acute Institutional Stay (SKILLED_NURSING_FACILITY): Payer: Medicare Other | Admitting: Internal Medicine

## 2013-08-12 DIAGNOSIS — K5900 Constipation, unspecified: Secondary | ICD-10-CM

## 2013-08-12 DIAGNOSIS — F411 Generalized anxiety disorder: Secondary | ICD-10-CM

## 2013-08-12 DIAGNOSIS — D649 Anemia, unspecified: Secondary | ICD-10-CM

## 2013-08-12 DIAGNOSIS — R059 Cough, unspecified: Secondary | ICD-10-CM | POA: Insufficient documentation

## 2013-08-12 DIAGNOSIS — R05 Cough: Secondary | ICD-10-CM

## 2013-08-12 DIAGNOSIS — G500 Trigeminal neuralgia: Secondary | ICD-10-CM

## 2013-08-12 DIAGNOSIS — K219 Gastro-esophageal reflux disease without esophagitis: Secondary | ICD-10-CM

## 2013-08-12 DIAGNOSIS — E118 Type 2 diabetes mellitus with unspecified complications: Secondary | ICD-10-CM

## 2013-08-12 DIAGNOSIS — G35 Multiple sclerosis: Secondary | ICD-10-CM

## 2013-08-12 DIAGNOSIS — I10 Essential (primary) hypertension: Secondary | ICD-10-CM

## 2013-08-12 DIAGNOSIS — K5190 Ulcerative colitis, unspecified, without complications: Secondary | ICD-10-CM

## 2013-08-12 NOTE — Progress Notes (Signed)
Patient ID: [name], female   DOB: 27-Jan-1935, 79 y.o.   MRN: [id number]     [facility]  Chief Complaint  Patient presents with  . Medical Managment of Chronic Issues   Allergies  Allergen Reactions  . Contrast Media [Iodinated Diagnostic Agents]     Unknown  . Penicillins     Unknown   Code- full code  HPI 79 y/o female pt is here for long term care. She has hx of multiple sclerosis, anemia, dm among others. She is bed bound and total care. She complaints of her face hurting with needle like sensation. She also has cough but is unable to bring out the phlegm and that makes her stuffed up. She would like to be on all medications if possible to make her better. She wants her iron supplement twice a day. Her recent cbc has normal hemoglobin. Her cbg reviewed and is 94-120. Her anxiety has been calm. She recently completed rx for uti.   Review of Systems  Constitutional: Negative for fever, chills, weight loss, malaise/fatigue and diaphoresis.  HENT: Negative for hearing loss and sore throat.   Eyes: Negative for blurred vision, double vision and discharge.  Respiratory: Negative for shortness of breath and wheezing.   Cardiovascular: Negative for chest pain, palpitations, orthopnea and leg swelling.  Gastrointestinal: Negative for heartburn, nausea, vomiting, abdominal pain, diarrhea and constipation.  Genitourinary: Negative for dysuria, urgency, frequency and flank pain.  Musculoskeletal: Negative for joint pain and myalgias.  Skin: Negative for itching and rash.  Neurological: Negative for dizziness, tingling, headaches.  Psychiatric/Behavioral: Negative for depression and memory loss. The patient is not nervous/anxious.    Past Medical History  Diagnosis Date  . Hypertension   . Diabetes mellitus   . GERD (gastroesophageal reflux disease)   . Neuromuscular disorder   . Multiple sclerosis   . Anxiety   . Hyperlipemia   . Pneumonia   . Colitis   . Unable  to ambulate    Current Outpatient Prescriptions on File Prior to Visit  Medication Sig Dispense Refill  . ALPRAZolam (XANAX) 0.25 MG tablet Take 0.25 mg by mouth at bedtime as needed for sleep.      Marland Kitchen aspirin 81 MG tablet Take 81 mg by mouth daily.      Marland Kitchen atorvastatin (LIPITOR) 20 MG tablet Take 20 mg by mouth See admin instructions. Takes 1 tablet daily on mondays, Wednesdays, and fridays.      . cetirizine (ZYRTEC) 10 MG tablet Take 10 mg by mouth daily.      . ferrous sulfate 325 (65 FE) MG tablet Take 1 tablet (325 mg total) by mouth daily with breakfast.  60 tablet  0  . ibuprofen (ADVIL,MOTRIN) 800 MG tablet Take 1 tablet (800 mg total) by mouth every 6 (six) hours as needed.  30 tablet  12  . metFORMIN (GLUCOPHAGE XR) 500 MG 24 hr tablet Take 1 tablet (500 mg total) by mouth daily with breakfast.  30 tablet  11  . metoprolol tartrate (LOPRESSOR) 25 MG tablet Take 25 mg by mouth 2 (two) times daily.      . pantoprazole (PROTONIX) 40 MG tablet Take 40 mg by mouth daily.      . polyethylene glycol (MIRALAX / GLYCOLAX) packet Take 17 g by mouth 2 (two) times daily.  14 each  11  . polyvinyl alcohol (LIQUIFILM TEARS) 1.4 % ophthalmic solution Place 1 drop into both eyes 3 (three) times daily as needed (  dry eyes).      Marland Kitchen sulfaDIAZINE 500 MG tablet Take 1,000 mg by mouth 2 (two) times daily.      . vitamin B-12 100 MCG tablet Take 1 tablet (100 mcg total) by mouth daily.  30 tablet  0   No current facility-administered medications on file prior to visit.    Physical exam BP 111/73  Pulse 88  Temp(Src) 98.8 F (37.1 C)  Resp 18  SpO2 98%  Constitutional: She is oriented to person, place, and time. thin built, no temporal tenderness Neck: No tracheal deviation present. No thyromegaly present.  Cardiovascular: Normal rate, regular rhythm and intact distal pulses.   Respiratory: Effort normal and breath sounds normal. No respiratory distress.  GI: Soft. Bowel sounds are normal. She  exhibits no distension. There is no tenderness.  Musculoskeletal: She exhibits no edema. Is unable to move any extremity is presently in bed  Lymphadenopathy:    She has no cervical adenopathy.  Neurological: She is alert and oriented to person, place, and time.  Skin: Skin is warm and dry.   Labs- 09-11-12: wbc 8.4; hgb 9.5; hct 31.2; mcv 68.0; plt 296; glucose 146; bun 8; creat 0.40; k+3.4;na++144 Liver normal albumin 2.4 11-08-12: wbc 7.2; hgb 12.1; hct 36.0; mcv 78.4;plt 301; glucose 123; bun 14; creat 0.48; k+4.1na++143 03-13-13: wbc 6.5; hgb 14.2; hct 44.6 ;mcv 90.8 plt 221; glucose 113; bun 20; creat 0.4; k+4.1; na++140 chol 150; ldl 78; trig 200; hgb a1c 5.9; vit b12: 710; vit d 64.10   06-14-13: u/a: neg 07-09-13:rapid flu: neg   07-12-13: urine culture: levaquin   08-05-13: wbc 7.8, hb 13.8, hct 43.3, plt 263, a1c 6.7   ASSESSMENT/ PLAN:  Dm type 2 Controlled a1c. Reviewed cbg. Given increased risk for hypoglycemia with her current regimen, will change her glyburide to 2.5 mg once a day only. Continue metformin xr 500 mg daily. counselled pt about diabetes, interpretation of a1c and risks of hypoglycemia  Anemia Has stable and normal h/h. Will d/c iron supplement. Monitor cbc every few months. Continue b12 supplement  Cough Will have her on mucinex 400 mg twice a day x 5 days to help losen her mucus. Normal lung exam  Ulcerative colitis No recent flare up. continue sulfadiazine 1 gm twice daily and will monitor  hypertension continue lopressor 25 mg twice daily and asa 81 mg daily    Anxiety state, unspecified Will continue her xanax 0.25 mg nightly as needed   Facial pain Likely from trigeminal neuralgia in setting of her MS. Will increase her neurontin from 100 mg tid to 200 mg in am and evening and 100 mg in afternoon. Reassess  GERD  continue protonix 40 mg daily   Unspecified constipation Continue miralax   Labs- a1c, cbc in 3 months  Spent more than 50 minutes  with the patient

## 2013-09-10 ENCOUNTER — Non-Acute Institutional Stay (SKILLED_NURSING_FACILITY): Payer: Medicare Other | Admitting: Adult Health

## 2013-09-10 DIAGNOSIS — K5190 Ulcerative colitis, unspecified, without complications: Secondary | ICD-10-CM

## 2013-09-10 DIAGNOSIS — K219 Gastro-esophageal reflux disease without esophagitis: Secondary | ICD-10-CM

## 2013-09-10 DIAGNOSIS — E559 Vitamin D deficiency, unspecified: Secondary | ICD-10-CM

## 2013-09-10 DIAGNOSIS — E785 Hyperlipidemia, unspecified: Secondary | ICD-10-CM

## 2013-09-10 DIAGNOSIS — D649 Anemia, unspecified: Secondary | ICD-10-CM

## 2013-09-10 DIAGNOSIS — F411 Generalized anxiety disorder: Secondary | ICD-10-CM

## 2013-09-10 DIAGNOSIS — G35 Multiple sclerosis: Secondary | ICD-10-CM

## 2013-09-10 DIAGNOSIS — I10 Essential (primary) hypertension: Secondary | ICD-10-CM

## 2013-09-10 DIAGNOSIS — E118 Type 2 diabetes mellitus with unspecified complications: Secondary | ICD-10-CM

## 2013-09-10 LAB — CBC AND DIFFERENTIAL
HEMATOCRIT: 42 % (ref 36–46)
HEMOGLOBIN: 13 g/dL (ref 12.0–16.0)
PLATELETS: 298 10*3/uL (ref 150–399)
WBC: 9 10*3/mL

## 2013-09-10 LAB — BASIC METABOLIC PANEL
BUN: 23 mg/dL — AB (ref 4–21)
Creatinine: 0.6 mg/dL (ref 0.5–1.1)
GLUCOSE: 134 mg/dL
POTASSIUM: 4.3 mmol/L (ref 3.4–5.3)
Sodium: 141 mmol/L (ref 137–147)

## 2013-09-10 LAB — LIPID PANEL
CHOLESTEROL: 143 mg/dL (ref 0–200)
LDL Cholesterol: 83 mg/dL
TRIGLYCERIDES: 148 mg/dL (ref 40–160)

## 2013-09-10 LAB — HEMOGLOBIN A1C: Hgb A1c MFr Bld: 7 % — AB (ref 4.0–6.0)

## 2013-09-16 ENCOUNTER — Encounter: Payer: Self-pay | Admitting: Adult Health

## 2013-09-16 NOTE — Progress Notes (Signed)
Patient ID: [name], female   DOB: 07/08/1935, 79 y.o.   MRN: [id number]    [facility]  Allergies  Allergen Reactions  . Contrast Media [Iodinated Diagnostic Agents]     Unknown  . Penicillins     Unknown     Chief Complaint  Patient presents with  . Medical Managment of Chronic Issues    HPI:  She is being seen for the management of her chronic illnesses. Overall her status remains without change. There are no concerns being voiced by the nursing staff at this time. She is not voicing any complaints or concerns at this time.    Past Medical History  Diagnosis Date  . Hypertension   . Diabetes mellitus   . GERD (gastroesophageal reflux disease)   . Neuromuscular disorder   . Multiple sclerosis   . Anxiety   . Hyperlipemia   . Pneumonia   . Colitis   . Unable to ambulate     Past Surgical History  Procedure Laterality Date  . Tubal ligation    . Flexible sigmoidoscopy  10/05/2011    Procedure: FLEXIBLE SIGMOIDOSCOPY;  Surgeon: Winfield Cunas., MD;  Location: [facility];  Service: Endoscopy;  Laterality: N/A;   pt. coming from [place] daughter phone no. [phone] might come with her  sedation is needed, pt. is paraplegic  . Esophagogastroduodenoscopy N/A 09/09/2012    Procedure: ESOPHAGOGASTRODUODENOSCOPY (EGD);  Surgeon: Winfield Cunas., MD;  Location: [facility] ENDOSCOPY;  Service: Endoscopy;  Laterality: N/A;  . Colonoscopy N/A 09/09/2012    Procedure: COLONOSCOPY;  Surgeon: Winfield Cunas., MD;  Location: [facility] ENDOSCOPY;  Service: Endoscopy;  Laterality: N/A;  possible flex sig    VITAL SIGNS BP 110/80  Pulse 70  Ht 5\' 4"  (1.626 m)  Wt 131 lb (59.421 kg)  BMI 22.47 kg/m2   Patient's Medications  New Prescriptions   No medications on file  Previous Medications   ALPRAZOLAM (XANAX) 0.25 MG TABLET    Take 0.25 mg by mouth at bedtime as needed for sleep.   ASPIRIN 81 MG TABLET    Take 81 mg by mouth daily.   ATORVASTATIN (LIPITOR) 20 MG TABLET    Take 20 mg by mouth See admin instructions. Takes 1 tablet daily on mondays, Wednesdays, and fridays.   CETIRIZINE (ZYRTEC) 10 MG TABLET    Take 10 mg by mouth daily.   GABAPENTIN (NEURONTIN) 100 MG CAPSULE    Take 100 mg in the am and 200 mg in the pm    GLYBURIDE (DIABETA) 2.5 MG TABLET    Take 2.5 mg by mouth 2 (two) times daily with a meal.   IBUPROFEN (ADVIL,MOTRIN) 800 MG TABLET    Take 1 tablet (800 mg total) by mouth every 6 (six) hours as needed.   METFORMIN (GLUCOPHAGE XR) 500 MG 24 HR TABLET    Take 1 tablet (500 mg total) by mouth daily with breakfast.   METOPROLOL TARTRATE (LOPRESSOR) 25 MG TABLET    Take 25 mg by mouth 2 (two) times daily.   PANTOPRAZOLE (PROTONIX) 40 MG TABLET    Take 40 mg by mouth daily.   POLYETHYLENE GLYCOL (MIRALAX / GLYCOLAX) PACKET    Take 17 g by mouth 2 (two) times daily.   POLYVINYL ALCOHOL (LIQUIFILM TEARS) 1.4 % OPHTHALMIC SOLUTION    Place 1 drop into both eyes 3 (three) times daily as needed (dry eyes).   SULFADIAZINE 500 MG TABLET    Take 1,000  mg by mouth 2 (two) times daily.   VITAMIN B-12 100 MCG TABLET    Take 1 tablet (100 mcg total) by mouth daily.  Modified Medications   No medications on file  Discontinued Medications   FERROUS SULFATE 325 (65 FE) MG TABLET    Take 1 tablet (325 mg total) by mouth daily with breakfast.    SIGNIFICANT DIAGNOSTIC EXAMS  10-31-12: chest x-ray: borderline cardiomegaly with change with new minimal pulmonary vascular congestion no pleural effusion. Mild bilateral atelectasis appears worse on left and new on right. Co-existing interstitial pneumonitis cannot be excluded at lung bases. No inflammatory consolidate or nodule. Mild bronchitis at perihilar regions.   07-11-13: chest x-ray; borderline heart size and pulmonary vasculature; modeerate to prominent changes of bronchitis to both mid and lower lungs.     LABS REVIEWED:   09-11-12: wbc 8.4; hgb 9.5; hct 31.2; mcv 68.0;  plt 296; glucose 146; bun 8; creat 0.40; k+3.4;na++144 Liver normal albumin 2.4 11-08-12: wbc 7.2; hgb 12.1; hct 36.0; mcv 78.4;plt 301; glucose 123; bun 14; creat 0.48; k+4.1na++143 03-13-13: wbc 6.5; hgb 14.2; hct 44.6 ;mcv 90.8 plt 221; glucose 113; bun 20; creat 0.4; k+4.1; na++140 chol 150; ldl 78; trig 200; hgb a1c 5.9; vit b12: 710; vit d 64.10  06-14-13: u/a: neg 07-09-13:rapid flu: neg  07-12-13: urine culture: levaquin  08-05-13: wbc 7.8;hgb 13.8; hct 43.3; mcv 91.4 ;plt 263; hgb a1c 6.7     Review of Systems  Constitutional: Negative for malaise/fatigue.    Respiratory: Negative for cough and shortness of breath.   Cardiovascular: Negative for chest pain and palpitations.  Gastrointestinal: Negative for heartburn and abdominal pain.  Genitourinary: Negative for dysuria.  Musculoskeletal: Negative for joint pain and myalgias.  Skin: Negative.   Neurological: Negative for headaches.  Psychiatric/Behavioral: The patient is not nervous/anxious.     Physical Exam  Constitutional: She is oriented to person, place, and time.  thin has facial tenderness present throat is red and inflamed.  Neck: No tracheal deviation present. No thyromegaly present.  Cardiovascular: Normal rate, regular rhythm and intact distal pulses.   Respiratory: Effort normal and breath sounds normal. No respiratory distress.  GI: Soft. Bowel sounds are normal. She exhibits no distension. There is no tenderness.  Musculoskeletal: She exhibits no edema.  Is unable to move any extremity is presently in bed  Lymphadenopathy:    She has no cervical adenopathy.  Neurological: She is alert and oriented to person, place, and time.  Skin: Skin is warm and dry.      ASSESSMENT/ PLAN:   MS (multiple sclerosis) Her status is unchanged; she has no movement in any extremities; she is taking neurontin 100 mg in the am and 200 mg in the afternoon for peripheral neuropathy; will continue current plan of care and will  monitor her status   Anemia Her anemia is stable is presently off medication and will monitor   Ulcerative colitis, unspecified She is stable will continue azulfidine 1 gm twice daily and will monitor  GERD (gastroesophageal reflux disease) Is stable will continue protonix 40 mg daily   Unspecified constipation Is stable will continue miralax twice daily   Essential hypertension, benign Is stable will continue lopressor 25 mg twice daily; asa 81 mg daily  and will monitor   Dyslipidemia Is stable will continue lipitor 20 mg three times weekly    Type II or unspecified type diabetes mellitus without mention of complication, uncontrolled Will continue  glyburide 2.5 mg  daily  and will continue metformin xr 500 mg daily  Allergic rhinitis She  will continue her zyrtec 10 mg daily   Anxiety state, unspecified Will continue her xanax 0.25 mg nightly as needed   Vit d deficiency Is stable; is presently not on replacement therapy at this time.           Ok Edwards NP [facility]  Contact [phone] Monday through Friday 8am- 5pm  After hours call [phone]

## 2013-10-09 ENCOUNTER — Non-Acute Institutional Stay (SKILLED_NURSING_FACILITY): Payer: Medicare Other | Admitting: Adult Health

## 2013-10-09 DIAGNOSIS — K5190 Ulcerative colitis, unspecified, without complications: Secondary | ICD-10-CM

## 2013-10-09 DIAGNOSIS — G35D Multiple sclerosis, unspecified: Secondary | ICD-10-CM

## 2013-10-09 DIAGNOSIS — I10 Essential (primary) hypertension: Secondary | ICD-10-CM

## 2013-10-09 DIAGNOSIS — D649 Anemia, unspecified: Secondary | ICD-10-CM

## 2013-10-09 DIAGNOSIS — G8250 Quadriplegia, unspecified: Secondary | ICD-10-CM

## 2013-10-09 DIAGNOSIS — IMO0001 Reserved for inherently not codable concepts without codable children: Secondary | ICD-10-CM

## 2013-10-09 DIAGNOSIS — G35 Multiple sclerosis: Secondary | ICD-10-CM

## 2013-10-09 DIAGNOSIS — K219 Gastro-esophageal reflux disease without esophagitis: Secondary | ICD-10-CM

## 2013-10-09 DIAGNOSIS — E559 Vitamin D deficiency, unspecified: Secondary | ICD-10-CM

## 2013-10-09 DIAGNOSIS — E1165 Type 2 diabetes mellitus with hyperglycemia: Secondary | ICD-10-CM

## 2013-10-09 DIAGNOSIS — J309 Allergic rhinitis, unspecified: Secondary | ICD-10-CM

## 2013-10-09 DIAGNOSIS — K5900 Constipation, unspecified: Secondary | ICD-10-CM

## 2013-10-19 ENCOUNTER — Encounter: Payer: Self-pay | Admitting: Adult Health

## 2013-10-19 NOTE — Progress Notes (Signed)
Patient ID: [name], female   DOB: 1935/04/30, 79 y.o.   MRN: [id number]     [facility]  Allergies  Allergen Reactions  . Contrast Media [Iodinated Diagnostic Agents]     Unknown  . Penicillins     Unknown     Chief Complaint  Patient presents with  . Medical Managment of Chronic Issues    HPI:  She is being seen for the management of her chronic illnesses. Overall there is little change in her status. There are no concerns being voiced by the nursing staff at this time. She is not voicing any complaints today.   Past Medical History  Diagnosis Date  . Hypertension   . Diabetes mellitus   . GERD (gastroesophageal reflux disease)   . Neuromuscular disorder   . Multiple sclerosis   . Anxiety   . Hyperlipemia   . Pneumonia   . Colitis   . Unable to ambulate     Past Surgical History  Procedure Laterality Date  . Tubal ligation    . Flexible sigmoidoscopy  10/05/2011    Procedure: FLEXIBLE SIGMOIDOSCOPY;  Surgeon: Winfield Cunas., MD;  Location: [facility] ENDOS[facility];  Service: Endoscopy;  Laterality: N/A;   pt. coming from [place] daughter phone no. [phone] might come with her  sedation is needed, pt. is paraplegic  . Esophagogastroduodenoscopy N/A 09/09/2012    Procedure: ESOPHAGOGASTRODUODENOSCOPY (EGD);  Surgeon: Winfield Cunas., MD;  Location: [facility] ENDOSCOPY;  Service: Endoscopy;  Laterality: N/A;  . Colonoscopy N/A 09/09/2012    Procedure: COLONOSCOPY;  Surgeon: Winfield Cunas., MD;  Location: [facility] ENDOSCOPY;  Service: Endoscopy;  Laterality: N/A;  possible flex sig    VITAL SIGNS BP 117/71  Pulse 74  Ht 5\' 4"  (1.626 m)  Wt 134 lb 12.8 oz (61.145 kg)  BMI 23.13 kg/m2   Patient's Medications  New Prescriptions   No medications on file  Previous Medications   ALPRAZOLAM (XANAX) 0.25 MG TABLET    Take 0.25 mg by mouth at bedtime as needed for sleep.   ASPIRIN 81 MG TABLET    Take 81 mg by mouth daily.   ATORVASTATIN  (LIPITOR) 20 MG TABLET    Take 20 mg by mouth See admin instructions. Takes 1 tablet daily on mondays, Wednesdays, and fridays.   CETIRIZINE (ZYRTEC) 10 MG TABLET    Take 10 mg by mouth daily.   GABAPENTIN (NEURONTIN) 100 MG CAPSULE    Take 100 mg by mouth 3 (three) times daily.   GLYBURIDE (DIABETA) 2.5 MG TABLET    Take 2.5 mg by mouth 2 (two) times daily with a meal.   IBUPROFEN (ADVIL,MOTRIN) 800 MG TABLET    Take 1 tablet (800 mg total) by mouth every 6 (six) hours as needed.   METFORMIN (GLUCOPHAGE XR) 500 MG 24 HR TABLET    Take 1 tablet (500 mg total) by mouth daily with breakfast.   METOPROLOL TARTRATE (LOPRESSOR) 25 MG TABLET    Take 25 mg by mouth 2 (two) times daily.   PANTOPRAZOLE (PROTONIX) 40 MG TABLET    Take 40 mg by mouth daily.   POLYETHYLENE GLYCOL (MIRALAX / GLYCOLAX) PACKET    Take 17 g by mouth 2 (two) times daily.   POLYVINYL ALCOHOL (LIQUIFILM TEARS) 1.4 % OPHTHALMIC SOLUTION    Place 1 drop into both eyes 3 (three) times daily as needed (dry eyes).   SULFADIAZINE 500 MG TABLET    Take 1,000 mg by mouth  2 (two) times daily.   VITAMIN B-12 100 MCG TABLET    Take 1 tablet (100 mcg total) by mouth daily.  Modified Medications   No medications on file  Discontinued Medications   No medications on file    SIGNIFICANT DIAGNOSTIC EXAMS   10-31-12: chest x-ray: borderline cardiomegaly with change with new minimal pulmonary vascular congestion no pleural effusion. Mild bilateral atelectasis appears worse on left and new on right. Co-existing interstitial pneumonitis cannot be excluded at lung bases. No inflammatory consolidate or nodule. Mild bronchitis at perihilar regions.   07-11-13: chest x-ray; borderline heart size and pulmonary vasculature; modeerate to prominent changes of bronchitis to both mid and lower lungs.     LABS REVIEWED:   11-08-12: wbc 7.2; hgb 12.1; hct 36.0; mcv 78.4;plt 301; glucose 123; bun 14; creat 0.48; k+4.1na++143 03-13-13: wbc 6.5; hgb 14.2; hct  44.6 ;mcv 90.8 plt 221; glucose 113; bun 20; creat 0.4; k+4.1; na++140 chol 150; ldl 78; trig 200; hgb a1c 5.9; vit b12: 710; vit d 64.10  06-14-13: u/a: neg 07-09-13:rapid flu: neg  07-12-13: urine culture: levaquin  08-05-13: wbc 7.8;hgb 13.8; hct 43.3; mcv 91.4 ;plt 263; hgb a1c 6.7  09-10-13: wbc 9.0; hgb 13.0; hct 41.7; mcv 93.1 plt 298; glucose 134; bun 23; creat 0.6; k+4.3; na++141 chol 143 ;ldl 83 trig 148; hgb a1c 7.0; vit b12: 805     Review of Systems  Constitutional: Negative for malaise/fatigue.    Respiratory: Negative for cough and shortness of breath.   Cardiovascular: Negative for chest pain and palpitations.  Gastrointestinal: Negative for heartburn and abdominal pain.  Genitourinary: Negative for dysuria.  Musculoskeletal: Negative for joint pain and myalgias.  Skin: Negative.   Neurological: Negative for headaches.  Psychiatric/Behavioral: The patient is not nervous/anxious.     Physical Exam  Constitutional: She is oriented to person, place, and time.  thin   Neck: No tracheal deviation present. No thyromegaly present.  Cardiovascular: Normal rate, regular rhythm and intact distal pulses.   Respiratory: Effort normal and breath sounds normal. No respiratory distress.  GI: Soft. Bowel sounds are normal. She exhibits no distension. There is no tenderness.  Musculoskeletal: She exhibits no edema.  Is unable to move any extremity is presently in bed  Lymphadenopathy:    She has no cervical adenopathy.  Neurological: She is alert and oriented to person, place, and time.  Skin: Skin is warm and dry.      ASSESSMENT/ PLAN:   MS (multiple sclerosis) Her status is unchanged; she has no movement in any extremities; she is taking neurontin 100 mg in the am and 200 mg in the afternoon for peripheral neuropathy; will continue current plan of care and will monitor her status   Anemia Her anemia is stable is presently off medication and will monitor   Ulcerative  colitis, unspecified She is stable will continue azulfidine 1 gm twice daily and will monitor  GERD (gastroesophageal reflux disease) Is stable will continue protonix 40 mg daily   Unspecified constipation Is stable will continue miralax twice daily   Essential hypertension, benign Is stable will continue lopressor 25 mg twice daily; asa 81 mg daily  and will monitor   Dyslipidemia Is stable will continue lipitor 20 mg three times weekly    Type II or unspecified type diabetes mellitus without mention of complication, uncontrolled Will continue  glyburide 2.5 mg  daily and will continue metformin xr 500 mg daily  Allergic rhinitis She  will continue her zyrtec  10 mg daily   Anxiety state, unspecified Will continue her xanax 0.25 mg nightly as needed   Vit d deficiency Is stable; is presently not on replacement therapy at this time.         Ok Edwards NP [facility]  Contact [phone] Monday through Friday 8am- 5pm  After hours call [phone]

## 2013-11-13 ENCOUNTER — Non-Acute Institutional Stay (SKILLED_NURSING_FACILITY): Payer: Medicare Other | Admitting: Adult Health

## 2013-11-13 DIAGNOSIS — F411 Generalized anxiety disorder: Secondary | ICD-10-CM

## 2013-11-13 DIAGNOSIS — G35 Multiple sclerosis: Secondary | ICD-10-CM

## 2013-11-13 DIAGNOSIS — E785 Hyperlipidemia, unspecified: Secondary | ICD-10-CM

## 2013-11-13 DIAGNOSIS — K5900 Constipation, unspecified: Secondary | ICD-10-CM

## 2013-11-13 DIAGNOSIS — G8250 Quadriplegia, unspecified: Secondary | ICD-10-CM

## 2013-11-13 DIAGNOSIS — G500 Trigeminal neuralgia: Secondary | ICD-10-CM

## 2013-11-13 DIAGNOSIS — D649 Anemia, unspecified: Secondary | ICD-10-CM

## 2013-11-13 DIAGNOSIS — K219 Gastro-esophageal reflux disease without esophagitis: Secondary | ICD-10-CM

## 2013-11-23 ENCOUNTER — Encounter: Payer: Self-pay | Admitting: Adult Health

## 2013-11-23 MED ORDER — FEXOFENADINE HCL 180 MG PO TABS: 180.0000 mg | ORAL_TABLET | Freq: Every day | ORAL | Status: DC

## 2013-11-23 NOTE — Progress Notes (Signed)
Patient ID: [name], female   DOB: 05/13/1935, 79 y.o.   MRN: [id number]     [facility]  Allergies  Allergen Reactions  . Contrast Media [Iodinated Diagnostic Agents]     Unknown  . Penicillins     Unknown    Chief Complaint  Patient presents with  . Annual Exam    HPI:  She is being seen for her annual exam. She has remained stable for the past several months. She is not appropriate for a dexa scan or for a mammogram at this time. There are no concerns being voiced by the nursing staff at this time. She is complaining of facial itching that the zyrtec is not helping with.   Past Medical History  Diagnosis Date  . Hypertension   . Diabetes mellitus   . GERD (gastroesophageal reflux disease)   . Neuromuscular disorder   . Multiple sclerosis   . Anxiety   . Hyperlipemia   . Pneumonia   . Colitis   . Unable to ambulate     Past Surgical History  Procedure Laterality Date  . Tubal ligation    . Flexible sigmoidoscopy  10/05/2011    Procedure: FLEXIBLE SIGMOIDOSCOPY;  Surgeon: Winfield Cunas., MD;  Location: [facility] ENDOSCOPY;  Service: Endoscopy;  Laterality: N/A;   pt. coming from [place] daughter phone no. [phone] might come with her  sedation is needed, pt. is paraplegic  . Esophagogastroduodenoscopy N/A 09/09/2012    Procedure: ESOPHAGOGASTRODUODENOSCOPY (EGD);  Surgeon: Winfield Cunas., MD;  Location: [facility] ENDOSCOPY;  Service: Endoscopy;  Laterality: N/A;  . Colonoscopy N/A 09/09/2012    Procedure: COLONOSCOPY;  Surgeon: Winfield Cunas., MD;  Location: [facility] ENDOSCOPY;  Service: Endoscopy;  Laterality: N/A;  possible flex sig    History   Social History  . Marital Status: Married    Spouse Name: N/A    Number of Children: N/A  . Years of Education: N/A   Occupational History  . Not on file.   Social History Main Topics  . Smoking status: Former Smoker -- 1.50 packs/day for 30 years    Quit date: 11/16/1995  .  Smokeless tobacco: Never Used  . Alcohol Use: No  . Drug Use: No  . Sexual Activity: Not on file   Other Topics Concern  . Not on file   Social History Narrative  . No narrative on file   Family History  Problem Relation Age of Onset  . CAD Mother   . Diabetes Daughter   . Diabetes Daughter   . Diabetes Son     CONSULTS Foot care Eye care   VITAL SIGNS BP 110/58  Pulse 96  Ht 5\' 4"  (1.626 m)  Wt 134 lb 12.8 oz (61.145 kg)  BMI 23.13 kg/m2   Patient's Medications  New Prescriptions   No medications on file  Previous Medications   ALPRAZOLAM (XANAX) 0.25 MG TABLET    Take 0.25 mg by mouth at bedtime as needed for sleep.   ASPIRIN 81 MG TABLET    Take 81 mg by mouth daily.   ATORVASTATIN (LIPITOR) 20 MG TABLET    Take 20 mg by mouth See admin instructions. Takes 1 tablet daily on mondays, Wednesdays, and fridays.   CETIRIZINE (ZYRTEC) 10 MG TABLET    Take 10 mg by mouth daily.   GABAPENTIN (NEURONTIN) 100 MG CAPSULE    Take 100-200 mg by mouth 3 (three) times daily. 200 mg twice daily and 100  mg in the afternoon   GLYBURIDE (DIABETA) 2.5 MG TABLET    Take 2.5 mg by mouth 2 (two) times daily with a meal.   IBUPROFEN (ADVIL,MOTRIN) 800 MG TABLET    Take 1 tablet (800 mg total) by mouth every 6 (six) hours as needed.   METFORMIN (GLUCOPHAGE XR) 500 MG 24 HR TABLET    Take 1 tablet (500 mg total) by mouth daily with breakfast.   METOPROLOL TARTRATE (LOPRESSOR) 25 MG TABLET    Take 25 mg by mouth 2 (two) times daily.   PANTOPRAZOLE (PROTONIX) 40 MG TABLET    Take 40 mg by mouth daily.   POLYETHYLENE GLYCOL (MIRALAX / GLYCOLAX) PACKET    Take 17 g by mouth 2 (two) times daily.   POLYVINYL ALCOHOL (LIQUIFILM TEARS) 1.4 % OPHTHALMIC SOLUTION    Place 1 drop into both eyes 3 (three) times daily as needed (dry eyes).   SULFADIAZINE 500 MG TABLET    Take 1,000 mg by mouth 2 (two) times daily.   VITAMIN B-12 100 MCG TABLET    Take 1 tablet (100 mcg total) by mouth daily.  Modified  Medications   No medications on file  Discontinued Medications   No medications on file    SIGNIFICANT DIAGNOSTIC EXAMS  10-31-12: chest x-ray: borderline cardiomegaly with change with new minimal pulmonary vascular congestion no pleural effusion. Mild bilateral atelectasis appears worse on left and new on right. Co-existing interstitial pneumonitis cannot be excluded at lung bases. No inflammatory consolidate or nodule. Mild bronchitis at perihilar regions.   07-11-13: chest x-ray; borderline heart size and pulmonary vasculature; modeerate to prominent changes of bronchitis to both mid and lower lungs.     LABS REVIEWED:   03-13-13: wbc 6.5; hgb 14.2; hct 44.6 ;mcv 90.8 plt 221; glucose 113; bun 20; creat 0.4; k+4.1; na++140 chol 150; ldl 78; trig 200; hgb a1c 5.9; vit b12: 710; vit d 64.10  06-14-13: u/a: neg 07-09-13:rapid flu: neg  07-12-13: urine culture: levaquin  08-05-13: wbc 7.8;hgb 13.8; hct 43.3; mcv 91.4 ;plt 263; hgb a1c 6.7  09-10-13: wbc 9.0; hgb 13.0; hct 41.7; mcv 93.1 plt 298; glucose 134; bun 23; creat 0.6; k+4.3; na++141 chol 143 ;ldl 83 trig 148; hgb a1c 7.0; vit b12: 805  10-10-13: wbc 8.7; hgb 13.3; hct 42.8; mcv 92; plt 257; hgb a1c 6.7     Review of Systems  Constitutional: Negative for malaise/fatigue.    Respiratory: Negative for cough and shortness of breath.   Cardiovascular: Negative for chest pain and palpitations.  Gastrointestinal: Negative for heartburn and abdominal pain.  Genitourinary: Negative for dysuria.  Musculoskeletal: Negative for joint pain and myalgias.  Skin: Negative.   Neurological: Negative for headaches.  Psychiatric/Behavioral: The patient is not nervous/anxious.     Physical Exam  Constitutional: She is oriented to person, place, and time.  thin   Neck: No tracheal deviation present. No thyromegaly present.  Cardiovascular: Normal rate, regular rhythm and intact distal pulses.   Respiratory: Effort normal and breath sounds normal. No  respiratory distress.  GI: Soft. Bowel sounds are normal. She exhibits no distension. There is no tenderness.  Musculoskeletal: She exhibits no edema.  Is unable to move any extremity is presently in bed  Lymphadenopathy:    She has no cervical adenopathy.  Neurological: She is alert and oriented to person, place, and time.  Skin: Skin is warm and dry.      ASSESSMENT/ PLAN:   MS (multiple sclerosis) Her status is  unchanged; she has no movement in any extremities;  will continue current plan of care and will monitor her status   Anemia Her anemia is stable is presently off medication and will monitor   Ulcerative colitis, unspecified She is stable will continue azulfidine 1 gm twice daily and will monitor  GERD (gastroesophageal reflux disease) Is stable will continue protonix 40 mg daily   Unspecified constipation Is stable will continue miralax twice daily   Essential hypertension, benign Is stable will continue lopressor 25 mg twice daily; asa 81 mg daily  and will monitor   Dyslipidemia Is stable will continue lipitor 20 mg three times weekly    Type II or unspecified type diabetes mellitus without mention of complication, uncontrolled Will continue  glyburide 2.5 mg  daily and will continue metformin xr 500 mg daily  Allergic rhinitis Will stop the zyrtec and will begin allegra 180 mg daily and will monitor   Anxiety state, unspecified Will continue her xanax 0.25 mg nightly as needed   Vit d deficiency Is stable; is presently not on replacement therapy at this time.   Trigeminal neuralgia  Will continue neurontin 200 mg twice daily and 100 mg in the afternoon and will monitor      Her health maintenance is up to date.    Ok Edwards NP [facility]  Contact [phone] Monday through Friday 8am- 5pm  After hours call [phone]

## 2013-12-11 ENCOUNTER — Non-Acute Institutional Stay (SKILLED_NURSING_FACILITY): Payer: Medicare Other | Admitting: Adult Health

## 2013-12-11 DIAGNOSIS — K5190 Ulcerative colitis, unspecified, without complications: Secondary | ICD-10-CM

## 2013-12-11 DIAGNOSIS — F411 Generalized anxiety disorder: Secondary | ICD-10-CM

## 2013-12-11 DIAGNOSIS — K5900 Constipation, unspecified: Secondary | ICD-10-CM

## 2013-12-11 DIAGNOSIS — IMO0001 Reserved for inherently not codable concepts without codable children: Secondary | ICD-10-CM

## 2013-12-11 DIAGNOSIS — I10 Essential (primary) hypertension: Secondary | ICD-10-CM

## 2013-12-11 DIAGNOSIS — G500 Trigeminal neuralgia: Secondary | ICD-10-CM

## 2013-12-11 DIAGNOSIS — J302 Other seasonal allergic rhinitis: Secondary | ICD-10-CM

## 2013-12-11 DIAGNOSIS — E785 Hyperlipidemia, unspecified: Secondary | ICD-10-CM

## 2013-12-11 DIAGNOSIS — J3089 Other allergic rhinitis: Secondary | ICD-10-CM

## 2013-12-11 DIAGNOSIS — G35 Multiple sclerosis: Secondary | ICD-10-CM

## 2013-12-11 DIAGNOSIS — K219 Gastro-esophageal reflux disease without esophagitis: Secondary | ICD-10-CM

## 2013-12-11 DIAGNOSIS — E1165 Type 2 diabetes mellitus with hyperglycemia: Secondary | ICD-10-CM

## 2014-01-01 ENCOUNTER — Encounter: Payer: Self-pay | Admitting: Adult Health

## 2014-01-01 MED ORDER — GABAPENTIN 100 MG PO CAPS: 300.0000 mg | ORAL_CAPSULE | Freq: Three times a day (TID) | ORAL | Status: DC

## 2014-01-01 NOTE — Progress Notes (Signed)
Patient ID: [name], female   DOB: Nov 28, 1934, 79 y.o.   MRN: [id number]     [facility]  Allergies  Allergen Reactions  . Contrast Media [Iodinated Diagnostic Agents]     Unknown  . Penicillins     Unknown     Chief Complaint  Patient presents with  . Medical Management of Chronic Issues    HPI:  She is being seen for the management of her chronic illnesses. There are no concerns being voiced by the nursing staff. She is complaining increased ear wax in both ears and has increased facial itching and pain states her neurontin dose is not high enough.    Past Medical History  Diagnosis Date  . Hypertension   . Diabetes mellitus   . GERD (gastroesophageal reflux disease)   . Neuromuscular disorder   . Multiple sclerosis   . Anxiety   . Hyperlipemia   . Pneumonia   . Colitis   . Unable to ambulate     Past Surgical History  Procedure Laterality Date  . Tubal ligation    . Flexible sigmoidoscopy  10/05/2011    Procedure: FLEXIBLE SIGMOIDOSCOPY;  Surgeon: Winfield Cunas., MD;  Location: [facility];  Service: Endoscopy;  Laterality: N/A;   pt. coming from [place] daughter phone no. [phone] might come with her  sedation is needed, pt. is paraplegic  . Esophagogastroduodenoscopy N/A 09/09/2012    Procedure: ESOPHAGOGASTRODUODENOSCOPY (EGD);  Surgeon: Winfield Cunas., MD;  Location: [facility] ENDOSCOPY;  Service: Endoscopy;  Laterality: N/A;  . Colonoscopy N/A 09/09/2012    Procedure: COLONOSCOPY;  Surgeon: Winfield Cunas., MD;  Location: [facility] ENDOSCOPY;  Service: Endoscopy;  Laterality: N/A;  possible flex sig    VITAL SIGNS BP 93/62  Pulse 85  Ht 5\' 4"  (1.626 m)  Wt 134 lb 14.4 oz (61.19 kg)  BMI 23.14 kg/m2  SpO2 95%   Patient's Medications  New Prescriptions   No medications on file  Previous Medications   ALPRAZOLAM (XANAX) 0.25 MG TABLET    Take 0.25 mg by mouth at bedtime as needed for sleep.   ASPIRIN 81 MG TABLET     Take 81 mg by mouth daily.   ATORVASTATIN (LIPITOR) 20 MG TABLET    Take 20 mg by mouth See admin instructions. Takes 1 tablet daily on mondays, Wednesdays, and fridays.   FEXOFENADINE (ALLEGRA ALLERGY) 180 MG TABLET    Take 1 tablet (180 mg total) by mouth daily.   GABAPENTIN (NEURONTIN) 100 MG CAPSULE    Take 100-200 mg by mouth 3 (three) times daily. 200 mg twice daily and 100 mg in the afternoon   GLYBURIDE (DIABETA) 2.5 MG TABLET    Take 2.5 mg by mouth 2 (two) times daily with a meal.   IBUPROFEN (ADVIL,MOTRIN) 800 MG TABLET    Take 1 tablet (800 mg total) by mouth every 6 (six) hours as needed.   METFORMIN (GLUCOPHAGE XR) 500 MG 24 HR TABLET    Take 1 tablet (500 mg total) by mouth daily with breakfast.   METOPROLOL TARTRATE (LOPRESSOR) 25 MG TABLET    Take 25 mg by mouth 2 (two) times daily.   PANTOPRAZOLE (PROTONIX) 40 MG TABLET    Take 40 mg by mouth daily.   POLYETHYLENE GLYCOL (MIRALAX / GLYCOLAX) PACKET    Take 17 g by mouth 2 (two) times daily.   POLYVINYL ALCOHOL (LIQUIFILM TEARS) 1.4 % OPHTHALMIC SOLUTION    Place 1 drop into  both eyes 3 (three) times daily as needed (dry eyes).   SULFADIAZINE 500 MG TABLET    Take 1,000 mg by mouth 2 (two) times daily.   VITAMIN B-12 100 MCG TABLET    Take 1 tablet (100 mcg total) by mouth daily.  Modified Medications   No medications on file  Discontinued Medications   No medications on file    SIGNIFICANT DIAGNOSTIC EXAMS  10-31-12: chest x-ray: borderline cardiomegaly with change with new minimal pulmonary vascular congestion no pleural effusion. Mild bilateral atelectasis appears worse on left and new on right. Co-existing interstitial pneumonitis cannot be excluded at lung bases. No inflammatory consolidate or nodule. Mild bronchitis at perihilar regions.   07-11-13: chest x-ray; borderline heart size and pulmonary vasculature; modeerate to prominent changes of bronchitis to both mid and lower lungs.     LABS REVIEWED:   03-13-13: wbc  6.5; hgb 14.2; hct 44.6 ;mcv 90.8 plt 221; glucose 113; bun 20; creat 0.4; k+4.1; na++140 chol 150; ldl 78; trig 200; hgb a1c 5.9; vit b12: 710; vit d 64.10  06-14-13: u/a: neg 07-09-13:rapid flu: neg  07-12-13: urine culture: levaquin  08-05-13: wbc 7.8;hgb 13.8; hct 43.3; mcv 91.4 ;plt 263; hgb a1c 6.7  09-10-13: wbc 9.0; hgb 13.0; hct 41.7; mcv 93.1 plt 298; glucose 134; bun 23; creat 0.6; k+4.3; na++141 chol 143 ;ldl 83 trig 148; hgb a1c 7.0; vit b12: 805  10-10-13: wbc 8.7; hgb 13.3; hct 42.8; mcv 92; plt 257; hgb a1c 6.7     Review of Systems  Constitutional: Negative for malaise/fatigue.   complain of increased ear wax and increased facial pain and itching  Respiratory: Negative for cough and shortness of breath.   Cardiovascular: Negative for chest pain and palpitations.  Gastrointestinal: Negative for heartburn and abdominal pain.  Genitourinary: Negative for dysuria.  Musculoskeletal: Negative for joint pain and myalgias.  Skin: Negative.   Neurological: Negative for headaches.  Psychiatric/Behavioral: The patient is not nervous/anxious.     Physical Exam  Constitutional: She is oriented to person, place, and time.  Thin Has bilateral cerumen impaction    Neck: No tracheal deviation present. No thyromegaly present.  Cardiovascular: Normal rate, regular rhythm and intact distal pulses.   Respiratory: Effort normal and breath sounds normal. No respiratory distress.  GI: Soft. Bowel sounds are normal. She exhibits no distension. There is no tenderness.  Musculoskeletal: She exhibits no edema.  Is unable to move any extremity is presently in bed  Lymphadenopathy:    She has no cervical adenopathy.  Neurological: She is alert and oriented to person, place, and time.  Skin: Skin is warm and dry.      ASSESSMENT/ PLAN:   1. MS (multiple sclerosis) She is without change in her status; will continue her current plan of care and will monitor her status.    2. Anemia Her anemia  is stable is presently off medication and will monitor   3. Ulcerative colitis, unspecified She is stable will continue azulfidine 1 gm twice daily and will monitor  4. GERD (gastroesophageal reflux disease) Is stable will continue protonix 40 mg daily   5. Unspecified constipation Is stable will continue miralax twice daily   6. Essential hypertension, benign Is stable will continue lopressor 25 mg twice daily; asa 81 mg daily  and will monitor   7. Dyslipidemia Is stable will continue lipitor 20 mg three times weekly    8. Type II or unspecified type diabetes mellitus without mention of complication, uncontrolled  Will continue  glyburide 2.5 mg  daily and will continue metformin xr 500 mg daily  9. Allergic rhinitis Will  continue allegra 180 mg daily and will monitor   10. Anxiety state, unspecified Will continue her xanax 0.25 mg nightly as needed   11. Vit d deficiency Is stable; is presently not on replacement therapy at this time.   12. Trigeminal neuralgia associated with MS Will increase her neurontin to 300 mg three times daily and will monitor her status.       Ok Edwards NP [facility]  Contact [phone] Monday through Friday 8am- 5pm  After hours call [phone]

## 2014-01-16 ENCOUNTER — Non-Acute Institutional Stay (SKILLED_NURSING_FACILITY): Payer: Medicare Other | Admitting: Adult Health

## 2014-01-16 ENCOUNTER — Encounter: Payer: Self-pay | Admitting: Adult Health

## 2014-01-16 DIAGNOSIS — E1165 Type 2 diabetes mellitus with hyperglycemia: Secondary | ICD-10-CM

## 2014-01-16 DIAGNOSIS — G35 Multiple sclerosis: Secondary | ICD-10-CM

## 2014-01-16 DIAGNOSIS — G500 Trigeminal neuralgia: Secondary | ICD-10-CM

## 2014-01-16 DIAGNOSIS — K219 Gastro-esophageal reflux disease without esophagitis: Secondary | ICD-10-CM

## 2014-01-16 DIAGNOSIS — K5900 Constipation, unspecified: Secondary | ICD-10-CM

## 2014-01-16 DIAGNOSIS — I10 Essential (primary) hypertension: Secondary | ICD-10-CM

## 2014-01-16 DIAGNOSIS — D649 Anemia, unspecified: Secondary | ICD-10-CM

## 2014-01-16 DIAGNOSIS — IMO0001 Reserved for inherently not codable concepts without codable children: Secondary | ICD-10-CM

## 2014-01-16 DIAGNOSIS — J309 Allergic rhinitis, unspecified: Secondary | ICD-10-CM

## 2014-01-16 DIAGNOSIS — K5190 Ulcerative colitis, unspecified, without complications: Secondary | ICD-10-CM

## 2014-01-16 NOTE — Progress Notes (Signed)
Patient ID: [name], female   DOB: May 21, 1935, 79 y.o.   MRN: [id number]    Full code  [facility]  Allergies  Allergen Reactions  . Contrast Media [Iodinated Diagnostic Agents]     Unknown  . Penicillins     Unknown     Chief Complaint  Patient presents with  . Medical Management of Chronic Issues    HPI:  This is a 79 y.o. Female with a hx of severe MS and quadriplegia. She is being seen for the management of her chronic illnesses. There are no reported issues per the staff. Her weight has increased by 4lb over the past two months to 140lbs. She reports a cough with white sputum that has been present for a couple of months. She says that it is not associated with meals. She denies a fever or body aches. She also reports intermittent ear aches on the right. She was treated for excessive wax in her ears last month. She also reports that she has itching on her face on a regular basis. She states that the neurontin helps but that the last administration of the day is at 5p and by the morning she is having an uncomfortable itching on her face.    Past Medical History  Diagnosis Date  . Hypertension   . Diabetes mellitus   . GERD (gastroesophageal reflux disease)   . Neuromuscular disorder   . Multiple sclerosis   . Anxiety   . Hyperlipemia   . Pneumonia   . Colitis   . Unable to ambulate   . GI bleed 09/07/2012    Past Surgical History  Procedure Laterality Date  . Tubal ligation    . Flexible sigmoidoscopy  10/05/2011    Procedure: FLEXIBLE SIGMOIDOSCOPY;  Surgeon: Winfield Cunas., MD;  Location: [facility];  Service: Endoscopy;  Laterality: N/A;   pt. coming from [place] daughter phone no. [phone] might come with her  sedation is needed, pt. is paraplegic  . Esophagogastroduodenoscopy N/A 09/09/2012    Procedure: ESOPHAGOGASTRODUODENOSCOPY (EGD);  Surgeon: Winfield Cunas., MD;  Location: [facility] ENDOSCOPY;  Service: Endoscopy;   Laterality: N/A;  . Colonoscopy N/A 09/09/2012    Procedure: COLONOSCOPY;  Surgeon: Winfield Cunas., MD;  Location: [facility] ENDOSCOPY;  Service: Endoscopy;  Laterality: N/A;  possible flex sig    VITAL SIGNS BP 124/71  Pulse 74  Temp(Src) 97.6 F (36.4 C)  Resp 20  Wt 140 lb (63.504 kg)  SpO2 94%   Patient's Medications  New Prescriptions   No medications on file  Previous Medications   ALPRAZOLAM (XANAX) 0.25 MG TABLET    Take 0.25 mg by mouth at bedtime as needed for sleep.   ASPIRIN 81 MG TABLET    Take 81 mg by mouth daily.   ATORVASTATIN (LIPITOR) 20 MG TABLET    Take 20 mg by mouth See admin instructions. Takes 1 tablet daily on mondays, Wednesdays, and fridays.   FEXOFENADINE (ALLEGRA ALLERGY) 180 MG TABLET    Take 1 tablet (180 mg total) by mouth daily.   FLUTICASONE (FLONASE) 50 MCG/ACT NASAL SPRAY    Place 2 sprays into both nostrils daily.   GABAPENTIN (NEURONTIN) 100 MG CAPSULE    Take 3 capsules (300 mg total) by mouth 3 (three) times daily.   GLYBURIDE (DIABETA) 2.5 MG TABLET    Take 2.5 mg by mouth 2 (two) times daily with a meal.   IBUPROFEN (ADVIL,MOTRIN) 800 MG TABLET  Take 1 tablet (800 mg total) by mouth every 6 (six) hours as needed.   METFORMIN (GLUCOPHAGE XR) 500 MG 24 HR TABLET    Take 1 tablet (500 mg total) by mouth daily with breakfast.   METOPROLOL TARTRATE (LOPRESSOR) 25 MG TABLET    Take 25 mg by mouth 2 (two) times daily.   PANTOPRAZOLE (PROTONIX) 40 MG TABLET    Take 40 mg by mouth daily.   POLYETHYLENE GLYCOL (MIRALAX / GLYCOLAX) PACKET    Take 17 g by mouth 2 (two) times daily.   POLYVINYL ALCOHOL (LIQUIFILM TEARS) 1.4 % OPHTHALMIC SOLUTION    Place 1 drop into both eyes 3 (three) times daily as needed (dry eyes).   SULFADIAZINE 500 MG TABLET    Take 1,000 mg by mouth 2 (two) times daily.   VITAMIN B-12 100 MCG TABLET    Take 1 tablet (100 mcg total) by mouth daily.  Modified Medications   No medications on file  Discontinued Medications   No  medications on file    SIGNIFICANT DIAGNOSTIC EXAMS  10-31-12: chest x-ray: borderline cardiomegaly with change with new minimal pulmonary vascular congestion no pleural effusion. Mild bilateral atelectasis appears worse on left and new on right. Co-existing interstitial pneumonitis cannot be excluded at lung bases. No inflammatory consolidate or nodule. Mild bronchitis at perihilar regions.   07-11-13: chest x-ray; borderline heart size and pulmonary vasculature; modeerate to prominent changes of bronchitis to both mid and lower lungs.     LABS REVIEWED:   03-13-13: wbc 6.5; hgb 14.2; hct 44.6 ;mcv 90.8 plt 221; glucose 113; bun 20; creat 0.4; k+4.1; na++140 chol 150; ldl 78; trig 200; hgb a1c 5.9; vit b12: 710; vit d 64.10  06-14-13: u/a: neg 07-09-13:rapid flu: neg  07-12-13: urine culture: levaquin  08-05-13: wbc 7.8;hgb 13.8; hct 43.3; mcv 91.4 ;plt 263; hgb a1c 6.7  09-10-13: wbc 9.0; hgb 13.0; hct 41.7; mcv 93.1 plt 298; glucose 134; bun 23; creat 0.6; k+4.3; na++141 chol 143 ;ldl 83 trig 148; hgb a1c 7.0; vit b12: 805  10-10-13: wbc 8.7; hgb 13.3; hct 42.8; mcv 92; plt 257; hgb a1c 6.7   ROS  DATA OBTAINED: from patient, nurse, medical record GENERAL: Feels well  No recent fever, fatigue, change in activity status, appetite, or weight  EYES: NO change in vision, eye drainage or pain. NOSE: Denies congestion or runny nose RESPIRATORY: No SOB or wheezing. Cough with clear or white sputum noted not associated with meals. CARDIAC: No chest pain, palpitations. No edema GI: No abdominal pain  No Nausea,vomiting,diarrhea or constipation  No heartburn or reflux  MUSCULOSKELETAL: quadriplegia with contractures NEUROLOGIC: No dizziness, fainting, headache, numbness No change in mental status. Facial itching PSYCHIATRIC: No feelings of anxiety, depression  Sleeps well intermittently.  No behavior issue  Physical Exam  GENERAL APPEARANCE: No acute distress, appropriately groomed, normal body  habitus. Alert, pleasant, conversant. SKIN: No diaphoresis, rash, unusual lesions, wounds HEAD: Normocephalic, atraumatic EYES: Conjunctiva/lids clear. Pupils round, reactive. EOMs intact.  EARS: External exam WNL, impacted cerumen. Hearing grossly normal. NOSE: No deformity or discharge. Pale mucous membranes MOUTH/THROAT: Lips w/o lesions. Oral mucosa, tongue moist, w/o lesion. Oropharynx w/o redness or lesions.  NECK: Supple, full ROM. No thyroid tenderness, enlargement or nodule LYMPHATICS: No head, neck or supraclavicular adenopathy RESPIRATORY: Breathing is even, unlabored. Lung sounds are clear but with decreased bases CARDIOVASCULAR: Heart RRR. No murmur or extra heart sounds  ARTERIAL: No carotid, aortic or femoral bruit. Carotid, Femoral, Popliteal, DP,PT  pulse 2+.  VENOUS: No varicosities. No venous stasis skin changes  EDEMA: No peripheral edema. No ascites GASTROINTESTINAL: Abdomen is soft, non-tender, not distended w/ normal bowel sounds. No hepatic or splenic enlargement.  GENITOURINARY: Bladder non tender, not distended. MUSCULOSKELETAL: Arms and legs contracted with muscle atrophy and foot drop bilaterally NEUROLOGIC: Oriented to time, place, person. Cranial nerves 2-12 grossly intact, speech clear PSYCHIATRIC: Mood and affect appropriate to situation   ASSESSMENT/ PLAN:   1. MS (multiple sclerosis) She is without change in her status; will continue her current plan of care and will monitor her status.    2. Anemia Her anemia is stable on B12. Continue to monitor.   3. Ulcerative colitis, unspecified She is stable will continue azulfidine 1 gm twice daily and will monitor. Will monitor CMP periodically.  4. GERD (gastroesophageal reflux disease) Is stable will continue protonix 40 mg daily   5. Unspecified constipation Is stable will continue miralax twice daily   6. Essential hypertension, benign BP at goal. will continue lopressor 25 mg twice daily; asa 81  mg daily  and will monitor   7. Dyslipidemia LDL is at goal at 83. Is stable will continue lipitor 20 mg three times weekly    8. Type II or unspecified type diabetes mellitus without mention of complication, uncontrolled  A1C is at goal. Will continue  glyburide 2.5 mg  daily and will continue metformin xr 500 mg daily  9. Allergic rhinitis She does not appear to be ill. Given her assessment and hx I suspect she has a post nasal drop causing this issue. Will begin Flonase 2 sprays each nostril QD and f/u  10.  Trigeminal neuralgia associated with MS Will continue her neurontin to 300 mg three times daily but change administration time to 9am 2pm and 9pm per her request  11. Cerumen Impaction Attempted to disimpact the cerumen from her ears but she was very sensitive to this. May try irrigation instead      Ok Edwards NP/Christina Melvyn Novas RN, MSN [facility]  Contact [phone] Monday through Friday 8am- 5pm  After hours call [phone]

## 2014-02-10 ENCOUNTER — Non-Acute Institutional Stay (SKILLED_NURSING_FACILITY): Payer: Medicare Other | Admitting: Internal Medicine

## 2014-02-10 ENCOUNTER — Encounter: Payer: Self-pay | Admitting: Internal Medicine

## 2014-02-10 DIAGNOSIS — K5190 Ulcerative colitis, unspecified, without complications: Secondary | ICD-10-CM

## 2014-02-10 DIAGNOSIS — I10 Essential (primary) hypertension: Secondary | ICD-10-CM

## 2014-02-10 DIAGNOSIS — H6120 Impacted cerumen, unspecified ear: Secondary | ICD-10-CM

## 2014-02-10 DIAGNOSIS — G500 Trigeminal neuralgia: Secondary | ICD-10-CM

## 2014-02-10 DIAGNOSIS — E118 Type 2 diabetes mellitus with unspecified complications: Secondary | ICD-10-CM

## 2014-02-10 DIAGNOSIS — H6123 Impacted cerumen, bilateral: Secondary | ICD-10-CM

## 2014-02-10 NOTE — Progress Notes (Signed)
Patient ID: [name], female   DOB: 1934-07-26, 79 y.o.   MRN: [id number]    Facility: [facility]   Chief Complaint  Patient presents with  . Medical Management of Chronic Issues    routine follow up    Allergies  Allergen Reactions  . Contrast Media [Iodinated Diagnostic Agents]     Unknown  . Penicillins     Unknown   Code- full code  HPI 79 y/o female patent is seen today for routine visit. She is a long term care resident with history of quadriplegia from her MS, DM, ulcerative colitis, anemia, HTN, HL among others. She is bed bound and under total care. Her facial pain is better. Continues to bring up mucus. Complaints of her left ear hurting. Mentions she was supposed to get her ear cleaned but has not had it yet. Denies discharge from ear. No runny nose or sore throat. occassional headaches  Review of Systems  Constitutional: Negative for fever, chills, malaise/fatigue and diaphoresis.  HENT: Negative for hearing loss and sore throat.   Eyes: Negative for blurred vision, double vision and discharge.  Respiratory: Negative for shortness of breath and wheezing.   Cardiovascular: Negative for chest pain, palpitations, orthopnea and leg swelling.  Gastrointestinal: Negative for heartburn, nausea, vomiting, abdominal pain, diarrhea Genitourinary: Negative for dysuria Musculoskeletal: Negative for joint pain and myalgias.  Skin: Negative for itching and rash.  Neurological: Negative for dizziness, tingling, headaches.  Psychiatric/Behavioral: Negative for depression and memory loss. The patient is not nervous/anxious.    Past Medical History  Diagnosis Date  . Hypertension   . Diabetes mellitus   . GERD (gastroesophageal reflux disease)   . Neuromuscular disorder   . Multiple sclerosis   . Anxiety   . Hyperlipemia   . Pneumonia   . Colitis   . Unable to ambulate   . GI bleed 09/07/2012   Current Outpatient Prescriptions on File Prior to  Visit  Medication Sig Dispense Refill  . ALPRAZolam (XANAX) 0.25 MG tablet Take 0.25 mg by mouth at bedtime as needed for sleep.      Marland Kitchen aspirin 81 MG tablet Take 81 mg by mouth daily.      Marland Kitchen atorvastatin (LIPITOR) 20 MG tablet Take 20 mg by mouth See admin instructions. Takes 1 tablet daily on mondays, Wednesdays, and fridays.      . fexofenadine (ALLEGRA ALLERGY) 180 MG tablet Take 1 tablet (180 mg total) by mouth daily.  30 tablet  11  . fluticasone (FLONASE) 50 MCG/ACT nasal spray Place 2 sprays into both nostrils daily.      Marland Kitchen gabapentin (NEURONTIN) 100 MG capsule Take 3 capsules (300 mg total) by mouth 3 (three) times daily.  90 capsule  11  . glyBURIDE (DIABETA) 2.5 MG tablet Take 2.5 mg by mouth 2 (two) times daily with a meal.      . ibuprofen (ADVIL,MOTRIN) 800 MG tablet Take 1 tablet (800 mg total) by mouth every 6 (six) hours as needed.  30 tablet  12  . metFORMIN (GLUCOPHAGE XR) 500 MG 24 hr tablet Take 1 tablet (500 mg total) by mouth daily with breakfast.  30 tablet  11  . metoprolol tartrate (LOPRESSOR) 25 MG tablet Take 25 mg by mouth 2 (two) times daily.      . pantoprazole (PROTONIX) 40 MG tablet Take 40 mg by mouth daily.      . polyethylene glycol (MIRALAX / GLYCOLAX) packet Take 17 g by mouth  2 (two) times daily.  14 each  11  . polyvinyl alcohol (LIQUIFILM TEARS) 1.4 % ophthalmic solution Place 1 drop into both eyes 3 (three) times daily as needed (dry eyes).      Marland Kitchen sulfaDIAZINE 500 MG tablet Take 1,000 mg by mouth 2 (two) times daily.      . vitamin B-12 100 MCG tablet Take 1 tablet (100 mcg total) by mouth daily.  30 tablet  0   No current facility-administered medications on file prior to visit.   Physical exam BP 118/70  Pulse 72  Temp(Src) 98 F (36.7 C)  Resp 16  SpO2 95%  General- elderly female in no acute distress Head- atraumatic, normocephalic Eyes- no pallor, no icterus, no discharge Ears- both ear canal impacted with cerumen Neck- no  lymphadenopathy Mouth- normal mucus membrane Cardiovascular- normal s1,s2, no murmurs, normal distal pulses Respiratory- bilateral clear to auscultation with decreased air entry at bases, no wheeze, no rhonchi, no crackles Abdomen- bowel sounds present, soft, non tender Musculoskeletal- contracted extremities, b/l foot drop, no leg edema, quadriplegia Neurological- no new focal deficit Skin- warm and dry Psychiatry- alert and oriented to person, place and time, normal mood and affect  LABS REVIEWED:    03-13-13: wbc 6.5; hgb 14.2; hct 44.6 ;mcv 90.8 plt 221; glucose 113; bun 20; creat 0.4; k+4.1; na++140 chol 150; ldl 78; trig 200; hgb a1c 5.9; vit b12: 710; vit d 64.10   06-14-13: u/a: neg 07-09-13:rapid flu: neg   07-12-13: urine culture: levaquin   08-05-13: wbc 7.8;hgb 13.8; hct 43.3; mcv 91.4 ;plt 263; hgb a1c 6.7   09-10-13: wbc 9.0; hgb 13.0; hct 41.7; mcv 93.1 plt 298; glucose 134; bun 23; creat 0.6; k+4.3; na++141 chol 143 ;ldl 83 trig 148; hgb a1c 7.0; vit b12: 805   10-10-13: wbc 8.7; hgb 13.3; hct 42.8; mcv 92; plt 257; hgb a1c 6.7    Assessment/plan  Impacted cerumen Debrox ear drop for 3 days followed by ear lavage, discussed with nursing supervisor  Dm type 2 A1C is at goal. continue metformin 500 mg daily and glyburide 2.5 mg  daily. Continue lipitor  Ulcerative colitis Stable, no recent flare up. Continue azulfidine 1g bid and protonix. On miralx for her constipation  HTN BP at goal. will continue lopressor 25 mg twice daily and baby aspirin  Trigeminal neuralgia  Stable. Continue neurontin 300 mg three times daily

## 2014-03-13 ENCOUNTER — Non-Acute Institutional Stay: Payer: Medicare Other | Admitting: Adult Health Nurse Practitioner

## 2014-03-13 ENCOUNTER — Non-Acute Institutional Stay (SKILLED_NURSING_FACILITY): Payer: Medicare Other | Admitting: Adult Health

## 2014-03-13 ENCOUNTER — Encounter: Payer: Self-pay | Admitting: Adult Health Nurse Practitioner

## 2014-03-13 DIAGNOSIS — D649 Anemia, unspecified: Secondary | ICD-10-CM

## 2014-03-13 DIAGNOSIS — IMO0001 Reserved for inherently not codable concepts without codable children: Secondary | ICD-10-CM

## 2014-03-13 DIAGNOSIS — K5190 Ulcerative colitis, unspecified, without complications: Secondary | ICD-10-CM

## 2014-03-13 DIAGNOSIS — E785 Hyperlipidemia, unspecified: Secondary | ICD-10-CM

## 2014-03-13 DIAGNOSIS — I10 Essential (primary) hypertension: Secondary | ICD-10-CM

## 2014-03-13 DIAGNOSIS — Z0000 Encounter for general adult medical examination without abnormal findings: Secondary | ICD-10-CM

## 2014-03-13 DIAGNOSIS — G500 Trigeminal neuralgia: Secondary | ICD-10-CM

## 2014-03-13 DIAGNOSIS — E1165 Type 2 diabetes mellitus with hyperglycemia: Secondary | ICD-10-CM

## 2014-03-13 DIAGNOSIS — G35 Multiple sclerosis: Secondary | ICD-10-CM

## 2014-03-13 LAB — CBC AND DIFFERENTIAL
HCT: 42 % (ref 36–46)
Hemoglobin: 12.6 g/dL (ref 12.0–16.0)
Platelets: 257 10*3/uL (ref 150–399)
WBC: 7.4 10^3/mL

## 2014-03-13 LAB — LIPID PANEL
Cholesterol: 137 mg/dL (ref 0–200)
LDL Cholesterol: 72 mg/dL
Triglycerides: 178 mg/dL — AB (ref 40–160)

## 2014-03-13 LAB — BASIC METABOLIC PANEL
BUN: 33 mg/dL — AB (ref 4–21)
CREATININE: 0.7 mg/dL (ref 0.5–1.1)
GLUCOSE: 132 mg/dL
POTASSIUM: 4.9 mmol/L (ref 3.4–5.3)
Sodium: 134 mmol/L — AB (ref 137–147)

## 2014-03-13 LAB — HEMOGLOBIN A1C: HEMOGLOBIN A1C: 6.7 % — AB (ref 4.0–6.0)

## 2014-03-13 NOTE — Progress Notes (Addendum)
PCP: Blanchie Serve, MD  Code Status: Full  Allergies  Allergen Reactions  . Contrast Media [Iodinated Diagnostic Agents]     Unknown  . Penicillins     Unknown    Chief Complaint: Medical management of chronic issues  HPI:  [name] is a pleasant 79yr old female who is being seen today for medical management of chronic issues and medications. She has a extensive history of HTN, HLD, GERD, MS, Quadriplegic, and DM.  No new issues reported from staff. Pt is awake in bed. She reports no new concerns, however is still having chronic issues with a wet cough. This is primarily due to her quadriplegia. She states it is better since her last visit in July, but she is still having issues some days. She denies choking, difficulty swallowing, shortness of breath, or related to eating or drinking. She describes her secretions as clear/white sometimes thickened. She is able cough the secretions up, but sometimes has difficulty spitting them out due to being to thick. She denies chest pain, shortness of breath, difficulty breathing or pain. She does endorse her ears feel better from previous visit and her facial itching has improved.     Review of Systems:  ROS  DATA OBTAINED: from patient, nurse, medical record  GENERAL: Feels well No recent fever, fatigue, change in activity status, appetite, or weight  EYES: NO change in vision, eye drainage or pain.  NOSE: Denies congestion or runny nose  RESPIRATORY: Congested cough with clear/white sputum production.  No SOB or wheezing or difficulty breathing. CARDIAC: No chest pain, palpitations. No edema  GI: No abdominal pain No Nausea,vomiting,diarrhea or constipation No heartburn or reflux  MUSCULOSKELETAL: quadriplegia with contractures  NEUROLOGIC: Facial itching, improving.  No dizziness, fainting, headache, numbness No change in mental status.  PSYCHIATRIC: No feelings of anxiety, depression Sleeps well intermittently. No behavior  issue   Past Medical History  Diagnosis Date  . Hypertension   . Diabetes mellitus   . GERD (gastroesophageal reflux disease)   . Neuromuscular disorder   . Multiple sclerosis   . Anxiety   . Hyperlipemia   . Pneumonia   . Colitis   . Unable to ambulate   . GI bleed 09/07/2012   Past Surgical History  Procedure Laterality Date  . Tubal ligation    . Flexible sigmoidoscopy  10/05/2011    Procedure: FLEXIBLE SIGMOIDOSCOPY;  Surgeon: Winfield Cunas., MD;  Location: [facility];  Service: Endoscopy;  Laterality: N/A;   pt. coming from [place] daughter phone no. [phone] might come with her  sedation is needed, pt. is paraplegic  . Esophagogastroduodenoscopy N/A 09/09/2012    Procedure: ESOPHAGOGASTRODUODENOSCOPY (EGD);  Surgeon: Winfield Cunas., MD;  Location: [facility] ENDOSCOPY;  Service: Endoscopy;  Laterality: N/A;  . Colonoscopy N/A 09/09/2012    Procedure: COLONOSCOPY;  Surgeon: Winfield Cunas., MD;  Location: [facility] ENDOSCOPY;  Service: Endoscopy;  Laterality: N/A;  possible flex sig   Social History:   reports that she quit smoking about 18 years ago. She has never used smokeless tobacco. She reports that she does not drink alcohol or use illicit drugs.  Family History  Problem Relation Age of Onset  . CAD Mother   . Diabetes Daughter   . Diabetes Daughter   . Diabetes Son     Medications:   Medication List       This list is accurate as of: 03/13/14  9:27 AM.  Always use  your most recent med list.               ALPRAZolam 0.25 MG tablet  Commonly known as:  XANAX  Take 0.25 mg by mouth at bedtime as needed for sleep.     aspirin 81 MG tablet  Take 81 mg by mouth daily.     atorvastatin 20 MG tablet  Commonly known as:  LIPITOR  Take 20 mg by mouth See admin instructions. Takes 1 tablet daily on mondays, Wednesdays, and fridays.     cyanocobalamin 100 MCG tablet  Take 1 tablet (100 mcg total) by mouth daily.      fexofenadine 180 MG tablet  Commonly known as:  ALLEGRA ALLERGY  Take 1 tablet (180 mg total) by mouth daily.     fluticasone 50 MCG/ACT nasal spray  Commonly known as:  FLONASE  Place 2 sprays into both nostrils daily.     gabapentin 100 MG capsule  Commonly known as:  NEURONTIN  Take 3 capsules (300 mg total) by mouth 3 (three) times daily.     glyBURIDE 2.5 MG tablet  Commonly known as:  DIABETA  Take 2.5 mg by mouth 2 (two) times daily with a meal.     ibuprofen 800 MG tablet  Commonly known as:  ADVIL,MOTRIN  Take 1 tablet (800 mg total) by mouth every 6 (six) hours as needed.     metFORMIN 500 MG 24 hr tablet  Commonly known as:  GLUCOPHAGE XR  Take 1 tablet (500 mg total) by mouth daily with breakfast.     metoprolol tartrate 25 MG tablet  Commonly known as:  LOPRESSOR  Take 25 mg by mouth 2 (two) times daily.     pantoprazole 40 MG tablet  Commonly known as:  PROTONIX  Take 40 mg by mouth daily.     polyethylene glycol packet  Commonly known as:  MIRALAX / GLYCOLAX  Take 17 g by mouth 2 (two) times daily.     polyvinyl alcohol 1.4 % ophthalmic solution  Commonly known as:  LIQUIFILM TEARS  Place 1 drop into both eyes 3 (three) times daily as needed (dry eyes).     sulfaDIAZINE 500 MG tablet  Take 1,000 mg by mouth 2 (two) times daily.          Physical Exam:  Filed Vitals:   03/13/14 0911  BP: 136/72  Pulse: 82  Temp: 97.6 F (36.4 C)  Resp: 18  Weight: 139 lb (63.05 kg)  SpO2: 97%    General- elderly female in no acute distress Head- atraumatic, normocephalic Eyes- PERRLA, EOMI, no pallor, no icterus, no discharge Neck- no lymphadenopathy, no thyromegaly, no jugular vein distension, no carotid bruit Ears- left ear normal tympanic membrane and normal external ear canal , right ear normal tympanic membrane and normal external ear canal Throat- moist mucus membrane, normal oropharynx Nose- normal nasal mucosa, no maxillary or frontal sinus  tenderness Chest- no chest wall deformities, no chest wall tenderness Cardiovascular- normal s1,s2, no murmurs/ rubs/ gallops Respiratory- bilateral clear to auscultation, no wheeze, no rhonchi, no crackles, no use of accessory muscles Abdomen- bowel sounds present, soft, non tender, no organomegaly, no abdominal bruits, no guarding or rigidity, no CVA tenderness Musculoskeletal- quadriplegia; no movement to any extremities. Arms and legs contracted with muscle atrophy and foot drop bilaterally Neurological- clear speech; no reflexes; CN intact Skin- warm and dry Psychiatry- alert and oriented to person, place and time, normal mood and affect    SIGNIFICANT  DIAGNOSTIC EXAMS  10-31-12: chest x-ray: borderline cardiomegaly with change with new minimal pulmonary vascular congestion no pleural effusion. Mild bilateral atelectasis appears worse on left and new on right. Co-existing interstitial pneumonitis cannot be excluded at lung bases. No inflammatory consolidate or nodule. Mild bronchitis at perihilar regions.   07-11-13: chest x-ray; borderline heart size and pulmonary vasculature; modeerate to prominent changes of bronchitis to both mid and lower lungs.     LABS REVIEWED:   03-13-13: wbc 6.5; hgb 14.2; hct 44.6 ;mcv 90.8 plt 221; glucose 113; bun 20; creat 0.4; k+4.1; na++140 chol 150; ldl 78; trig 200; hgb a1c 5.9; vit b12: 710;  vit d 64.10  06-14-13: u/a: neg 07-09-13:rapid flu: neg  07-12-13: urine culture: levaquin  08-05-13: wbc 7.8;hgb 13.8; hct 43.3; mcv 91.4 ;plt 263; hgb a1c 6.7  09-10-13: wbc 9.0; hgb 13.0; hct 41.7; mcv 93.1 plt 298; glucose 134; bun 23; creat 0.6; k+4.3; na++141 chol 143 ;ldl 83 trig 148; hgb a1c 7.0; vit b12: 805  10-10-13: wbc 8.7; hgb 13.3; hct 42.8; mcv 92; plt 257; hgb a1c 6.7   Assessment/Plan 1. MS (multiple sclerosis) She is without change in her status; will continue her current plan of care and will monitor her status.    2. Anemia Her anemia is stable  on B12. Continue to monitor.   3. Ulcerative colitis, unspecified She is stable will continue azulfidine 1 gm twice daily and will monitor. Will monitor CMP periodically.  4. GERD (gastroesophageal reflux disease) Is stable will continue protonix 40 mg daily   5. Unspecified constipation Is stable will continue miralax twice daily   6. Essential hypertension, benign BP at goal. will continue lopressor 25 mg twice daily; asa 81 mg daily  and will monitor   7. Dyslipidemia LDL is at goal at 83. Is stable will continue lipitor 20 mg three times weekly    8. Type II or unspecified type diabetes mellitus without mention of complication, uncontrolled  A1C is at goal. Will continue  glyburide 2.5 mg  daily and will continue metformin xr 500 mg daily Blood sugars stable in 140-150s  9.  Trigeminal neuralgia associated with MS This is much better since Neurontin 300mg  TID times changed; facial itching much improved.  10. Cough This is an on-going concern. Will try Robitussin 74ml every 6 hours prn as needed cough   11. Cerumen Impaction This is much better since Dr. Bubba Camp removed impaction; will continue to follow Continue prn Tylenol for pain; pt states this works well for her.  Westley Foots, Student-NP

## 2014-03-13 NOTE — Addendum Note (Signed)
Addended by: Gerlene Fee on: 03/13/2014 02:04 PM   Modules accepted: Level of Service

## 2014-03-26 NOTE — Progress Notes (Signed)
This encounter was created in error - please disregard.

## 2014-03-26 NOTE — Progress Notes (Signed)
Patient ID: [name], female   DOB: 1935/06/03, 79 y.o.   MRN: [id number]      Code Status: Full    Allergies   Allergen  Reactions   .  Contrast Media [Iodinated Diagnostic Agents]         Unknown   .  Penicillins         Unknown     Chief Complaint: Medical management of chronic issues  HPI:   [name] is a pleasant 79yr old female who is being seen today for medical management of chronic issues and medications. She has a extensive history of HTN, HLD, GERD, MS, Quadriplegic, and DM.  No new issues reported from staff. Pt is awake in bed. She reports no new concerns, however is still having chronic issues with a wet cough. This is primarily due to her quadriplegia. She states it is better since her last visit in July, but she is still having issues some days. She denies choking, difficulty swallowing, shortness of breath, or related to eating or drinking. She describes her secretions as clear/white sometimes thickened. She is able cough the secretions up, but sometimes has difficulty spitting them out due to being to thick. She denies chest pain, shortness of breath, difficulty breathing or pain. She does endorse her ears feel better from previous visit and her facial itching has improved.     Review of Systems:   ROS   DATA OBTAINED: from patient, nurse, medical record   GENERAL: Feels well No recent fever, fatigue, change in activity status, appetite, or weight   EYES: NO change in vision, eye drainage or pain.   NOSE: Denies congestion or runny nose   RESPIRATORY: Congested cough with clear/white sputum production.  No SOB or wheezing or difficulty breathing. CARDIAC: No chest pain, palpitations. No edema   GI: No abdominal pain No Nausea,vomiting,diarrhea or constipation No heartburn or reflux   MUSCULOSKELETAL: quadriplegia with contractures  NEUROLOGIC: Facial itching, improving.  No dizziness, fainting, headache, numbness No change in mental status.   PSYCHIATRIC: No  feelings of anxiety, depression Sleeps well intermittently. No behavior issue     Past Medical History   Diagnosis  Date   .  Hypertension     .  Diabetes mellitus     .  GERD (gastroesophageal reflux disease)     .  Neuromuscular disorder     .  Multiple sclerosis     .  Anxiety     .  Hyperlipemia     .  Pneumonia     .  Colitis     .  Unable to ambulate     .  GI bleed  09/07/2012      Past Surgical History   Procedure  Laterality  Date   .  Tubal ligation       .  Flexible sigmoidoscopy    10/05/2011       Procedure: FLEXIBLE SIGMOIDOSCOPY;  Surgeon: Winfield Cunas., MD;  Location: [facility];  Service: Endoscopy;  Laterality: N/A;     pt. coming from [place] daughter phone no. [phone] might come with her   sedation is needed, pt. is paraplegic   .  Esophagogastroduodenoscopy  N/A  09/09/2012       Procedure: ESOPHAGOGASTRODUODENOSCOPY (EGD);  Surgeon: Winfield Cunas., MD;  Location: [facility] ENDOSCOPY;  Service: Endoscopy;  Laterality: N/A;   .  Colonoscopy  N/A  09/09/2012       Procedure: COLONOSCOPY;  Surgeon: Winfield Cunas., MD;  Location: [facility] ENDOSCOPY;  Service: Endoscopy;  Laterality: N/A;  possible flex sig    Social History:   reports that she quit smoking about 18 years ago. She has never used smokeless tobacco. She reports that she does not drink alcohol or use illicit drugs.    Family History   Problem  Relation  Age of Onset   .  CAD  Mother     .  Diabetes  Daughter     .  Diabetes  Daughter     .  Diabetes  Son       Medications:    Medication List           This list is accurate as of: 03/13/14  9:27 AM.  Always use your most recent med list.                       ALPRAZolam 0.25 MG tablet   Commonly known as:  XANAX   Take 0.25 mg by mouth at bedtime as needed for sleep.        aspirin 81 MG tablet   Take 81 mg by mouth daily.        atorvastatin 20 MG tablet   Commonly known as:  LIPITOR   Take 20 mg  by mouth See admin instructions. Takes 1 tablet daily on mondays, Wednesdays, and fridays.        cyanocobalamin 100 MCG tablet   Take 1 tablet (100 mcg total) by mouth daily.        fexofenadine 180 MG tablet   Commonly known as:  ALLEGRA ALLERGY   Take 1 tablet (180 mg total) by mouth daily.        fluticasone 50 MCG/ACT nasal spray   Commonly known as:  FLONASE   Place 2 sprays into both nostrils daily.        gabapentin 100 MG capsule   Commonly known as:  NEURONTIN   Take 3 capsules (300 mg total) by mouth 3 (three) times daily.        glyBURIDE 2.5 MG tablet   Commonly known as:  DIABETA   Take 2.5 mg by mouth 2 (two) times daily with a meal.        ibuprofen 800 MG tablet   Commonly known as:  ADVIL,MOTRIN   Take 1 tablet (800 mg total) by mouth every 6 (six) hours as needed.        metFORMIN 500 MG 24 hr tablet   Commonly known as:  GLUCOPHAGE XR   Take 1 tablet (500 mg total) by mouth daily with breakfast.        metoprolol tartrate 25 MG tablet   Commonly known as:  LOPRESSOR   Take 25 mg by mouth 2 (two) times daily.        pantoprazole 40 MG tablet   Commonly known as:  PROTONIX   Take 40 mg by mouth daily.        polyethylene glycol packet   Commonly known as:  MIRALAX / GLYCOLAX   Take 17 g by mouth 2 (two) times daily.        polyvinyl alcohol 1.4 % ophthalmic solution   Commonly known as:  LIQUIFILM TEARS   Place 1 drop into both eyes 3 (three) times daily as needed (dry eyes).        sulfaDIAZINE 500 MG tablet   Take 1,000 mg by mouth 2 (two)  times daily.             Physical Exam:    Filed Vitals:     03/13/14 0911   BP:  136/72   Pulse:  82   Temp:  97.6 F (36.4 C)   Resp:  18   Weight:  139 lb (63.05 kg)   SpO2:  97%     General- elderly female in no acute distress Head- atraumatic, normocephalic Eyes- PERRLA, EOMI, no pallor, no icterus, no discharge Neck- no lymphadenopathy, no thyromegaly, no jugular vein distension, no  carotid bruit Ears- left ear normal tympanic membrane and normal external ear canal , right ear normal tympanic membrane and normal external ear canal Throat- moist mucus membrane, normal oropharynx Nose- normal nasal mucosa, no maxillary or frontal sinus tenderness Chest- no chest wall deformities, no chest wall tenderness Cardiovascular- normal s1,s2, no murmurs/ rubs/ gallops Respiratory- bilateral clear to auscultation, no wheeze, no rhonchi, no crackles, no use of accessory muscles Abdomen- bowel sounds present, soft, non tender, no organomegaly, no abdominal bruits, no guarding or rigidity, no CVA tenderness Musculoskeletal- quadriplegia; no movement to any extremities. Arms and legs contracted with muscle atrophy and foot drop bilaterally Neurological- clear speech; no reflexes; CN intact Skin- warm and dry Psychiatry- alert and oriented to person, place and time, normal mood and affect    SIGNIFICANT DIAGNOSTIC EXAMS  10-31-12: chest x-ray: borderline cardiomegaly with change with new minimal pulmonary vascular congestion no pleural effusion. Mild bilateral atelectasis appears worse on left and new on right. Co-existing interstitial pneumonitis cannot be excluded at lung bases. No inflammatory consolidate or nodule. Mild bronchitis at perihilar regions.   07-11-13: chest x-ray; borderline heart size and pulmonary vasculature; modeerate to prominent changes of bronchitis to both mid and lower lungs.     LABS REVIEWED:     06-14-13: u/a: neg 07-09-13:rapid flu: neg   07-12-13: urine culture: levaquin   08-05-13: wbc 7.8;hgb 13.8; hct 43.3; mcv 91.4 ;plt 263; hgb a1c 6.7   09-10-13: wbc 9.0; hgb 13.0; hct 41.7; mcv 93.1 plt 298; glucose 134; bun 23; creat 0.6; k+4.3; na++141 chol 143 ;ldl 83 trig 148; hgb a1c 7.0; vit b12: 805   10-10-13: wbc 8.7; hgb 13.3; hct 42.8; mcv 92; plt 257; hgb a1c 6.7     Assessment/Plan 1. MS (multiple sclerosis) She is without change in her status; will  continue her current plan of care and will monitor her status.    2. Anemia Her anemia is stable on B12. Continue to monitor.    3. Ulcerative colitis, unspecified She is stable will continue azulfidine 1 gm twice daily and will monitor. Will monitor CMP periodically.  4. GERD (gastroesophageal reflux disease) Is stable will continue protonix 40 mg daily   5. Unspecified constipation Is stable will continue miralax twice daily   6. Essential hypertension, benign BP at goal. will continue lopressor 25 mg twice daily; asa 81 mg daily  and will monitor   7. Dyslipidemia LDL is at goal at 83. Is stable will continue lipitor 20 mg three times weekly    8. Type II or unspecified type diabetes mellitus without mention of complication, uncontrolled  A1C is at goal. Will continue  glyburide 2.5 mg  daily and will continue metformin xr 500 mg daily Blood sugars stable in 140-150s  9.  Trigeminal neuralgia associated with MS This is much better since Neurontin 300mg  TID times changed; facial itching much improved.  10. Cough This is an on-going  concern. Will try Robitussin 10ml every 6 hours prn as needed cough   11. Cerumen Impaction This is much better since Dr. Bubba Camp removed impaction; will continue to follow Continue prn Tylenol for pain; pt states this works well for her.  Westley Foots, [place] NP [facility]  Contact [phone] Monday through Friday 8am- 5pm  After hours call [phone]

## 2014-03-26 NOTE — Addendum Note (Signed)
Addended by: Gerlene Fee on: 03/26/2014 08:32 PM   Modules accepted: Level of Service, SmartSet

## 2014-04-10 ENCOUNTER — Encounter: Payer: Self-pay | Admitting: Adult Health

## 2014-04-10 ENCOUNTER — Non-Acute Institutional Stay (SKILLED_NURSING_FACILITY): Payer: Medicare Other | Admitting: Adult Health

## 2014-04-10 DIAGNOSIS — G500 Trigeminal neuralgia: Secondary | ICD-10-CM

## 2014-04-10 DIAGNOSIS — G35 Multiple sclerosis: Secondary | ICD-10-CM

## 2014-04-10 DIAGNOSIS — E119 Type 2 diabetes mellitus without complications: Secondary | ICD-10-CM

## 2014-04-10 DIAGNOSIS — E785 Hyperlipidemia, unspecified: Secondary | ICD-10-CM

## 2014-04-10 DIAGNOSIS — I10 Essential (primary) hypertension: Secondary | ICD-10-CM

## 2014-04-10 DIAGNOSIS — K219 Gastro-esophageal reflux disease without esophagitis: Secondary | ICD-10-CM

## 2014-04-10 NOTE — Progress Notes (Signed)
[name]  Code Status: Full  Allergies  Allergen Reactions  . Contrast Media [Iodinated Diagnostic Agents]     Unknown  . Penicillins     Unknown    Chief Complaint: Medical management of Chronic Health Issues  HPI:  Ms. [name] is a 79 yr old female being seen today for a routine visit. Staff reports no new issues with patient. Patients only concern is still a wet cough with clear sputum production, this is a chronic issues. She does prn Robitussin if needed to help with secretion. Overall she states she is doing very well. She denies chest pain, shortness of breath, difficulty breathing, or pain.   Review of Systems:  Constitutional: Negative for fever, chills, weight loss, malaise/fatigue and diaphoresis. Marland Kitchen  Respiratory: Positive cough with clear sputum production. Negative for shortness of breath and wheezing.   Cardiovascular: Negative for chest pain, palpitations, orthopnea and leg swelling.  Gastrointestinal: Negative for heartburn, nausea, vomiting, abdominal pain, diarrhea and constipation.  Genitourinary: Negative for dysuria, urgency, frequency, hematuria and flank pain.  Musculoskeletal: Quadriplegia with contractures Skin: Negative for itching and rash.  Neurological: Negative for weakness,dizziness, tingling, focal weakness and headaches.  Psychiatric/Behavioral: Negative for depression and memory loss. The patient is not nervous/anxious.     Past Medical History  Diagnosis Date  . Hypertension   . Diabetes mellitus   . GERD (gastroesophageal reflux disease)   . Neuromuscular disorder   . Multiple sclerosis   . Anxiety   . Hyperlipemia   . Pneumonia   . Colitis   . Unable to ambulate   . GI bleed 09/07/2012   Past Surgical History  Procedure Laterality Date  . Tubal ligation    . Flexible sigmoidoscopy  10/05/2011    Procedure: FLEXIBLE SIGMOIDOSCOPY;  Surgeon: Winfield Cunas., MD;  Location: [facility];  Service: Endoscopy;  Laterality: N/A;     pt. coming from [place] daughter phone no. [phone] might come with her  sedation is needed, pt. is paraplegic  . Esophagogastroduodenoscopy N/A 09/09/2012    Procedure: ESOPHAGOGASTRODUODENOSCOPY (EGD);  Surgeon: Winfield Cunas., MD;  Location: [facility] ENDOSCOPY;  Service: Endoscopy;  Laterality: N/A;  . Colonoscopy N/A 09/09/2012    Procedure: COLONOSCOPY;  Surgeon: Winfield Cunas., MD;  Location: [facility] ENDOSCOPY;  Service: Endoscopy;  Laterality: N/A;  possible flex sig   Social History:   reports that she quit smoking about 18 years ago. She has never used smokeless tobacco. She reports that she does not drink alcohol or use illicit drugs.  Family History  Problem Relation Age of Onset  . CAD Mother   . Diabetes Daughter   . Diabetes Daughter   . Diabetes Son     Outpatient Encounter Prescriptions as of 04/10/2014  Medication Sig  . ALPRAZolam (XANAX) 0.25 MG tablet Take 0.25 mg by mouth at bedtime as needed for sleep.  Marland Kitchen aspirin 81 MG tablet Take 81 mg by mouth daily.  Marland Kitchen atorvastatin (LIPITOR) 20 MG tablet Take 20 mg by mouth See admin instructions. Takes 1 tablet daily on mondays, Wednesdays, and fridays.  . fexofenadine (ALLEGRA ALLERGY) 180 MG tablet Take 1 tablet (180 mg total) by mouth daily.  . fluticasone (FLONASE) 50 MCG/ACT nasal spray Heath 2 sprays into both nostrils daily.  Marland Kitchen gabapentin (NEURONTIN) 100 MG capsule Take 3 capsules (300 mg total) by mouth 3 (three) times daily.  Marland Kitchen glyBURIDE (DIABETA) 2.5 MG tablet Take 2.5 mg by mouth daily with breakfast.   .  ibuprofen (ADVIL,MOTRIN) 800 MG tablet Take 1 tablet (800 mg total) by mouth every 6 (six) hours as needed.  . metFORMIN (GLUCOPHAGE XR) 500 MG 24 hr tablet Take 1 tablet (500 mg total) by mouth daily with breakfast.  . metoprolol tartrate (LOPRESSOR) 25 MG tablet Take 25 mg by mouth 2 (two) times daily.  . pantoprazole (PROTONIX) 40 MG tablet Take 40 mg by mouth daily.  . polyethylene  glycol (MIRALAX / GLYCOLAX) packet Take 17 g by mouth 2 (two) times daily.  . polyvinyl alcohol (LIQUIFILM TEARS) 1.4 % ophthalmic solution Heath 1 drop into both eyes 3 (three) times daily as needed (dry eyes).  Marland Kitchen sulfaDIAZINE 500 MG tablet Take 1,000 mg by mouth 2 (two) times daily.  . vitamin B-12 100 MCG tablet Take 1 tablet (100 mcg total) by mouth daily.       Physical Exam:  Filed Vitals:   04/10/14 0915  BP: 112/58  Pulse: 79  Temp: 97.8 F (36.6 C)  Resp: 20  Weight: 140 lb (63.504 kg)  SpO2: 95%    General- elderly female in no acute distress Neck- no lymphadenopathy, no thyromegaly, no jugular vein distension, no carotid bruit Cardiovascular- normal s1,s2, no murmurs/ rubs/ gallops; no leg edema Respiratory- bilateral clear to auscultation, no wheeze, no rhonchi, no crackles, no use of accessory muscles Abdomen- bowel sounds present, soft, non tender, no organomegaly, no abdominal bruits, no guarding or rigidity, no CVA tenderness Musculoskeletal- quadriplegia; no movement to any extremities; arms and legs contracted with bilateral foot drop Neurological- clear speech; CN intact Skin- warm and dry; no rashes or skin breakdown present Psychiatry- alert and oriented to person, Heath and time, normal mood and affect     SIGNIFICANT DIAGNOSTIC EXAMS  10-31-12: chest x-ray: borderline cardiomegaly with change with new minimal pulmonary vascular congestion no pleural effusion. Mild bilateral atelectasis appears worse on left and new on right. Co-existing interstitial pneumonitis cannot be excluded at lung bases. No inflammatory consolidate or nodule. Mild bronchitis at perihilar regions.   07-11-13: chest x-ray; borderline heart size and pulmonary vasculature; modeerate to prominent changes of bronchitis to both mid and lower lungs.     LABS REVIEWED:     06-14-13: u/a: neg 07-09-13:rapid flu: neg   07-12-13: urine culture: levaquin   08-05-13: wbc 7.8;hgb 13.8; hct  43.3; mcv 91.4 ;plt 263; hgb a1c 6.7   09-10-13: wbc 9.0; hgb 13.0; hct 41.7; mcv 93.1 plt 298; glucose 134; bun 23; creat 0.6; k+4.3; na++141 chol 143 ;ldl 83 trig 148; hgb a1c 7.0; vit b12: 805   10-10-13: wbc 8.7; hgb 13.3; hct 42.8; mcv 92; plt 257; hgb a1c 6.7  03-13-14: wbc 7.4; hgb 12.6; hct 42.0; mcv 89.4; plt 257; glucose 132; bun 33; creat 0.7; k+4.9; na++134; hgb a1c 6.7; chol 137; ldl 72; trig 178      Assessment/Plan  1. MS (multiple sclerosis): She is without change in her status; will continue her current plan of care and will monitor her status.    2. Anemia: Her anemia is stable; hgb 12.6; will continue B12 133mcg daily. Continue to monitor.    3. Ulcerative colitis, unspecified: She is stable will continue azulfidine 1 gm twice daily and will monitor. CMP stable at blood draw.  4. GERD (gastroesophageal reflux disease): No new issues; Is stable will continue protonix 40 mg daily   5. Unspecified constipation: No issues; Is stable will continue miralax twice daily   6. Essential hypertension, benign: stable: BP at goal.  will continue lopressor 25 mg twice daily; asa 81 mg daily  and will monitor   7. Dyslipidemia: stable LDL is at goal at 72. Is stable will continue lipitor 20 mg three times weekly    8. Type II or unspecified type diabetes mellitus without mention of complication, uncontrolled:  A1C is 6.7; remains stable. Will continue  glyburide 2.5 mg  daily and will continue metformin xr 500 mg daily. Blood sugars stable in 140-150s  9.  Trigeminal neuralgia associated with MS: This is much better since Neurontin 300mg  TID times changed; will continue to monitor  10. Cough: chronic; no changes; Has Robitussin 45ml every 6 hours prn as needed cough   Westley Foots, Noel NP [facility]  Contact [phone] Monday through Friday 8am- 5pm  After hours call [phone]

## 2014-04-13 ENCOUNTER — Encounter: Payer: Self-pay | Admitting: Adult Health

## 2014-04-13 DIAGNOSIS — E119 Type 2 diabetes mellitus without complications: Secondary | ICD-10-CM | POA: Insufficient documentation

## 2014-04-17 ENCOUNTER — Non-Acute Institutional Stay (SKILLED_NURSING_FACILITY): Payer: Medicare Other | Admitting: Internal Medicine

## 2014-04-17 DIAGNOSIS — H9203 Otalgia, bilateral: Secondary | ICD-10-CM

## 2014-04-17 DIAGNOSIS — J0100 Acute maxillary sinusitis, unspecified: Secondary | ICD-10-CM

## 2014-04-17 NOTE — Progress Notes (Signed)
Patient ID: THU BAGGETT, female   DOB: Oct 26, 1934, 79 y.o.   MRN: [id number]    Facility: [facility]   Chief Complaint  Patient presents with  . Acute Visit    earaches, congestion   Allergies  Allergen Reactions  . Contrast Media [Iodinated Diagnostic Agents]     Unknown  . Penicillins     Unknown   HPI 79 y/o female pt is seen today for acute concerns. She complaints of pain in both her ears x 2 weeks. She feels stuffed up in her nose, has headache and feels her ears to be full. Denies ear discharge or tinnitus. She is on flonase and allegra.   ROS Negative for fever or chills Has cough with yellow phlegm Denies rhinorrhea Denies dyspnea or chest pain Denies vomiting but has occassional nausea  Medication reviewed. See [facility]  Physical exam BP 146/64  Pulse 89  Temp(Src) 97.6 F (36.4 C)  Resp 18  SpO2 95%  Constitutional: She is oriented to person, place, and time. thin built Head: no temporal tenderness, normocephalic Ear: some cerumen in both ear limiting the view but normal ear canal and tympanic membrane Nose: dark pink nasal mucosa on right side, swollen turbinates, left side normal Sinuses: maxillary sinus tenderness on exam Oral: normal oropharynx, no exudates Neck: No tracheal deviation present. No thyromegaly present.   Cardiovascular: Normal rate, regular rhythm and intact distal pulses.    Respiratory: Effort normal and breath sounds normal. No respiratory distress.   Lymphadenopathy: She has no cervical adenopathy.  Neurological: She is alert and oriented to person, place, and time.   Skin: Skin is warm and dry.   Assessment/plan  Acute maxillary sinusitis Will have her on augmentin 875 mg bid for a week with florastor bid x 2 weeks. Reassess  Otalgia Likely from eustachian tube dysfunction in setting of her sinusitis. Continue allagra and prn motrin for pain

## 2014-04-22 IMAGING — CR DG CHEST 1V PORT
1 series · 1 of 1 positions shown · non-contrast
Comparison: 11/15/2012

CLINICAL DATA: Possible pneumonia.  Prior smoker.  Bronchitis.

PORTABLE CHEST - 1 VIEW

[AP]
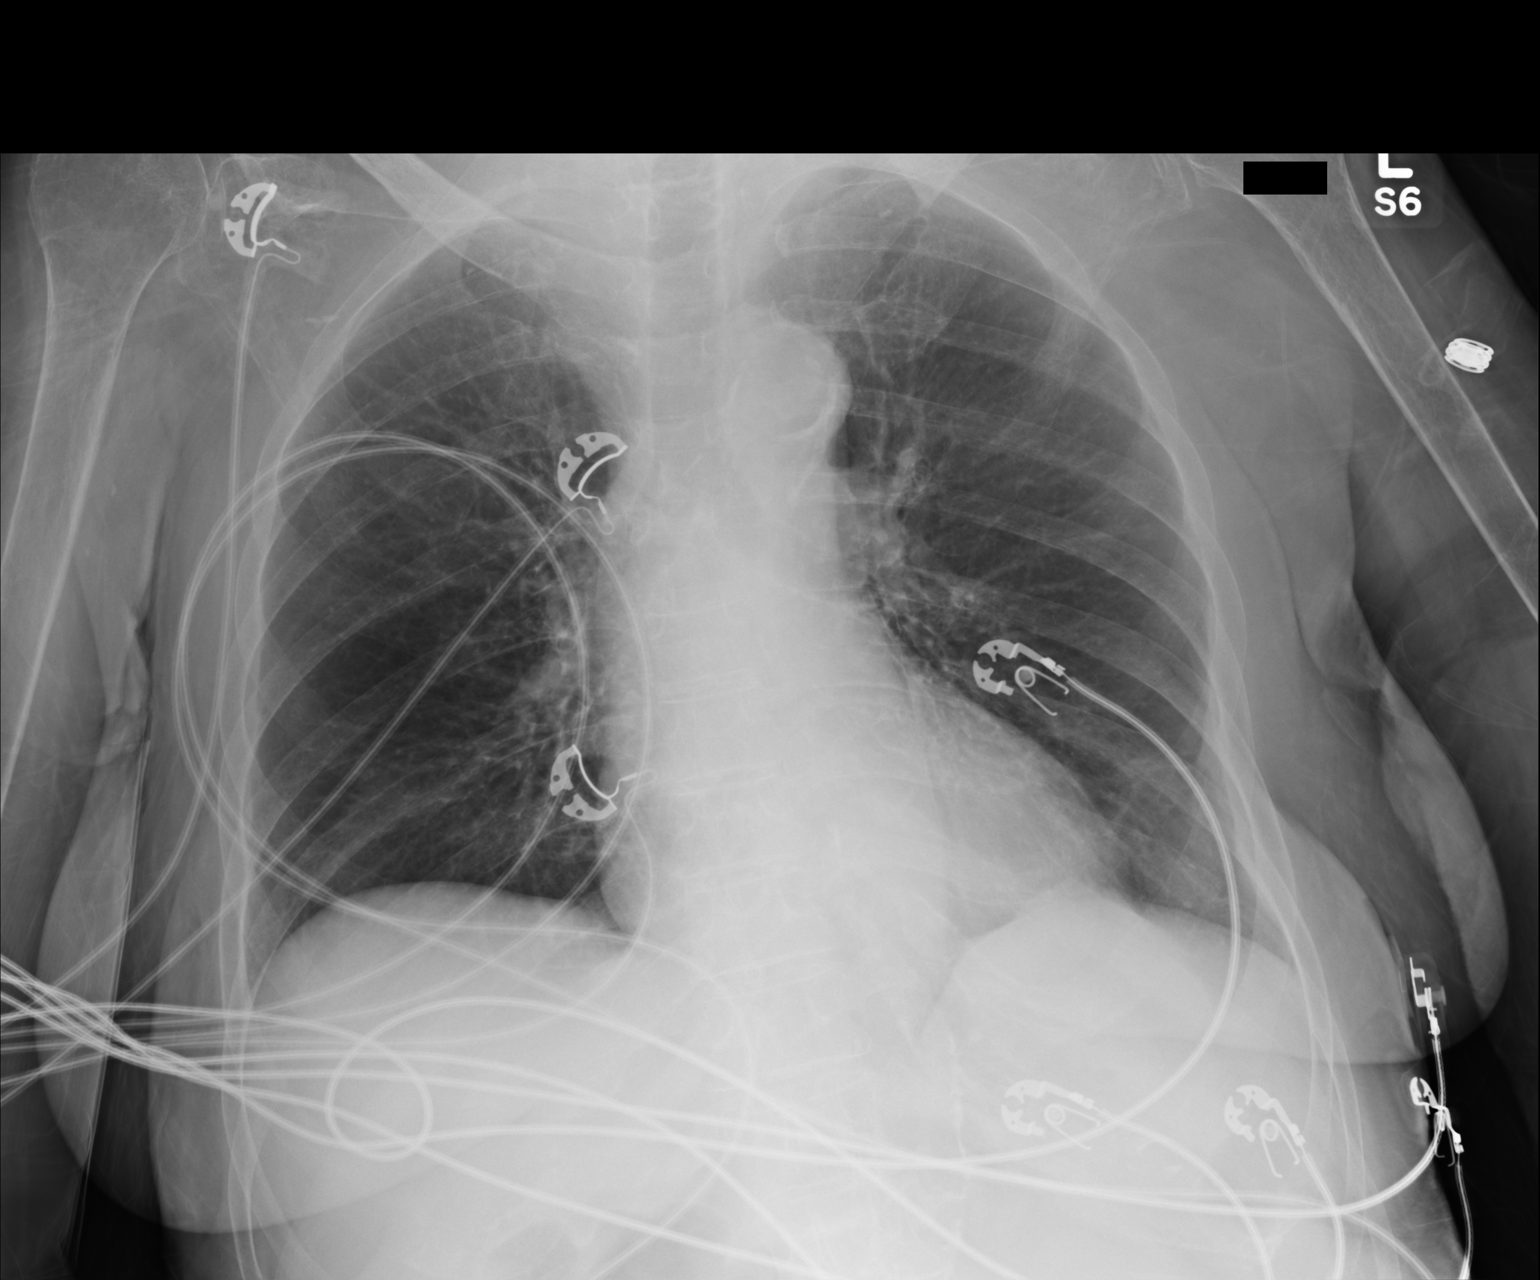

[1 of 1 positions shown; findings below may reference images not displayed]

FINDINGS: Patchy opacity in the left lung base.  Right lung is
clear.  Heart is normal size.  No effusions.  No acute bony
abnormality.
IMPRESSION: Patchy vague left basilar airspace opacity.  This could represent
developing infiltrate/pneumonia.

## 2014-05-12 ENCOUNTER — Non-Acute Institutional Stay (SKILLED_NURSING_FACILITY): Payer: Medicare Other | Admitting: Adult Health

## 2014-05-12 ENCOUNTER — Encounter: Payer: Self-pay | Admitting: Adult Health

## 2014-05-12 DIAGNOSIS — K219 Gastro-esophageal reflux disease without esophagitis: Secondary | ICD-10-CM

## 2014-05-12 DIAGNOSIS — I10 Essential (primary) hypertension: Secondary | ICD-10-CM

## 2014-05-12 DIAGNOSIS — G35 Multiple sclerosis: Secondary | ICD-10-CM

## 2014-05-12 DIAGNOSIS — E785 Hyperlipidemia, unspecified: Secondary | ICD-10-CM

## 2014-05-12 DIAGNOSIS — R42 Dizziness and giddiness: Secondary | ICD-10-CM

## 2014-05-12 DIAGNOSIS — G8250 Quadriplegia, unspecified: Secondary | ICD-10-CM

## 2014-05-12 DIAGNOSIS — K51919 Ulcerative colitis, unspecified with unspecified complications: Secondary | ICD-10-CM

## 2014-05-12 DIAGNOSIS — G500 Trigeminal neuralgia: Secondary | ICD-10-CM

## 2014-05-12 DIAGNOSIS — K5900 Constipation, unspecified: Secondary | ICD-10-CM

## 2014-05-12 DIAGNOSIS — G35D Multiple sclerosis, unspecified: Secondary | ICD-10-CM

## 2014-05-12 MED ORDER — MECLIZINE HCL 25 MG PO TABS: 25.0000 mg | ORAL_TABLET | Freq: Two times a day (BID) | ORAL | Status: DC

## 2014-05-12 NOTE — Progress Notes (Signed)
Patient ID: [name], female   DOB: 1935-02-03, 79 y.o.   MRN: [id number]     [facility]  Allergies  Allergen Reactions  . Contrast Media [Iodinated Diagnostic Agents]     Unknown  . Penicillins     Unknown    Chief Complaint  Patient presents with  . Medical Management of Chronic Issues    HPI:  She is a long term resident of this facility begin seen for the management of her chronic illnesses. She has been treated for acute sinusitis this past month without complications. She is not having ear pain at this time. She has been having nausea and vomiting worse at night she and her family states for an extended period of time. She states that when she turns from side to side she gets dizzy which contributes to the nausea. She says that she has had this in the past and was treated with meclizine which was effective for her. I have spoken with her daughter regarding her nausea.  Past Medical History  Diagnosis Date  . Hypertension   . Diabetes mellitus   . GERD (gastroesophageal reflux disease)   . Neuromuscular disorder   . Multiple sclerosis   . Anxiety   . Hyperlipemia   . Pneumonia   . Colitis   . Unable to ambulate   . GI bleed 09/07/2012  . Type II or unspecified type diabetes mellitus without mention of complication, uncontrolled 02/03/2013  . Ulcerative colitis, unspecified 02/03/2013    Past Surgical History  Procedure Laterality Date  . Tubal ligation    . Flexible sigmoidoscopy  10/05/2011    Procedure: FLEXIBLE SIGMOIDOSCOPY;  Surgeon: Winfield Cunas., MD;  Location: [facility] ENDOS[facility];  Service: Endoscopy;  Laterality: N/A;   pt. coming from [place] daughter phone no. [phone] might come with her  sedation is needed, pt. is paraplegic  . Esophagogastroduodenoscopy N/A 09/09/2012    Procedure: ESOPHAGOGASTRODUODENOSCOPY (EGD);  Surgeon: Winfield Cunas., MD;  Location: [facility] ENDOSCOPY;  Service: Endoscopy;  Laterality: N/A;  .  Colonoscopy N/A 09/09/2012    Procedure: COLONOSCOPY;  Surgeon: Winfield Cunas., MD;  Location: [facility] ENDOSCOPY;  Service: Endoscopy;  Laterality: N/A;  possible flex sig    VITAL SIGNS BP 118/73 mmHg  Pulse 70  Ht 5\' 4"  (1.626 m)  Wt 139 lb (63.05 kg)  BMI 23.85 kg/m2   Patient's Medications  New Prescriptions   No medications on file  Previous Medications   ALPRAZOLAM (XANAX) 0.25 MG TABLET    Take 0.25 mg by mouth at bedtime as needed for sleep.   ASPIRIN 81 MG TABLET    Take 81 mg by mouth daily.   ATORVASTATIN (LIPITOR) 20 MG TABLET    Take 20 mg by mouth See admin instructions. Takes 1 tablet daily on mondays, Wednesdays, and fridays.   FEXOFENADINE (ALLEGRA ALLERGY) 180 MG TABLET    Take 1 tablet (180 mg total) by mouth daily.   FLUTICASONE (FLONASE) 50 MCG/ACT NASAL SPRAY    Place 2 sprays into both nostrils daily as needed.    GABAPENTIN (NEURONTIN) 100 MG CAPSULE    Take 3 capsules (300 mg total) by mouth 3 (three) times daily.   GLYBURIDE (DIABETA) 2.5 MG TABLET    Take 2.5 mg by mouth daily with breakfast.    IBUPROFEN (ADVIL,MOTRIN) 800 MG TABLET    Take 1 tablet (800 mg total) by mouth every 6 (six) hours as needed.   METFORMIN (GLUCOPHAGE XR)  500 MG 24 HR TABLET    Take 1 tablet (500 mg total) by mouth daily with breakfast.   METOPROLOL TARTRATE (LOPRESSOR) 25 MG TABLET    Take 25 mg by mouth 2 (two) times daily.   PANTOPRAZOLE (PROTONIX) 40 MG TABLET    Take 40 mg by mouth daily.   POLYETHYLENE GLYCOL (MIRALAX / GLYCOLAX) PACKET    Take 17 g by mouth 2 (two) times daily.   SULFADIAZINE 500 MG TABLET    Take 1,000 mg by mouth 2 (two) times daily.   VITAMIN B-12 100 MCG TABLET    Take 1 tablet (100 mcg total) by mouth daily.  Modified Medications   No medications on file  Discontinued Medications   POLYVINYL ALCOHOL (LIQUIFILM TEARS) 1.4 % OPHTHALMIC SOLUTION    Place 1 drop into both eyes 3 (three) times daily as needed (dry eyes).    SIGNIFICANT DIAGNOSTIC  EXAMS  10-31-12: chest x-ray: borderline cardiomegaly with change with new minimal pulmonary vascular congestion no pleural effusion. Mild bilateral atelectasis appears worse on left and new on right. Co-existing interstitial pneumonitis cannot be excluded at lung bases. No inflammatory consolidate or nodule. Mild bronchitis at perihilar regions.   07-11-13: chest x-ray; borderline heart size and pulmonary vasculature; modeerate to prominent changes of bronchitis to both mid and lower lungs.     LABS REVIEWED:     06-14-13: u/a: neg 07-09-13:rapid flu: neg   07-12-13: urine culture: levaquin   08-05-13: wbc 7.8;hgb 13.8; hct 43.3; mcv 91.4 ;plt 263; hgb a1c 6.7   09-10-13: wbc 9.0; hgb 13.0; hct 41.7; mcv 93.1 plt 298; glucose 134; bun 23; creat 0.6; k+4.3; na++141 chol 143 ;ldl 83 trig 148; hgb a1c 7.0; vit b12: 805   10-10-13: wbc 8.7; hgb 13.3; hct 42.8; mcv 92; plt 257; hgb a1c 6.7  03-13-14: wbc 7.4; hgb 12.6; hct 42.0; mcv 89.4; plt 257; glucose 132; bun 33; creat 0.7; k+4.9; na++134; hgb a1c 6.7; chol 137; ldl 72; trig 178       Review of Systems  Constitutional: Negative for malaise/fatigue.  Respiratory: Negative for cough and shortness of breath.   Cardiovascular: Negative for chest pain and palpitations.  Gastrointestinal: Positive for nausea and vomiting. Negative for abdominal pain, diarrhea and constipation.  Musculoskeletal: Negative for myalgias and joint pain.  Skin: Negative.   Neurological: Positive for dizziness.  Psychiatric/Behavioral: Negative for depression. The patient is not nervous/anxious.      Physical Exam  Constitutional: She is oriented to person, place, and time. She appears well-developed and well-nourished. No distress.  Neck: Neck supple. No JVD present. No thyromegaly present.  Cardiovascular: Normal rate, regular rhythm, normal heart sounds and intact distal pulses.   Respiratory: Effort normal and breath sounds normal. No respiratory distress. She has  no wheezes.  GI: Soft. Bowel sounds are normal. She exhibits no distension. There is no tenderness.  Musculoskeletal: She exhibits no edema.  Has quadriplegia; is unable to move extremities.  Has bilateral hand splints in place   Neurological: She is alert and oriented to person, place, and time.  Skin: Skin is warm and dry. She is not diaphoretic.       Assessment/Plan  1. MS (multiple sclerosis): She is without change in her status; will continue her current plan of care and will monitor her status.    2. Anemia: Her anemia is stable; hgb 12.6; will continue B12 169mcg daily.she is not on iron supplement.  Continue to monitor.    3.  Ulcerative colitis, unspecified: She is stable will continue azulfidine 1 gm twice daily and will monitor.   4. GERD (gastroesophageal reflux disease): No new issues; Is stable will continue protonix 40 mg daily   5. Unspecified constipation: No issues; Is stable will continue miralax twice daily   6. Essential hypertension, benign: stable: BP at goal. will continue lopressor 25 mg twice daily; asa 81 mg daily  and will monitor   7. Dyslipidemia: stable LDL is at goal at 72. Is stable will continue lipitor 20 mg three times weekly    8. Type II or unspecified type diabetes mellitus without mention of complication, uncontrolled:  A1C is 6.7; remains stable. Will continue  glyburide 2.5 mg  daily and will continue metformin xr 500 mg daily. Blood sugars stable less than 170.   9.  Trigeminal neuralgia associated with MS: she is doing well with  Neurontin 300mg  three times daily; she is not having ear pain. Will monitor her status. Will continue her motrin 800 mg every 6 hours as needed.   10. Vertigo with nausea: at this time will start her on meclizine 25 mg twice daily with meals; and daily as needed. I have spoken with her daughter regarding her nausea. Will monitor her status and will follow up as indicated.     Ok Edwards NP [facility]  Contact [phone] Monday through Friday 8am- 5pm  After hours call [phone]

## 2014-05-15 ENCOUNTER — Non-Acute Institutional Stay (SKILLED_NURSING_FACILITY): Payer: Medicare Other | Admitting: Adult Health

## 2014-05-15 DIAGNOSIS — R42 Dizziness and giddiness: Secondary | ICD-10-CM

## 2014-05-26 ENCOUNTER — Encounter: Payer: Self-pay | Admitting: Adult Health

## 2014-05-26 MED ORDER — MECLIZINE HCL 25 MG PO TABS: 25.0000 mg | ORAL_TABLET | Freq: Three times a day (TID) | ORAL | Status: DC

## 2014-05-26 NOTE — Progress Notes (Signed)
Patient ID: [name], female   DOB: 04-14-1935, 79 y.o.   MRN: [id number]    [facility]   Allergies  Allergen Reactions  . Contrast Media [Iodinated Diagnostic Agents]     Unknown  . Penicillins     Unknown       Chief Complaint  Patient presents with  . Acute Visit    follow up vertigo     HPI:  She has been taking meclizine 25 mg twice daily for her vertigo. Her status is improving. She states that she is not having any n/v at this time. She continues to have some vertigo especially when she moves her head to the left side. She does have a headache at times. She states she feels as though she is feeling better. At this time she is not wanting any further testing such as a ct or mri. She is not wanting to go to a specialist at this time.    Past Medical History  Diagnosis Date  . Hypertension   . Diabetes mellitus   . GERD (gastroesophageal reflux disease)   . Neuromuscular disorder   . Multiple sclerosis   . Anxiety   . Hyperlipemia   . Pneumonia   . Colitis   . Unable to ambulate   . GI bleed 09/07/2012  . Type II or unspecified type diabetes mellitus without mention of complication, uncontrolled 02/03/2013  . Ulcerative colitis, unspecified 02/03/2013    Past Surgical History  Procedure Laterality Date  . Tubal ligation    . Flexible sigmoidoscopy  10/05/2011    Procedure: FLEXIBLE SIGMOIDOSCOPY;  Surgeon: Winfield Cunas., MD;  Location: [facility];  Service: Endoscopy;  Laterality: N/A;   pt. coming from [place] daughter phone no. [phone] might come with her  sedation is needed, pt. is paraplegic  . Esophagogastroduodenoscopy N/A 09/09/2012    Procedure: ESOPHAGOGASTRODUODENOSCOPY (EGD);  Surgeon: Winfield Cunas., MD;  Location: [facility] ENDOSCOPY;  Service: Endoscopy;  Laterality: N/A;  . Colonoscopy N/A 09/09/2012    Procedure: COLONOSCOPY;  Surgeon: Winfield Cunas., MD;  Location: [facility] ENDOSCOPY;  Service: Endoscopy;   Laterality: N/A;  possible flex sig    VITAL SIGNS BP 120/74 mmHg  Pulse 72  Ht 5\' 4"  (1.626 m)  Wt 139 lb (63.05 kg)  BMI 23.85 kg/m2   Outpatient Encounter Prescriptions as of 05/15/2014  Medication Sig  . ALPRAZolam (XANAX) 0.25 MG tablet Take 0.25 mg by mouth at bedtime as needed for sleep.  Marland Kitchen aspirin 81 MG tablet Take 81 mg by mouth daily.  Marland Kitchen atorvastatin (LIPITOR) 20 MG tablet Take 20 mg by mouth See admin instructions. Takes 1 tablet daily on mondays, Wednesdays, and fridays.  . fexofenadine (ALLEGRA ALLERGY) 180 MG tablet Take 1 tablet (180 mg total) by mouth daily.  . fluticasone (FLONASE) 50 MCG/ACT nasal spray Place 2 sprays into both nostrils daily as needed.   . gabapentin (NEURONTIN) 100 MG capsule Take 3 capsules (300 mg total) by mouth 3 (three) times daily.  Marland Kitchen glyBURIDE (DIABETA) 2.5 MG tablet Take 2.5 mg by mouth daily with breakfast.   . ibuprofen (ADVIL,MOTRIN) 800 MG tablet Take 1 tablet (800 mg total) by mouth every 6 (six) hours as needed.  . meclizine (ANTIVERT) 25 MG tablet Take 1 tablet (25 mg total) by mouth 2 (two) times daily. And daily as needed  . metFORMIN (GLUCOPHAGE XR) 500 MG 24 hr tablet Take 1 tablet (500 mg total) by mouth daily with  breakfast.  . metoprolol tartrate (LOPRESSOR) 25 MG tablet Take 25 mg by mouth 2 (two) times daily.  . pantoprazole (PROTONIX) 40 MG tablet Take 40 mg by mouth daily.  . polyethylene glycol (MIRALAX / GLYCOLAX) packet Take 17 g by mouth 2 (two) times daily.  Marland Kitchen sulfaDIAZINE 500 MG tablet Take 1,000 mg by mouth 2 (two) times daily.  . vitamin B-12 100 MCG tablet Take 1 tablet (100 mcg total) by mouth daily.     SIGNIFICANT DIAGNOSTIC EXAMS  10-31-12: chest x-ray: borderline cardiomegaly with change with new minimal pulmonary vascular congestion no pleural effusion. Mild bilateral atelectasis appears worse on left and new on right. Co-existing interstitial pneumonitis cannot be excluded at lung bases. No inflammatory  consolidate or nodule. Mild bronchitis at perihilar regions.   07-11-13: chest x-ray; borderline heart size and pulmonary vasculature; modeerate to prominent changes of bronchitis to both mid and lower lungs.     LABS REVIEWED:     06-14-13: u/a: neg 07-09-13:rapid flu: neg   07-12-13: urine culture: levaquin   08-05-13: wbc 7.8;hgb 13.8; hct 43.3; mcv 91.4 ;plt 263; hgb a1c 6.7   09-10-13: wbc 9.0; hgb 13.0; hct 41.7; mcv 93.1 plt 298; glucose 134; bun 23; creat 0.6; k+4.3; na++141 chol 143 ;ldl 83 trig 148; hgb a1c 7.0; vit b12: 805   10-10-13: wbc 8.7; hgb 13.3; hct 42.8; mcv 92; plt 257; hgb a1c 6.7  03-13-14: wbc 7.4; hgb 12.6; hct 42.0; mcv 89.4; plt 257; glucose 132; bun 33; creat 0.7; k+4.9; na++134; hgb a1c 6.7; chol 137; ldl 72; trig 178       ROS  Constitutional: Negative for malaise/fatigue.  Respiratory: Negative for cough and shortness of breath.   Cardiovascular: Negative for chest pain and palpitations.  Gastrointestinal: negative for n/v.  Negative for abdominal pain, diarrhea and constipation.  Musculoskeletal: Negative for myalgias and joint pain.  Skin: Negative.   Neurological: Positive for dizziness. is improving  Psychiatric/Behavioral: Negative for depression. The patient is not nervous/anxious.     Physical Exam   Constitutional: She is oriented to person, place, and time. She appears well-developed and well-nourished. No distress.  Neck: Neck supple. No JVD present. No thyromegaly present.  Cardiovascular: Normal rate, regular rhythm, normal heart sounds and intact distal pulses.   Respiratory: Effort normal and breath sounds normal. No respiratory distress. She has no wheezes.  GI: Soft. Bowel sounds are normal. She exhibits no distension. There is no tenderness.  Musculoskeletal: She exhibits no edema.  Has quadriplegia; is unable to move extremities.  Has bilateral hand splints in place   Neurological: She is alert and oriented to person, place, and time.    Skin: Skin is warm and dry. She is not diaphoretic.    ASSESSMENT/ PLAN:  1. Vertigo: will increase her meclizine to 25 mg three times daily and will monitor her status. I have left a message for her family member Maudie Mercury .     Ok Edwards NP [facility]  Contact [phone] Monday through Friday 8am- 5pm  After hours call [phone]

## 2014-06-18 ENCOUNTER — Non-Acute Institutional Stay (SKILLED_NURSING_FACILITY): Payer: Medicare Other | Admitting: Registered Nurse

## 2014-06-18 DIAGNOSIS — E785 Hyperlipidemia, unspecified: Secondary | ICD-10-CM

## 2014-06-18 DIAGNOSIS — G500 Trigeminal neuralgia: Secondary | ICD-10-CM

## 2014-06-18 DIAGNOSIS — K219 Gastro-esophageal reflux disease without esophagitis: Secondary | ICD-10-CM

## 2014-06-18 DIAGNOSIS — R42 Dizziness and giddiness: Secondary | ICD-10-CM

## 2014-06-18 DIAGNOSIS — I10 Essential (primary) hypertension: Secondary | ICD-10-CM

## 2014-06-18 DIAGNOSIS — H6122 Impacted cerumen, left ear: Secondary | ICD-10-CM

## 2014-06-18 DIAGNOSIS — E119 Type 2 diabetes mellitus without complications: Secondary | ICD-10-CM

## 2014-06-18 DIAGNOSIS — K5900 Constipation, unspecified: Secondary | ICD-10-CM

## 2014-06-18 DIAGNOSIS — J309 Allergic rhinitis, unspecified: Secondary | ICD-10-CM

## 2014-06-18 DIAGNOSIS — G35 Multiple sclerosis: Secondary | ICD-10-CM

## 2014-06-18 DIAGNOSIS — K51919 Ulcerative colitis, unspecified with unspecified complications: Secondary | ICD-10-CM

## 2014-06-19 ENCOUNTER — Encounter: Payer: Self-pay | Admitting: Registered Nurse

## 2014-06-19 NOTE — Progress Notes (Signed)
Patient ID: [name], female   DOB: [birth year], 79 y.o.   MRN: [id number]   Place of Service: [facility]  Allergies  Allergen Reactions  . Contrast Media [Iodinated Diagnostic Agents]     Unknown  . Penicillins     Unknown    Code Status: Full Code  Goals of Care: Longevity/LTC   Chief Complaint  Patient presents with  . Medical Management of Chronic Issues    MS, anemia, colitis,, gerd, consitpation, vertigo, HTN, HLD, DM2, trigeminal neuralgia    HPI 79 y.o. female with PMH of MS s/p quadriplegia, ulcerative colitis, constipation, HTN, DM2, trigeminal neuralgia, vertigo among others is being seen for a routine visit for management of her chronic issues. Has lost about 5 pounds over the past month. No recent fall or skin concerns reported. No change in behaviors or functional status reported. No concerns from staff. She still having trouble with her vertigo when turning her head despite recent increased frequency of meclizine. Otherwise she denies any complaints. DM2 is controlled on metformin and glyburide: CBG range 120-128. BP is well controlled on lopressor with BP range 90s-120s/60-80s. LDL at goal on statin.   Review of Systems Constitutional: Negative for fever, chills, and fatigue. HENT: Negative for ear pain, congestion, and sore throat Eyes: Negative for eye pain, eye discharge, and visual disturbance  Cardiovascular: Negative for chest pain, palpitations, and leg swelling Respiratory: Negative cough, shortness of breath, and wheezing.  Gastrointestinal: Negative for nausea and vomiting. Negative for abdominal pain. Positive for both diarrhea and constipation Genitourinary: Negative for  Dysuria and hematuria Endocrine: Negative for polydipsia, polyphagia, and polyuria Musculoskeletal: Negative for back pain, joint pain, and joint swelling  Neurological: Negative for headache. Positive for vertigo (everything spinning when turning head) Skin: Negative for  rash and wound.   Psychiatric: Negative for depression  Past Medical History  Diagnosis Date  . Hypertension   . Diabetes mellitus   . GERD (gastroesophageal reflux disease)   . Neuromuscular disorder   . Multiple sclerosis   . Anxiety   . Hyperlipemia   . Pneumonia   . Colitis   . Unable to ambulate   . GI bleed 09/07/2012  . Type II or unspecified type diabetes mellitus without mention of complication, uncontrolled 02/03/2013  . Ulcerative colitis, unspecified 02/03/2013    Past Surgical History  Procedure Laterality Date  . Tubal ligation    . Flexible sigmoidoscopy  10/05/2011    Procedure: FLEXIBLE SIGMOIDOSCOPY;  Surgeon: Winfield Cunas., MD;  Location: [facility];  Service: Endoscopy;  Laterality: N/A;   pt. coming from [place] daughter phone no. [phone] might come with her  sedation is needed, pt. is paraplegic  . Esophagogastroduodenoscopy N/A 09/09/2012    Procedure: ESOPHAGOGASTRODUODENOSCOPY (EGD);  Surgeon: Winfield Cunas., MD;  Location: [facility] ENDOSCOPY;  Service: Endoscopy;  Laterality: N/A;  . Colonoscopy N/A 09/09/2012    Procedure: COLONOSCOPY;  Surgeon: Winfield Cunas., MD;  Location: [facility] ENDOSCOPY;  Service: Endoscopy;  Laterality: N/A;  possible flex sig    History   Social History  . Marital Status: Married    Spouse Name: N/A    Number of Children: N/A  . Years of Education: N/A   Occupational History  . Not on file.   Social History Main Topics  . Smoking status: Former Smoker -- 1.50 packs/day for 30 years    Quit date: 11/16/1995  . Smokeless tobacco: Never Used  . Alcohol Use:  No  . Drug Use: No  . Sexual Activity: Not on file   Other Topics Concern  . Not on file   Social History Narrative    Family History  Problem Relation Age of Onset  . CAD Mother   . Diabetes Daughter   . Diabetes Daughter   . Diabetes Son       Medication List       This list is accurate as of: 06/18/14 11:59 PM.   Always use your most recent med list.               ALPRAZolam 0.25 MG tablet  Commonly known as:  XANAX  Take 0.25 mg by mouth at bedtime as needed for sleep.     aspirin 81 MG tablet  Take 81 mg by mouth daily.     atorvastatin 20 MG tablet  Commonly known as:  LIPITOR  Take 20 mg by mouth See admin instructions. Takes 1 tablet daily on mondays, Wednesdays, and fridays.     cetirizine 10 MG tablet  Commonly known as:  ZYRTEC  Take 10 mg by mouth at bedtime.     cyanocobalamin 100 MCG tablet  Take 1 tablet (100 mcg total) by mouth daily.     fluticasone 50 MCG/ACT nasal spray  Commonly known as:  FLONASE  Place 2 sprays into both nostrils daily as needed.     gabapentin 100 MG capsule  Commonly known as:  NEURONTIN  Take 3 capsules (300 mg total) by mouth 3 (three) times daily.     glyBURIDE 2.5 MG tablet  Commonly known as:  DIABETA  Take 2.5 mg by mouth daily with breakfast.     ibuprofen 800 MG tablet  Commonly known as:  ADVIL,MOTRIN  Take 1 tablet (800 mg total) by mouth every 6 (six) hours as needed.     meclizine 25 MG tablet  Commonly known as:  ANTIVERT  Take 1 tablet (25 mg total) by mouth 3 (three) times daily. And daily as needed     metFORMIN 500 MG 24 hr tablet  Commonly known as:  GLUCOPHAGE XR  Take 1 tablet (500 mg total) by mouth daily with breakfast.     metoprolol tartrate 25 MG tablet  Commonly known as:  LOPRESSOR  Take 25 mg by mouth 2 (two) times daily.     pantoprazole 40 MG tablet  Commonly known as:  PROTONIX  Take 40 mg by mouth daily.     polyethylene glycol packet  Commonly known as:  MIRALAX / GLYCOLAX  Take 17 g by mouth 2 (two) times daily.     sulfaDIAZINE 500 MG tablet  Take 1,000 mg by mouth 2 (two) times daily.        Physical Exam  BP 93/61 mmHg  Pulse 65  Temp(Src) 98.8 F (37.1 C)  Resp 17  Ht 5\' 4"  (1.626 m)  Wt 134 lb 9.6 oz (61.054 kg)  BMI 23.09 kg/m2  SpO2 95%  Constitutional: WDWN  adult/elderly female in no acute distress. Conversant and pleasant.  HEENT: Normocephalic and atraumatic. PERRL. EOM intact. No icterus. Right external auditory canals patent, auricles without lesions. Left ear occluded with soft sticky brown cerumen. No nasal discharge. Maxillary sinus slightly tender to palpation. Oral mucosa moist. Posterior pharynx clear of any exudate or lesions. Denture in place Neck: Supple and nontender. No lymphadenopathy, masses, or thyromegaly. No JVD or carotid bruits. Cardiac: Normal S1, S2. RRR without appreciable murmurs, rubs, or gallops. Distal  pulses intact. No dependent edema.  Lungs: No respiratory distress. Breath sounds clear bilaterally without rales, rhonchi, or wheezes. Abdomen: Audible bowel sounds in all quadrants. Soft, nontender, nondistended.   Musculoskeletal: Quadriplegia. Able to turn head but with limited neck ROM. No joint erythema or tenderness. Bilateral foot drop and contracture noted on exam Skin: Warm and dry. No rash noted. No erythema.  Neurological: Alert and oriented to person, place, and time.  Psychiatric: Judgment and insight adequate. Appropriate mood and affect.   Labs Reviewed  CBC Latest Ref Rng 03/13/2014 09/10/2013 09/11/2012  WBC - 7.4 9.0 8.4  Hemoglobin 12.0 - 16.0 g/dL 12.6 13.0 9.5(L)  Hematocrit 36 - 46 % 42 42 31.2(L)  Platelets 150 - 399 K/L 257 298 296    CMP Latest Ref Rng 03/13/2014 09/10/2013 09/11/2012  Glucose 70 - 99 mg/dL - - 146(H)  BUN 4 - 21 mg/dL 33(A) 23(A) 8  Creatinine 0.5 - 1.1 mg/dL 0.7 0.6 0.40(L)  Sodium 137 - 147 mmol/L 134(A) 141 144  Potassium 3.4 - 5.3 mmol/L 4.9 4.3 3.4(L)  Chloride 96 - 112 mEq/L - - 115(H)  CO2 19 - 32 mEq/L - - 21  Calcium 8.4 - 10.5 mg/dL - - 8.7  Total Protein 6.0 - 8.3 g/dL - - 5.5(L)  Total Bilirubin 0.3 - 1.2 mg/dL - - 0.1(L)  Alkaline Phos 39 - 117 U/L - - 41  AST 0 - 37 U/L - - 13  ALT 0 - 35 U/L - - 6    Lab Results  Component Value Date   HGBA1C 6.7* 03/13/2014     Lab Results  Component Value Date   TSH 0.029* 11/17/2011    Lipid Panel     Component Value Date/Time   CHOL 137 03/13/2014   TRIG 178* 03/13/2014   LDLCALC 72 03/13/2014   Assessment & Plan 1. Essential hypertension, benign Stable. BP on soft side today. If continues to be low end of normal, will decrease lopressor dosage. Continue lopressor at 25mg  twice daily and monitor.   2. Allergic rhinitis, unspecified allergic rhinitis type Discontinue allegra. Start zyrtec 10mg  daily at bedtime and continue flonase 2 sprays into each nare daily. Continue to monitor  3. Ulcerative colitis, unspecified complication Stable. Continue sulfadiazine 1g twice daily and continue to monitor.  4. Gastroesophageal reflux disease without esophagitis Stable. Continue protonix 40mg  daily and monitor  5. Constipation, unspecified constipation type Stable. Continue miralax twice daily for constipation.  Continue to monitor.   6. Diabetes mellitus type II, controlled, with no complications Last Z6X 6.7. CBG range 120-128. Continue metformin xr 500mg  daily and glyburide 2.5mg  daily.  Foot (12/11/13) and eye exam (08/12/13) are up to date. Currently not on ace/arb, will check urine microalbumin/creatinine ratio. Continue statin and low dose asa. Continue to monitor.   7. MS (multiple sclerosis) Quadriplegia. Has special call bell at bedside. Continue skin care and pressure ulcer precautions. Continue to monitor  8. Trigeminal neuralgia pain Stable. Continue gabapentin 300mg  three times daily and monitor.   9. Dyslipidemia LDL at goal. Continue lipitor 20mg  three times weekly and monitor  10. Vertigo Persists. Could be secondary to left ear cerumen impaction. Continue meclizine 25mg  three times daily for now. Suggested ENT referral but patient declined at this point.   11. Cerumen impaction, left Nursing staff to irrigate left ear. Continue to monitor.    Family/Staff Communication Plan of  care discussed with resident and nursing staff. Resident and nursing staff verbalized understanding and agree  with plan of care. No additional questions or concerns reported.    Arthur Holms, MSN, AGNP-C [facility] [address] [place] [employer], [place] [address] [phone] [8am-5pm] After hours: [phone]

## 2014-07-21 ENCOUNTER — Non-Acute Institutional Stay (SKILLED_NURSING_FACILITY): Payer: Medicare Other | Admitting: Internal Medicine

## 2014-07-21 DIAGNOSIS — I10 Essential (primary) hypertension: Secondary | ICD-10-CM

## 2014-07-21 DIAGNOSIS — K219 Gastro-esophageal reflux disease without esophagitis: Secondary | ICD-10-CM

## 2014-07-21 DIAGNOSIS — H8113 Benign paroxysmal vertigo, bilateral: Secondary | ICD-10-CM

## 2014-07-21 DIAGNOSIS — R0982 Postnasal drip: Secondary | ICD-10-CM

## 2014-07-21 DIAGNOSIS — E1129 Type 2 diabetes mellitus with other diabetic kidney complication: Secondary | ICD-10-CM

## 2014-07-21 DIAGNOSIS — H8110 Benign paroxysmal vertigo, unspecified ear: Secondary | ICD-10-CM | POA: Insufficient documentation

## 2014-07-21 DIAGNOSIS — N058 Unspecified nephritic syndrome with other morphologic changes: Secondary | ICD-10-CM

## 2014-07-21 NOTE — Progress Notes (Signed)
Patient ID: [name], female   DOB: [birth year], 80 y.o.   MRN: [id number]    Facility: [facility]   Chief Complaint  Patient presents with  . Medical Management of Chronic Issues   Allergies  Allergen Reactions  . Contrast Media [Iodinated Diagnostic Agents]     Unknown  . Penicillins     Unknown   HPI 80 y.o. female patient is seen for routine visit. She complaints of continuing ocassional dizziness with change in position of her head. She also has post nasal drip. Denies runny nose, cough or sore throat. She has PMH of MS s/p quadriplegia, ulcerative colitis, constipation, HTN, DM2, trigeminal neuralgia, vertigo. No change in behaviors or functional status reported by staff. cbg reading looks good. bp is controlled  Review of Systems  Constitutional: Negative for fever, chills, diaphoresis.  HENT: Negative for congestion, hearing loss and sore throat.   Eyes: Negative for blurred vision, double vision and discharge.  Respiratory: Negative for cough, shortness of breath and wheezing.   Cardiovascular: Negative for chest pain, palpitations, leg swelling.  Gastrointestinal: Negative for heartburn, nausea, vomiting, abdominal pain Genitourinary: Negative for dysuria Skin: Negative for itching and rash.  Neurological: Negative for tingling, headaches.  Psychiatric/Behavioral: Negative for depression and memory loss. The patient is not nervous/anxious.    Past Medical History  Diagnosis Date  . Hypertension   . Diabetes mellitus   . GERD (gastroesophageal reflux disease)   . Neuromuscular disorder   . Multiple sclerosis   . Anxiety   . Hyperlipemia   . Pneumonia   . Colitis   . Unable to ambulate   . GI bleed 09/07/2012  . Type II or unspecified type diabetes mellitus without mention of complication, uncontrolled 02/03/2013  . Ulcerative colitis, unspecified 02/03/2013     Medication List       This list is accurate as of: 07/21/14  4:12 PM.   Always use your most recent med list.               acetaminophen 325 MG tablet  Commonly known as:  TYLENOL  Take 650 mg by mouth every 4 (four) hours as needed.     ALPRAZolam 0.25 MG tablet  Commonly known as:  XANAX  Take 0.25 mg by mouth at bedtime as needed for sleep.     aspirin 81 MG tablet  Take 81 mg by mouth daily.     atorvastatin 20 MG tablet  Commonly known as:  LIPITOR  Take 20 mg by mouth See admin instructions. Takes 1 tablet daily on mondays, Wednesdays, and fridays.     cetirizine 10 MG tablet  Commonly known as:  ZYRTEC  Take 10 mg by mouth at bedtime.     cyanocobalamin 100 MCG tablet  Take 1 tablet (100 mcg total) by mouth daily.     fluticasone 50 MCG/ACT nasal spray  Commonly known as:  FLONASE  Place 2 sprays into both nostrils daily as needed.     gabapentin 100 MG capsule  Commonly known as:  NEURONTIN  Take 3 capsules (300 mg total) by mouth 3 (three) times daily.     glyBURIDE 2.5 MG tablet  Commonly known as:  DIABETA  Take 2.5 mg by mouth daily with breakfast.     ibuprofen 800 MG tablet  Commonly known as:  ADVIL,MOTRIN  Take 1 tablet (800 mg total) by mouth every 6 (six) hours as needed.     meclizine 25 MG  tablet  Commonly known as:  ANTIVERT  Take 1 tablet (25 mg total) by mouth 3 (three) times daily. And daily as needed     metFORMIN 500 MG 24 hr tablet  Commonly known as:  GLUCOPHAGE XR  Take 1 tablet (500 mg total) by mouth daily with breakfast.     metoprolol tartrate 25 MG tablet  Commonly known as:  LOPRESSOR  Take 25 mg by mouth 2 (two) times daily.     pantoprazole 40 MG tablet  Commonly known as:  PROTONIX  Take 40 mg by mouth daily.     polyethylene glycol packet  Commonly known as:  MIRALAX / GLYCOLAX  Take 17 g by mouth 2 (two) times daily.     pseudoephedrine 30 MG tablet  Commonly known as:  SUDAFED  Take 30 mg by mouth every 6 (six) hours as needed for congestion.     sulfaDIAZINE 500 MG tablet    Take 1,000 mg by mouth 2 (two) times daily.       Physical exam BP 108/64 mmHg  Pulse 66  Temp(Src) 97.6 F (36.4 C)  Resp 18  SpO2 95%  General- elderly female in no acute distress Head- atraumatic, normocephalic Eyes- PERRLA, EOMI, no pallor, no icterus, no discharge Neck- no lymphadenopathy Mouth- normal mucus membrane, has dentures Cardiovascular- normal s1,s2, no murmurs, normal distal pulses Respiratory- bilateral clear to auscultation, no wheeze, no rhonchi, no crackles Abdomen- bowel sounds present, soft, non tender Musculoskeletal- quadriplegia, restricted ROM at neck area, contracture in her feet,  no leg edema Neurological- alert and oriented Skin- warm and dry Psychiatry- normal mood and affect  Labs 06/20/14 Urine microalbuminuria positive 03/13/14 wbc 7.4, hb 12.6, hct 42, plt 257, a1c 6.7, na 134, k 4.9, bun 33, cr 0.7, t.chol 137, hdl 29, ldl 72, tg 178  Assessment/plan  Vertigo Improved but persists, on meclizine 25 mg tid for now. Slow head turn recommended. No nystagmus, no tinnitus. Her MS with quadriplegia could be contributing to this  Essential hypertension, benign Stable. Continue lopressor at 25mg  twice daily and monitor.   Post nasal drip On cetirizine and prn sudafed. D/c sudafed for now. Pt has been on allegra before with no relief. Will change her protonix to 40mg  twice daily and see if this helps with possible reflux symptoms presenting as Post nasal drip  Gastroesophageal reflux disease without esophagitis Stable. Change protonix 40mg  to twice daily and monitor  Diabetes mellitus type II, controlled, with renal manifestation Last a1c 6.7. Continue metformin xr 500mg  daily and glyburide 2.5mg  daily. has microalbuminuria. Add lisinopril 2.5 mg daily for now and monitor. uptodate on Foot (12/11/13) and eye exam (08/12/13). Continue statin and low dose asa. Continue to monitor. Check a1c

## 2014-07-21 NOTE — Progress Notes (Deleted)
Patient ID: [name], female   DOB: [birth year], 80 y.o.   MRN: [id number]    Facility: [facility]   Chief Complaint  Patient presents with  . Medical Management of Chronic Issues   Allergies  Allergen Reactions  . Contrast Media [Iodinated Diagnostic Agents]     Unknown  . Penicillins     Unknown   HPI 80 y.o. female patient is seen for routine visit. She complaints of continuing ocassional dizziness with change in position of her head. She also has post nasal drip. Denies runny nose, cough or sore throat. She has PMH of MS s/p quadriplegia, ulcerative colitis, constipation, HTN, DM2, trigeminal neuralgia, vertigo. No change in behaviors or functional status reported by staff. cbg reading looks good. bp is controlled  Review of Systems  Constitutional: Negative for fever, chills, diaphoresis.  HENT: Negative for congestion, hearing loss and sore throat.   Eyes: Negative for blurred vision, double vision and discharge.  Respiratory: Negative for cough, shortness of breath and wheezing.   Cardiovascular: Negative for chest pain, palpitations, leg swelling.  Gastrointestinal: Negative for heartburn, nausea, vomiting, abdominal pain Genitourinary: Negative for dysuria Skin: Negative for itching and rash.  Neurological: Negative for tingling, headaches.  Psychiatric/Behavioral: Negative for depression and memory loss. The patient is not nervous/anxious.    Past Medical History  Diagnosis Date  . Hypertension   . Diabetes mellitus   . GERD (gastroesophageal reflux disease)   . Neuromuscular disorder   . Multiple sclerosis   . Anxiety   . Hyperlipemia   . Pneumonia   . Colitis   . Unable to ambulate   . GI bleed 09/07/2012  . Type II or unspecified type diabetes mellitus without mention of complication, uncontrolled 02/03/2013  . Ulcerative colitis, unspecified 02/03/2013     Medication List       This list is accurate as of: 07/21/14  3:53 PM.   Always use your most recent med list.               acetaminophen 325 MG tablet  Commonly known as:  TYLENOL  Take 650 mg by mouth every 4 (four) hours as needed.     ALPRAZolam 0.25 MG tablet  Commonly known as:  XANAX  Take 0.25 mg by mouth at bedtime as needed for sleep.     aspirin 81 MG tablet  Take 81 mg by mouth daily.     atorvastatin 20 MG tablet  Commonly known as:  LIPITOR  Take 20 mg by mouth See admin instructions. Takes 1 tablet daily on mondays, Wednesdays, and fridays.     cetirizine 10 MG tablet  Commonly known as:  ZYRTEC  Take 10 mg by mouth at bedtime.     cyanocobalamin 100 MCG tablet  Take 1 tablet (100 mcg total) by mouth daily.     fluticasone 50 MCG/ACT nasal spray  Commonly known as:  FLONASE  Place 2 sprays into both nostrils daily as needed.     gabapentin 100 MG capsule  Commonly known as:  NEURONTIN  Take 3 capsules (300 mg total) by mouth 3 (three) times daily.     glyBURIDE 2.5 MG tablet  Commonly known as:  DIABETA  Take 2.5 mg by mouth daily with breakfast.     ibuprofen 800 MG tablet  Commonly known as:  ADVIL,MOTRIN  Take 1 tablet (800 mg total) by mouth every 6 (six) hours as needed.     meclizine 25 MG  tablet  Commonly known as:  ANTIVERT  Take 1 tablet (25 mg total) by mouth 3 (three) times daily. And daily as needed     metFORMIN 500 MG 24 hr tablet  Commonly known as:  GLUCOPHAGE XR  Take 1 tablet (500 mg total) by mouth daily with breakfast.     metoprolol tartrate 25 MG tablet  Commonly known as:  LOPRESSOR  Take 25 mg by mouth 2 (two) times daily.     pantoprazole 40 MG tablet  Commonly known as:  PROTONIX  Take 40 mg by mouth daily.     polyethylene glycol packet  Commonly known as:  MIRALAX / GLYCOLAX  Take 17 g by mouth 2 (two) times daily.     pseudoephedrine 30 MG tablet  Commonly known as:  SUDAFED  Take 30 mg by mouth every 6 (six) hours as needed for congestion.     sulfaDIAZINE 500 MG tablet    Take 1,000 mg by mouth 2 (two) times daily.       Physical exam BP 108/64 mmHg  Pulse 66  Temp(Src) 97.6 F (36.4 C)  Resp 18  SpO2 95%  General- elderly female in no acute distress Head- atraumatic, normocephalic Eyes- PERRLA, EOMI, no pallor, no icterus, no discharge Neck- no lymphadenopathy Mouth- normal mucus membrane, has dentures Cardiovascular- normal s1,s2, no murmurs, normal distal pulses Respiratory- bilateral clear to auscultation, no wheeze, no rhonchi, no crackles Abdomen- bowel sounds present, soft, non tender Musculoskeletal- quadriplegia, restricted ROM at neck area, contracture in her feet,  no leg edema Neurological- alert and oriented Skin- warm and dry Psychiatry- normal mood and affect  Labs 06/20/14 Urine microalbuminuria positive 03/13/14 wbc 7.4, hb 12.6, hct 42, plt 257, a1c 6.7, na 134, k 4.9, bun 33, cr 0.7, t.chol 137, hdl 29, ldl 72, tg 178  Assessment/plan  Vertigo Improved but persists, on meclizine 25 mg tid for now.   Essential hypertension, benign Stable. BP on soft side today. If continues to be low end of normal, will decrease lopressor dosage. Continue lopressor at 25mg  twice daily and monitor.   2. Allergic rhinitis, unspecified allergic rhinitis type Discontinue allegra. Start zyrtec 10mg  daily at bedtime and continue flonase 2 sprays into each nare daily. Continue to monitor  3. Ulcerative colitis, unspecified complication Stable. Continue sulfadiazine 1g twice daily and continue to monitor.  4. Gastroesophageal reflux disease without esophagitis Stable. Continue protonix 40mg  daily and monitor  5. Constipation, unspecified constipation type Stable. Continue miralax twice daily for constipation.  Continue to monitor.   6. Diabetes mellitus type II, controlled, with no complications Last I3B 6.7. CBG range 120-128. Continue metformin xr 500mg  daily and glyburide 2.5mg  daily.  Foot (12/11/13) and eye exam (08/12/13) are up to date.  Currently not on ace/arb, will check urine microalbumin/creatinine ratio. Continue statin and low dose asa. Continue to monitor.   7. MS (multiple sclerosis) Quadriplegia. Has special call bell at bedside. Continue skin care and pressure ulcer precautions. Continue to monitor  8. Trigeminal neuralgia pain Stable. Continue gabapentin 300mg  three times daily and monitor.   9. Dyslipidemia LDL at goal. Continue lipitor 20mg  three times weekly and monitor  10. Vertigo Persists. Could be secondary to left ear cerumen impaction. Continue meclizine 25mg  three times daily for now. Suggested ENT referral but patient declined at this point.   11. Cerumen impaction, left Nursing staff to irrigate left ear. Continue to monitor.

## 2014-07-22 LAB — HEMOGLOBIN A1C: Hgb A1c MFr Bld: 7.3 % — AB (ref 4.0–6.0)

## 2014-08-12 ENCOUNTER — Non-Acute Institutional Stay (SKILLED_NURSING_FACILITY): Payer: Medicare Other | Admitting: Registered Nurse

## 2014-08-12 DIAGNOSIS — B029 Zoster without complications: Secondary | ICD-10-CM

## 2014-08-12 NOTE — Progress Notes (Signed)
Patient ID: [name], female   DOB: 02-23-1935, 80 y.o.   MRN: [id number]   Place of Service: [facility]  Allergies  Allergen Reactions  . Contrast Media [Iodinated Diagnostic Agents]     Unknown  . Penicillins     Unknown    Code Status: Full Code  Goals of Care: Longevity/LTC   Chief Complaint  Patient presents with  . Acute Visit    rash on face    HPI 80 y.o. female with PMH of MS s/p quadriplegia, ulcerative colitis, constipation, HTN, DM2, trigeminal neuralgia, vertigo among others is being seen for an acute visit for vesicular rash on left side of face. Per patient, rash started since Sunday. Denies pain or itchiness associated with rash. Denies any change in medications or diet. No other complaints reported.   Review of Systems Constitutional: Negative for fever and chills HENT: Negative for ear pain, congestion, and sore throat Eyes: Negative for eye pain, eye discharge, and visual disturbance  Cardiovascular: Negative for chest pain, palpitations, and leg swelling Respiratory: Negative cough, shortness of breath, and wheezing.  Gastrointestinal: Negative for nausea and vomiting. Negative for abdominal pain.  Musculoskeletal: Negative for back pain, joint pain, and joint swelling  Neurological: Negative for headache. Positive for vertigo (everything spinning when turning head) Skin: See HPI  Psychiatric: Negative for depression  Past Medical History  Diagnosis Date  . Hypertension   . Diabetes mellitus   . GERD (gastroesophageal reflux disease)   . Neuromuscular disorder   . Multiple sclerosis   . Anxiety   . Hyperlipemia   . Pneumonia   . Colitis   . Unable to ambulate   . GI bleed 09/07/2012  . Type II or unspecified type diabetes mellitus without mention of complication, uncontrolled 02/03/2013  . Ulcerative colitis, unspecified 02/03/2013    Past Surgical History  Procedure Laterality Date  . Tubal ligation    . Flexible sigmoidoscopy   10/05/2011    Procedure: FLEXIBLE SIGMOIDOSCOPY;  Surgeon: Winfield Cunas., MD;  Location: [facility];  Service: Endoscopy;  Laterality: N/A;   pt. coming from [place] daughter phone no. [phone] might come with her  sedation is needed, pt. is paraplegic  . Esophagogastroduodenoscopy N/A 09/09/2012    Procedure: ESOPHAGOGASTRODUODENOSCOPY (EGD);  Surgeon: Winfield Cunas., MD;  Location: [facility];  Service: Endoscopy;  Laterality: N/A;  . Colonoscopy N/A 09/09/2012    Procedure: COLONOSCOPY;  Surgeon: Winfield Cunas., MD;  Location: [facility] END[facility]COPY;  Service: Endoscopy;  Laterality: N/A;  possible flex sig    History   Social History  . Marital Status: Married    Spouse Name: N/A    Number of Children: N/A  . Years of Education: N/A   Occupational History  . Not on file.   Social History Main Topics  . Smoking status: Former Smoker -- 1.50 packs/day for 30 years    Quit date: 11/16/1995  . Smokeless tobacco: Never Used  . Alcohol Use: No  . Drug Use: No  . Sexual Activity: Not on file   Other Topics Concern  . Not on file   Social History Narrative    Family History  Problem Relation Age of Onset  . CAD Mother   . Diabetes Daughter   . Diabetes Daughter   . Diabetes Son       Medication List       This list is accurate as of: 08/12/14  8:07 PM.  Always  use your most recent med list.               acetaminophen 325 MG tablet  Commonly known as:  TYLENOL  Take 650 mg by mouth every 4 (four) hours as needed.     ALPRAZolam 0.25 MG tablet  Commonly known as:  XANAX  Take 0.25 mg by mouth at bedtime as needed for sleep.     aspirin 81 MG tablet  Take 81 mg by mouth daily.     atorvastatin 20 MG tablet  Commonly known as:  LIPITOR  Take 20 mg by mouth See admin instructions. Takes 1 tablet daily on mondays, Wednesdays, and fridays.     cetirizine 10 MG tablet  Commonly known as:  ZYRTEC  Take 10 mg by mouth at  bedtime.     cyanocobalamin 100 MCG tablet  Take 1 tablet (100 mcg total) by mouth daily.     fluticasone 50 MCG/ACT nasal spray  Commonly known as:  FLONASE  Place 2 sprays into both nostrils daily as needed.     gabapentin 100 MG capsule  Commonly known as:  NEURONTIN  Take 3 capsules (300 mg total) by mouth 3 (three) times daily.     glyBURIDE 2.5 MG tablet  Commonly known as:  DIABETA  Take 2.5 mg by mouth daily with breakfast.     ibuprofen 800 MG tablet  Commonly known as:  ADVIL,MOTRIN  Take 1 tablet (800 mg total) by mouth every 6 (six) hours as needed.     meclizine 25 MG tablet  Commonly known as:  ANTIVERT  Take 1 tablet (25 mg total) by mouth 3 (three) times daily. And daily as needed     metFORMIN 500 MG 24 hr tablet  Commonly known as:  GLUCOPHAGE XR  Take 1 tablet (500 mg total) by mouth daily with breakfast.     metoprolol tartrate 25 MG tablet  Commonly known as:  LOPRESSOR  Take 25 mg by mouth 2 (two) times daily.     pantoprazole 40 MG tablet  Commonly known as:  PROTONIX  Take 40 mg by mouth daily.     polyethylene glycol packet  Commonly known as:  MIRALAX / GLYCOLAX  Take 17 g by mouth 2 (two) times daily.     pseudoephedrine 30 MG tablet  Commonly known as:  SUDAFED  Take 30 mg by mouth every 6 (six) hours as needed for congestion.     sulfaDIAZINE 500 MG tablet  Take 1,000 mg by mouth 2 (two) times daily.        Physical Exam  BP 118/70 mmHg  Pulse 80  Temp(Src) 98.1 F (36.7 C)  Resp 20  SpO2 95%  Constitutional: WDWN elderly female in no acute distress. Conversant and pleasant.  HEENT: Normocephalic and atraumatic. PERRL. EOM intact. No scleral icterus.  Neck: Supple and nontender. No lymphadenopathy, masses, or thyromegaly. No JVD or carotid bruits. Cardiac: Normal S1, S2. RRR without appreciable murmurs, rubs, or gallops. Distal pulses intact. No dependent edema.  Lungs: No respiratory distress. Breath sounds clear  bilaterally without rales, rhonchi, or wheezes. Abdomen: Audible bowel sounds in all quadrants. Soft, nontender, nondistended.   Musculoskeletal: Quadriplegia. Able to turn head but with limited neck ROM. No joint erythema or tenderness. Bilateral foot drop and contracture noted on exam Skin: Warm and dry. Ruptured vesicular lesions with crusting on left side of face following maxillary and mandibular divisions of trigeminal nerve noted on exam.  Neurological: Alert and  oriented to person, place, and time.  Psychiatric: Judgment and insight adequate. Appropriate mood and affect.   Labs Reviewed  CBC Latest Ref Rng 03/13/2014 09/10/2013 09/11/2012  WBC - 7.4 9.0 8.4  Hemoglobin 12.0 - 16.0 g/dL 12.6 13.0 9.5(L)  Hematocrit 36 - 46 % 42 42 31.2(L)  Platelets 150 - 399 K/L 257 298 296    CMP Latest Ref Rng 03/13/2014 09/10/2013 09/11/2012  Glucose 70 - 99 mg/dL - - 146(H)  BUN 4 - 21 mg/dL 33(A) 23(A) 8  Creatinine 0.5 - 1.1 mg/dL 0.7 0.6 0.40(L)  Sodium 137 - 147 mmol/L 134(A) 141 144  Potassium 3.4 - 5.3 mmol/L 4.9 4.3 3.4(L)  Chloride 96 - 112 mEq/L - - 115(H)  CO2 19 - 32 mEq/L - - 21  Calcium 8.4 - 10.5 mg/dL - - 8.7  Total Protein 6.0 - 8.3 g/dL - - 5.5(L)  Total Bilirubin 0.3 - 1.2 mg/dL - - 0.1(L)  Alkaline Phos 39 - 117 U/L - - 41  AST 0 - 37 U/L - - 13  ALT 0 - 35 U/L - - 6    Lab Results  Component Value Date   HGBA1C 6.7* 03/13/2014    Lab Results  Component Value Date   TSH 0.029* 11/17/2011    Lipid Panel     Component Value Date/Time   CHOL 137 03/13/2014   TRIG 178* 03/13/2014   LDLCALC 72 03/13/2014   Assessment & Plan 1. Shingles Start Valtrex 1000mg  three times daily x 7 days. Initiate contact precaution per facility's protocol. Continue to monitor her status.   Family/Staff Communication Plan of care discussed with resident and nursing staff. Resident and nursing staff verbalized understanding and agree with plan of care. No additional questions or concerns  reported.    Arthur Holms, MSN, AGNP-C [facility] [address] [place], [place] [address] [phone] [8am-5pm] After hours: [phone]

## 2014-08-18 ENCOUNTER — Non-Acute Institutional Stay (SKILLED_NURSING_FACILITY): Payer: Medicare Other | Admitting: Registered Nurse

## 2014-08-18 DIAGNOSIS — K51919 Ulcerative colitis, unspecified with unspecified complications: Secondary | ICD-10-CM

## 2014-08-18 DIAGNOSIS — H6123 Impacted cerumen, bilateral: Secondary | ICD-10-CM

## 2014-08-18 DIAGNOSIS — J309 Allergic rhinitis, unspecified: Secondary | ICD-10-CM

## 2014-08-18 DIAGNOSIS — I10 Essential (primary) hypertension: Secondary | ICD-10-CM

## 2014-08-18 DIAGNOSIS — H8113 Benign paroxysmal vertigo, bilateral: Secondary | ICD-10-CM

## 2014-08-18 DIAGNOSIS — E1121 Type 2 diabetes mellitus with diabetic nephropathy: Secondary | ICD-10-CM

## 2014-08-18 DIAGNOSIS — K219 Gastro-esophageal reflux disease without esophagitis: Secondary | ICD-10-CM

## 2014-08-18 DIAGNOSIS — E1129 Type 2 diabetes mellitus with other diabetic kidney complication: Secondary | ICD-10-CM

## 2014-08-18 DIAGNOSIS — G35 Multiple sclerosis: Secondary | ICD-10-CM

## 2014-08-18 DIAGNOSIS — E785 Hyperlipidemia, unspecified: Secondary | ICD-10-CM

## 2014-08-18 DIAGNOSIS — G500 Trigeminal neuralgia: Secondary | ICD-10-CM

## 2014-08-18 DIAGNOSIS — K5900 Constipation, unspecified: Secondary | ICD-10-CM

## 2014-08-18 DIAGNOSIS — G4700 Insomnia, unspecified: Secondary | ICD-10-CM

## 2014-08-18 DIAGNOSIS — H6092 Unspecified otitis externa, left ear: Secondary | ICD-10-CM

## 2014-08-18 NOTE — Progress Notes (Signed)
Patient ID: [name], female   DOB: 1935-03-22, 80 y.o.   MRN: [id number]   Place of Service: [facility]  Allergies  Allergen Reactions  . Contrast Media [Iodinated Diagnostic Agents]     Unknown  . Penicillins     Unknown    Code Status: Full Code  Goals of Care: Longevity/LTC   Chief Complaint  Patient presents with  . Medical Management of Chronic Issues    MS, DM2, vertigo, HTN, trigeminal neuralgia, GERD, constipation    HPI 80 y.o. female with PMH of MS s/p quadriplegia, ulcerative colitis, constipation, HTN, DM2, trigeminal neuralgia, vertigo among others is being seen for a routine visit for management of her chronic issues. Weight stable. No recent fall or skin concerns reported. No change in behaviors or functional status reported. No concerns from staff. Recent a1c 7.3 with CBG range 118-138. BP is well controlled on lopressor with BP range 90s-120s/60-80s. LDL at goal on statin. GERD stable on twice daily ppi. Vertigo is stable on meclizine. Trigeminal neuralgia stable on gabapentin, ibuprofen and tylenol prn. No issues with constipation. Seen in room today. Reported Left ear pain and problem staying asleep. Contributing trouble sleeping with meclizine and would like to decrease it from three times daily to twice daily. Denies other concerns.   Review of Systems Constitutional: Negative for fever, chills, and fatigue. HENT: Positive for left ear pain, Negative for congestion and sore throat Eyes: Negative for eye pain, eye discharge, and visual disturbance  Cardiovascular: Negative for chest pain, palpitations, and leg swelling Respiratory: Negative cough, shortness of breath, and wheezing.  Gastrointestinal: Negative for nausea and vomiting. Negative for abdominal pain. Positive for both diarrhea and constipation Genitourinary: Negative for dysuria and hematuria Endocrine: Negative for polydipsia, polyphagia, and polyuria Musculoskeletal: Negative for back  pain, joint pain, and joint swelling  Neurological: Negative for headache. Positive for vertigo (everything spinning when turning head) Skin: Negative for rash and wound.   Psychiatric: Negative for depression and anxiety. Positive for insomnia  Past Medical History  Diagnosis Date  . Hypertension   . Diabetes mellitus   . GERD (gastroesophageal reflux disease)   . Neuromuscular disorder   . Multiple sclerosis   . Anxiety   . Hyperlipemia   . Pneumonia   . Colitis   . Unable to ambulate   . GI bleed 09/07/2012  . Type II or unspecified type diabetes mellitus without mention of complication, uncontrolled 02/03/2013  . Ulcerative colitis, unspecified 02/03/2013    Past Surgical History  Procedure Laterality Date  . Tubal ligation    . Flexible sigmoidoscopy  10/05/2011    Procedure: FLEXIBLE SIGMOIDOSCOPY;  Surgeon: Winfield Cunas., MD;  Location: [facility];  Service: Endoscopy;  Laterality: N/A;   pt. coming from [place] daughter phone no. [phone] might come with her  sedation is needed, pt. is paraplegic  . Esophagogastroduodenoscopy N/A 09/09/2012    Procedure: ESOPHAGOGASTRODUODENOSCOPY (EGD);  Surgeon: Winfield Cunas., MD;  Location: [facility] ENDOSCOPY;  Service: Endoscopy;  Laterality: N/A;  . Colonoscopy N/A 09/09/2012    Procedure: COLONOSCOPY;  Surgeon: Winfield Cunas., MD;  Location: [facility] ENDOSCOPY;  Service: Endoscopy;  Laterality: N/A;  possible flex sig    History   Social History  . Marital Status: Married    Spouse Name: N/A    Number of Children: N/A  . Years of Education: N/A   Occupational History  . Not on file.   Social History Main  Topics  . Smoking status: Former Smoker -- 1.50 packs/day for 30 years    Quit date: 11/16/1995  . Smokeless tobacco: Never Used  . Alcohol Use: No  . Drug Use: No  . Sexual Activity: Not on file   Other Topics Concern  . Not on file   Social History Narrative    Family History    Problem Relation Age of Onset  . CAD Mother   . Diabetes Daughter   . Diabetes Daughter   . Diabetes Son       Medication List       This list is accurate as of: 08/18/14 10:20 PM.  Always use your most recent med list.               acetaminophen 325 MG tablet  Commonly known as:  TYLENOL  Take 650 mg by mouth every 4 (four) hours as needed.     ALPRAZolam 0.25 MG tablet  Commonly known as:  XANAX  Take 0.25 mg by mouth at bedtime as needed for sleep.     aspirin 81 MG tablet  Take 81 mg by mouth daily.     atorvastatin 20 MG tablet  Commonly known as:  LIPITOR  Take 20 mg by mouth See admin instructions. Takes 1 tablet daily on mondays, Wednesdays, and fridays.     cetirizine 10 MG tablet  Commonly known as:  ZYRTEC  Take 10 mg by mouth at bedtime.     cyanocobalamin 100 MCG tablet  Take 1 tablet (100 mcg total) by mouth daily.     fluticasone 50 MCG/ACT nasal spray  Commonly known as:  FLONASE  Place 2 sprays into both nostrils daily as needed.     gabapentin 100 MG capsule  Commonly known as:  NEURONTIN  Take 3 capsules (300 mg total) by mouth 3 (three) times daily.     glyBURIDE 2.5 MG tablet  Commonly known as:  DIABETA  Take 2.5 mg by mouth daily with breakfast.     ibuprofen 800 MG tablet  Commonly known as:  ADVIL,MOTRIN  Take 1 tablet (800 mg total) by mouth every 6 (six) hours as needed.     lisinopril 2.5 MG tablet  Commonly known as:  PRINIVIL,ZESTRIL  Take 2.5 mg by mouth daily.     meclizine 25 MG tablet  Commonly known as:  ANTIVERT  Take 25 mg by mouth 2 (two) times daily.     metFORMIN 500 MG 24 hr tablet  Commonly known as:  GLUCOPHAGE XR  Take 1 tablet (500 mg total) by mouth daily with breakfast.     metoprolol tartrate 25 MG tablet  Commonly known as:  LOPRESSOR  Take 25 mg by mouth 2 (two) times daily.     pantoprazole 40 MG tablet  Commonly known as:  PROTONIX  Take 40 mg by mouth 2 (two) times daily.     polyethylene  glycol packet  Commonly known as:  MIRALAX / GLYCOLAX  Take 17 g by mouth 2 (two) times daily.     sulfaDIAZINE 500 MG tablet  Take 1,000 mg by mouth 2 (two) times daily.     traZODone 50 MG tablet  Commonly known as:  DESYREL  Take 12.5 mg by mouth at bedtime.        Physical Exam  BP 102/68 mmHg  Pulse 86  Temp(Src) 97.1 F (36.2 C)  Resp 20  Ht 5\' 4"  (1.626 m)  Wt 135 lb 6.4 oz (61.417  kg)  BMI 23.23 kg/m2  SpO2 95%  Constitutional: WDWN adult/elderly female in no acute distress. Conversant and pleasant.  HEENT: Normocephalic and atraumatic. PERRL. EOM intact. No icterus. Right external auditory canals patent, auricles without lesions. Left ear partial occluded sticky brown cerumen. Left ear canal erythematous with cloudy yellow drainage. Left auricle erythematous with small open red lesion and crusting around tragus. Right ear canal with partial cerumen obstruction. No nasal discharge or sinus tenderness. Oral mucosa moist. Posterior pharynx clear of any exudate or lesions. Denture in place Neck: Supple and nontender. No lymphadenopathy, masses, or thyromegaly. No JVD or carotid bruits. Cardiac: Normal S1, S2. RRR without appreciable murmurs, rubs, or gallops. Distal pulses intact. No dependent edema.  Lungs: No respiratory distress. Breath sounds clear bilaterally without rales, rhonchi, or wheezes. Abdomen: Audible bowel sounds in all quadrants. Soft, nontender, nondistended.   Musculoskeletal: Quadriplegia. Able to turn head but with limited neck ROM. No joint erythema or tenderness. Bilateral foot drop and contracture noted on exam Skin: Warm and dry. No rash noted. No erythema.  Neurological: Alert and oriented to person, place, and time.  Psychiatric: Judgment and insight adequate. Appropriate mood and affect.   Labs Reviewed  CBC Latest Ref Rng 03/13/2014 09/10/2013 09/11/2012  WBC - 7.4 9.0 8.4  Hemoglobin 12.0 - 16.0 g/dL 12.6 13.0 9.5(L)  Hematocrit 36 - 46 % 42 42  31.2(L)  Platelets 150 - 399 K/L 257 298 296    CMP Latest Ref Rng 03/13/2014 09/10/2013 09/11/2012  Glucose 70 - 99 mg/dL - - 146(H)  BUN 4 - 21 mg/dL 33(A) 23(A) 8  Creatinine 0.5 - 1.1 mg/dL 0.7 0.6 0.40(L)  Sodium 137 - 147 mmol/L 134(A) 141 144  Potassium 3.4 - 5.3 mmol/L 4.9 4.3 3.4(L)  Chloride 96 - 112 mEq/L - - 115(H)  CO2 19 - 32 mEq/L - - 21  Calcium 8.4 - 10.5 mg/dL - - 8.7  Total Protein 6.0 - 8.3 g/dL - - 5.5(L)  Total Bilirubin 0.3 - 1.2 mg/dL - - 0.1(L)  Alkaline Phos 39 - 117 U/L - - 41  AST 0 - 37 U/L - - 13  ALT 0 - 35 U/L - - 6    Lab Results  Component Value Date   HGBA1C 7.3* 07/22/2014    Lipid Panel     Component Value Date/Time   CHOL 137 03/13/2014   TRIG 178* 03/13/2014   LDLCALC 72 03/13/2014   Assessment & Plan 1. Essential hypertension, benign Stable. Continue lisinopril 2.5mg  daily and lopressor 25mg  twice daily. Continue to monitor   2. Allergic rhinitis, unspecified allergic rhinitis type No issues. Continue zyrtec 10mg  daily at bedtime and flonase 2 sprays into each nare daily.   3. Ulcerative colitis, unspecified complication Stable. Continue sulfadiazine 1g twice daily and continue to monitor.  4. Gastroesophageal reflux disease without esophagitis Stable. Continue protonix 40mg  twice daily daily and monitor  5. Constipation, unspecified constipation type Stable. Continue miralax twice daily for constipation.  Encourage hydration. Continue to monitor.   6. Diabetes mellitus type II, controlled, with renal manifestation  Last a1c 7.3. CBG range 118-138. Will increase metformin xr from 500mg  daily to 1000mg  daily. Continue glyburide 2.5mg  daily.  Foot exam up to date. Will add patient to ophthalmology's list for eye exam. Continue low dose asa, statin, and acei. Continue weekly cbg and monitor her status   7. MS (multiple sclerosis) Quadriplegia. Has special call bell at bedside. Continue assist with ADL care with skin care  and pressure  ulcer precautions. Continue to monitor  8. Trigeminal neuralgia pain Stable. Continue gabapentin 300mg  three times daily, ibuprofen 800mg  every six hours as needed, and tylenol 650mg  every four hours as needed. Continue to monitor.   9. Dyslipidemia LDL at goal. Continue lipitor 20mg  three times weekly.  10. Vertigo Stable. Will decrease meclizine 25mg  to twice daily per resident's request. Continue to monitor   11. Cerumen impaction, bil Partial cerumen impaction. Debrox 2gtt BID x 4 days then nursing staff to irrigate both ears.   12. Left otitis externa Cipro 0.2% 0.14mL into left ear twice daily x 7 days. Continue to monitor  13. Insomnia Start trazodone 12.5mg  nightly. Reassess and continue to monitor.    Family/Staff Communication Plan of care discussed with resident and nursing staff. Resident and nursing staff verbalized understanding and agree with plan of care. No additional questions or concerns reported.    Arthur Holms, MSN, AGNP-C [facility] [address] [place], [place] [address] [phone] [8am-5pm] After hours: [phone]

## 2014-09-16 ENCOUNTER — Non-Acute Institutional Stay (SKILLED_NURSING_FACILITY): Payer: Medicare Other | Admitting: Registered Nurse

## 2014-09-16 DIAGNOSIS — G35D Multiple sclerosis, unspecified: Secondary | ICD-10-CM

## 2014-09-16 DIAGNOSIS — E785 Hyperlipidemia, unspecified: Secondary | ICD-10-CM | POA: Diagnosis not present

## 2014-09-16 DIAGNOSIS — K219 Gastro-esophageal reflux disease without esophagitis: Secondary | ICD-10-CM

## 2014-09-16 DIAGNOSIS — K5900 Constipation, unspecified: Secondary | ICD-10-CM

## 2014-09-16 DIAGNOSIS — G500 Trigeminal neuralgia: Secondary | ICD-10-CM | POA: Diagnosis not present

## 2014-09-16 DIAGNOSIS — R000 Tachycardia, unspecified: Secondary | ICD-10-CM | POA: Diagnosis not present

## 2014-09-16 DIAGNOSIS — G4700 Insomnia, unspecified: Secondary | ICD-10-CM | POA: Diagnosis not present

## 2014-09-16 DIAGNOSIS — J988 Other specified respiratory disorders: Secondary | ICD-10-CM

## 2014-09-16 DIAGNOSIS — H8113 Benign paroxysmal vertigo, bilateral: Secondary | ICD-10-CM | POA: Diagnosis not present

## 2014-09-16 DIAGNOSIS — E1121 Type 2 diabetes mellitus with diabetic nephropathy: Secondary | ICD-10-CM | POA: Diagnosis not present

## 2014-09-16 DIAGNOSIS — J309 Allergic rhinitis, unspecified: Secondary | ICD-10-CM | POA: Diagnosis not present

## 2014-09-16 DIAGNOSIS — G35 Multiple sclerosis: Secondary | ICD-10-CM | POA: Diagnosis not present

## 2014-09-16 DIAGNOSIS — K51919 Ulcerative colitis, unspecified with unspecified complications: Secondary | ICD-10-CM | POA: Diagnosis not present

## 2014-09-16 DIAGNOSIS — I10 Essential (primary) hypertension: Secondary | ICD-10-CM

## 2014-09-16 DIAGNOSIS — E1129 Type 2 diabetes mellitus with other diabetic kidney complication: Secondary | ICD-10-CM

## 2014-09-16 NOTE — Progress Notes (Signed)
Patient ID: [name], female   DOB: Aug 07, 1934, 80 y.o.   MRN: [id number]   Place of Service: [facility]  Allergies  Allergen Reactions  . Contrast Media [Iodinated Diagnostic Agents]     Unknown  . Penicillins     Unknown    Code Status: Full Code  Goals of Care: Longevity/LTC   Chief Complaint  Patient presents with  . Medical Management of Chronic Issues    DM2, MS, HTN, BPPV, trigeminal neuralgia, GERD, HLD, AR, ulcerative colitis     HPI 80 y.o. female with PMH of MS s/p quadriplegia, ulcerative colitis, constipation, HTN, DM2, trigeminal neuralgia, vertigo among others is being seen for a routine visit for management of her chronic issues. Has 3lb weight loss over the past 30 days, but overall stable. No recent fall or skin concerns reported. No change in behaviors or functional status reported. Per nursing staff, resident was placed on oxygen at 2 lpm last night as her O2 sat was in the 80s to low 90s last night.  Recent a1c 7.3 with CBG range 114-131. BP is well controlled on lopressor and lisinopril with BP range 90s-130s/50-70s. LDL at goal on statin. No issues with GERD on twice daily ppi.Trigeminal neuralgia stable on gabapentin, ibuprofen and tylenol prn. No issues with constipation. Seen in room today. Reported having cough with thick mucus. Would like to have meclizine discontinued, stating that it makes her feel "crazy". Denies any other concerns.   Review of Systems Constitutional: Negative for fever, chills, and fatigue. HENT: Negative for ear pain. Negative for congestion and sore throat Eyes: Negative for eye pain and eye discharge. Cardiovascular: Negative for chest pain, palpitations, and leg swelling Respiratory: Positive for productive cough. Negative for shortness of breath, and wheezing.  Gastrointestinal: Negative for nausea and vomiting. Negative for abdominal pain. Positive for both diarrhea and constipation Genitourinary: Negative for  dysuria and hematuria Endocrine: Negative for polydipsia, polyphagia, and polyuria Musculoskeletal: Negative for back pain, joint pain, and joint swelling  Neurological: Negative for headache. Positive for dizziness.  Skin: Negative for rash and wound.   Psychiatric: Negative for depression and anxiety. Positive for insomnia  Past Medical History  Diagnosis Date  . Hypertension   . Diabetes mellitus   . GERD (gastroesophageal reflux disease)   . Neuromuscular disorder   . Multiple sclerosis   . Anxiety   . Hyperlipemia   . Pneumonia   . Colitis   . Unable to ambulate   . GI bleed 09/07/2012  . Type II or unspecified type diabetes mellitus without mention of complication, uncontrolled 02/03/2013  . Ulcerative colitis, unspecified 02/03/2013    Past Surgical History  Procedure Laterality Date  . Tubal ligation    . Flexible sigmoidoscopy  10/05/2011    Procedure: FLEXIBLE SIGMOIDOSCOPY;  Surgeon: Winfield Cunas., MD;  Location: [facility];  Service: Endoscopy;  Laterality: N/A;   pt. coming from [place] daughter phone no. [phone] might come with her  sedation is needed, pt. is paraplegic  . Esophagogastroduodenoscopy N/A 09/09/2012    Procedure: ESOPHAGOGASTRODUODENOSCOPY (EGD);  Surgeon: Winfield Cunas., MD;  Location: [facility] ENDOSCOPY;  Service: Endoscopy;  Laterality: N/A;  . Colonoscopy N/A 09/09/2012    Procedure: COLONOSCOPY;  Surgeon: Winfield Cunas., MD;  Location: [facility] ENDOSCOPY;  Service: Endoscopy;  Laterality: N/A;  possible flex sig    History   Social History  . Marital Status: Married    Spouse Name: N/A  .  Number of Children: N/A  . Years of Education: N/A   Occupational History  . Not on file.   Social History Main Topics  . Smoking status: Former Smoker -- 1.50 packs/day for 30 years    Quit date: 11/16/1995  . Smokeless tobacco: Never Used  . Alcohol Use: No  . Drug Use: No  . Sexual Activity: Not on file    Other Topics Concern  . Not on file   Social History Narrative    Family History  Problem Relation Age of Onset  . CAD Mother   . Diabetes Daughter   . Diabetes Daughter   . Diabetes Son       Medication List       This list is accurate as of: 09/16/14  9:36 PM.  Always use your most recent med list.               acetaminophen 325 MG tablet  Commonly known as:  TYLENOL  Take 650 mg by mouth every 4 (four) hours as needed.     ALPRAZolam 0.25 MG tablet  Commonly known as:  XANAX  Take 0.25 mg by mouth at bedtime as needed for sleep.     aspirin 81 MG tablet  Take 81 mg by mouth daily.     atorvastatin 20 MG tablet  Commonly known as:  LIPITOR  Take 20 mg by mouth See admin instructions. Takes 1 tablet daily on mondays, Wednesdays, and fridays.     cetirizine 10 MG tablet  Commonly known as:  ZYRTEC  Take 10 mg by mouth at bedtime.     cyanocobalamin 100 MCG tablet  Take 1 tablet (100 mcg total) by mouth daily.     fluticasone 50 MCG/ACT nasal spray  Commonly known as:  FLONASE  Place 2 sprays into both nostrils daily as needed.     gabapentin 100 MG capsule  Commonly known as:  NEURONTIN  Take 3 capsules (300 mg total) by mouth 3 (three) times daily.     glyBURIDE 2.5 MG tablet  Commonly known as:  DIABETA  Take 2.5 mg by mouth daily with breakfast.     guaiFENesin 600 MG 12 hr tablet  Commonly known as:  MUCINEX  Take 600 mg by mouth 2 (two) times daily as needed for to loosen phlegm.     ibuprofen 800 MG tablet  Commonly known as:  ADVIL,MOTRIN  Take 1 tablet (800 mg total) by mouth every 6 (six) hours as needed.     lisinopril 2.5 MG tablet  Commonly known as:  PRINIVIL,ZESTRIL  Take 2.5 mg by mouth daily.     metformin 1000 MG (OSM) 24 hr tablet  Commonly known as:  FORTAMET  Take 1,000 mg by mouth 2 (two) times daily with a meal.     metoprolol tartrate 25 MG tablet  Commonly known as:  LOPRESSOR  Take 25 mg by mouth 2 (two) times  daily.     pantoprazole 40 MG tablet  Commonly known as:  PROTONIX  Take 40 mg by mouth daily.     polyethylene glycol packet  Commonly known as:  MIRALAX / GLYCOLAX  Take 17 g by mouth 2 (two) times daily.     sulfaDIAZINE 500 MG tablet  Take 1,000 mg by mouth 2 (two) times daily.     traZODone 50 MG tablet  Commonly known as:  DESYREL  Take 12.5 mg by mouth at bedtime.        Physical  Exam  BP 105/58 mmHg  Pulse 104  Temp(Src) 97.6 F (36.4 C)  Resp 20  Ht 5\' 4"  (1.626 m)  Wt 132 lb 12.8 oz (60.238 kg)  BMI 22.78 kg/m2  SpO2 91%  Constitutional: WDWN adult/elderly female in no acute distress. Conversant and pleasant.  HEENT: Normocephalic and atraumatic. PERRL. EOM intact. No icterus. No nasal discharge or sinus tenderness. Oral mucosa moist. Some drainage noted on posterior pharynx . Denture in place. Moderate amount of thick milky sputum noted on gown and the towel that was placed over her chest.  Neck: No lymphadenopathy, masses, or thyromegaly. No JVD or carotid bruits. Cardiac: Slightly tachycardic with regular rhythm. HR 106-110s. No appreciable murmurs, rubs, or gallops. Distal pulses intact. Trace dependent edema.  Lungs: No respiratory distress. Breath sounds coarse bilaterally without rales, rhonchi, or wheezes. Abdomen: Audible bowel sounds in all quadrants. Soft, nontender, nondistended.   Musculoskeletal: Quadriplegia. Able to turn head but with limited neck ROM. No joint erythema or tenderness. Bilateral foot drop and contracture noted on exam Skin: Warm and dry. No rash noted. No erythema.  Neurological: Alert and oriented to person, place, and time.  Psychiatric: Judgment and insight adequate. Appropriate mood and affect.   Labs Reviewed  CBC Latest Ref Rng 03/13/2014 09/10/2013 09/11/2012  WBC - 7.4 9.0 8.4  Hemoglobin 12.0 - 16.0 g/dL 12.6 13.0 9.5(L)  Hematocrit 36 - 46 % 42 42 31.2(L)  Platelets 150 - 399 K/L 257 298 296    CMP Latest Ref Rng  03/13/2014 09/10/2013 09/11/2012  Glucose 70 - 99 mg/dL - - 146(H)  BUN 4 - 21 mg/dL 33(A) 23(A) 8  Creatinine 0.5 - 1.1 mg/dL 0.7 0.6 0.40(L)  Sodium 137 - 147 mmol/L 134(A) 141 144  Potassium 3.4 - 5.3 mmol/L 4.9 4.3 3.4(L)  Chloride 96 - 112 mEq/L - - 115(H)  CO2 19 - 32 mEq/L - - 21  Calcium 8.4 - 10.5 mg/dL - - 8.7  Total Protein 6.0 - 8.3 g/dL - - 5.5(L)  Total Bilirubin 0.3 - 1.2 mg/dL - - 0.1(L)  Alkaline Phos 39 - 117 U/L - - 41  AST 0 - 37 U/L - - 13  ALT 0 - 35 U/L - - 6    Lab Results  Component Value Date   HGBA1C 7.3* 07/22/2014    Lipid Panel     Component Value Date/Time   CHOL 137 03/13/2014   TRIG 178* 03/13/2014   LDLCALC 72 03/13/2014   Assessment & Plan 1. Essential hypertension, benign Stable with some soft readings. Continue lisinopril 2.5mg  daily and lopressor 25mg  twice daily. Hold lopressor if BP <100/60 or HR <60. Continue to monitor   2. Allergic rhinitis, unspecified allergic rhinitis type Stable. Continue zyrtec 10mg  daily at bedtime and flonase 2 sprays into each nare daily.   3. Ulcerative colitis, unspecified complication Stable. Continue sulfadiazine 1g twice daily. Monitor clinically  4. Gastroesophageal reflux disease without esophagitis Stable. Will change protonix from 40mg  twice daily to 40mg  daily. Continue to monitor for symptoms  5. Constipation, unspecified constipation type Stable. Continue miralax twice daily for constipation.  Encourage hydration.  6. Diabetes mellitus type II, controlled, with renal manifestation  Last a1c 7.3. CBG range 114-131. Continue metformin 1000mg  twice daily and glyburide 2.5mg  daily.  Foot exam and eye exam up to date. Continue low dose asa, statin, and acei. Continue weekly cbg and monitor her status   7. MS (multiple sclerosis) Quadriplegia. Continue assist with ADL care. Pressure ulcer  precautions  8. Trigeminal neuralgia pain Stable. Continue gabapentin 300mg  three times daily, ibuprofen 800mg   every six hours as needed, and tylenol 650mg  every four hours as needed. Continue to monitor.  9. Dyslipidemia LDL at goal. Continue lipitor 20mg  three times weekly. Recheck lipid panel  10. Vertigo Stable. Will discontinue meclizine per resident's request. Continue to monitor her status.   11. Cough and congestion Discontinue sudafed. Start mucinex 600mg  twice daily x3 days then twice daily as needed for cough and to loosen up phlegm. Continue to monitor her status.   12. Tachycardia HR in 106-110s. Will check CBC w/ diff, cmp, tsh for further evaluation. Continue lopressor 25mg  twice daily. Hold for BP <100/60 and HR <60. Nursing staff to monitor VS qshift x 3 days then daily. Continue to monitor her status.   13. Insomnia Persists. Continue trazodone 25mg  daily at bedtime with xanax 0.25mg  daily at bedtime as needed . continue to monitor.   Labs ordered: cbc w/ dif, cmp, tsh, lipid panel  Family/Staff Communication Plan of care discussed with resident and nursing staff. Resident and nursing staff verbalized understanding and agree with plan of care. No additional questions or concerns reported.    Arthur Holms, MSN, AGNP-C [facility] [address] [place], [place] [address] [phone] [8am-5pm] After hours: [phone]

## 2014-09-17 LAB — BASIC METABOLIC PANEL
BUN: 30 mg/dL — AB (ref 4–21)
Creatinine: 0.5 mg/dL (ref 0.5–1.1)
GLUCOSE: 108 mg/dL
Potassium: 5.1 mmol/L (ref 3.4–5.3)
Sodium: 139 mmol/L (ref 137–147)

## 2014-09-17 LAB — LIPID PANEL
Cholesterol: 145 mg/dL (ref 0–200)
HDL: 28 mg/dL — AB (ref 35–70)
LDL CALC: 79 mg/dL
Triglycerides: 190 mg/dL — AB (ref 40–160)

## 2014-09-17 LAB — CBC AND DIFFERENTIAL
HEMATOCRIT: 39 % (ref 36–46)
Hemoglobin: 12.7 g/dL (ref 12.0–16.0)
Platelets: 300 10*3/uL (ref 150–399)
WBC: 7.1 10^3/mL

## 2014-09-17 LAB — TSH: TSH: 0.56 u[IU]/mL (ref 0.41–5.90)

## 2014-10-15 ENCOUNTER — Non-Acute Institutional Stay (SKILLED_NURSING_FACILITY): Payer: Medicare Other | Admitting: Registered Nurse

## 2014-10-15 ENCOUNTER — Encounter: Payer: Self-pay | Admitting: Registered Nurse

## 2014-10-15 DIAGNOSIS — G35D Multiple sclerosis, unspecified: Secondary | ICD-10-CM

## 2014-10-15 DIAGNOSIS — G35 Multiple sclerosis: Secondary | ICD-10-CM | POA: Diagnosis not present

## 2014-10-15 DIAGNOSIS — G4700 Insomnia, unspecified: Secondary | ICD-10-CM

## 2014-10-15 DIAGNOSIS — H8113 Benign paroxysmal vertigo, bilateral: Secondary | ICD-10-CM

## 2014-10-15 DIAGNOSIS — J309 Allergic rhinitis, unspecified: Secondary | ICD-10-CM

## 2014-10-15 DIAGNOSIS — E785 Hyperlipidemia, unspecified: Secondary | ICD-10-CM | POA: Diagnosis not present

## 2014-10-15 DIAGNOSIS — E1121 Type 2 diabetes mellitus with diabetic nephropathy: Secondary | ICD-10-CM

## 2014-10-15 DIAGNOSIS — K51919 Ulcerative colitis, unspecified with unspecified complications: Secondary | ICD-10-CM

## 2014-10-15 DIAGNOSIS — K219 Gastro-esophageal reflux disease without esophagitis: Secondary | ICD-10-CM

## 2014-10-15 DIAGNOSIS — G500 Trigeminal neuralgia: Secondary | ICD-10-CM | POA: Diagnosis not present

## 2014-10-15 DIAGNOSIS — K5900 Constipation, unspecified: Secondary | ICD-10-CM | POA: Diagnosis not present

## 2014-10-15 DIAGNOSIS — E1129 Type 2 diabetes mellitus with other diabetic kidney complication: Secondary | ICD-10-CM

## 2014-10-15 DIAGNOSIS — I10 Essential (primary) hypertension: Secondary | ICD-10-CM

## 2014-10-15 NOTE — Progress Notes (Signed)
Patient ID: [name], female   DOB: 1934/09/14, 80 y.o.   MRN: [id number]   Place of Service: [facility]  Allergies  Allergen Reactions  . Contrast Media [Iodinated Diagnostic Agents]     Unknown  . Penicillins     Unknown    Code Status: Full Code  Goals of Care: Longevity/LTC   Chief Complaint  Patient presents with  . Medical Management of Chronic Issues    Dm2, HTN, vertigo, trigeminal neuralgia, constipation, insominia, GERD, ulcerative colitis, HLD, AR    HPI 80 y.o. female with PMH of MS s/p quadriplegia, ulcerative colitis, constipation, HTN, DM2, trigeminal neuralgia, vertigo among others is being seen for a routine visit for management of her chronic issues. Weight stable over the past 30 days. No recent fall or skin concerns reported. No change in behaviors or functional status reported. DM2 adequately controlled with last a1c 7.3 with CBG range 116-194. BP range 90s-110s/60s. LDL at goal on statin. GERD stable on ppi.Trigeminal neuralgia stable on gabapentin, ibuprofen and tylenol prn. No issues with constipation. Seen in room today. Denies any concerns  Review of Systems Constitutional: Negative for fever, chills, and fatigue. HENT: Negative for ear pain. Negative for congestion and sore throat Eyes: Negative for eye pain and eye discharge. Cardiovascular: Negative for chest pain, palpitations, and leg swelling Respiratory: Negative for cough, shortness of breath, and wheezing.  Gastrointestinal: Negative for nausea and vomiting. Negative for abdominal pain. Positive for both diarrhea and constipation Genitourinary: Negative for dysuria and hematuria Endocrine: Negative for polydipsia, polyphagia, and polyuria Musculoskeletal: Negative for back pain, joint pain, and joint swelling  Neurological: Negative for headache. Positive for dizziness (much better since discontinuation of meclizine).  Skin: Negative for rash and wound.   Psychiatric: Negative for  depression and anxiety. Positive for insomnia  Past Medical History  Diagnosis Date  . Hypertension   . Diabetes mellitus   . GERD (gastroesophageal reflux disease)   . Neuromuscular disorder   . Multiple sclerosis   . Anxiety   . Hyperlipemia   . Pneumonia   . Colitis   . Unable to ambulate   . GI bleed 09/07/2012  . Type II or unspecified type diabetes mellitus without mention of complication, uncontrolled 02/03/2013  . Ulcerative colitis, unspecified 02/03/2013    Past Surgical History  Procedure Laterality Date  . Tubal ligation    . Flexible sigmoidoscopy  10/05/2011    Procedure: FLEXIBLE SIGMOIDOSCOPY;  Surgeon: Winfield Cunas., MD;  Location: [facility];  Service: Endoscopy;  Laterality: N/A;   pt. coming from [place] daughter phone no. [phone] might come with her  sedation is needed, pt. is paraplegic  . Esophagogastroduodenoscopy N/A 09/09/2012    Procedure: ESOPHAGOGASTRODUODENOSCOPY (EGD);  Surgeon: Winfield Cunas., MD;  Location: [facility] ENDOSCOPY;  Service: Endoscopy;  Laterality: N/A;  . Colonoscopy N/A 09/09/2012    Procedure: COLONOSCOPY;  Surgeon: Winfield Cunas., MD;  Location: [facility] ENDOSCOPY;  Service: Endoscopy;  Laterality: N/A;  possible flex sig    History   Social History  . Marital Status: Married    Spouse Name: N/A  . Number of Children: N/A  . Years of Education: N/A   Occupational History  . Not on file.   Social History Main Topics  . Smoking status: Former Smoker -- 1.50 packs/day for 30 years    Quit date: 11/16/1995  . Smokeless tobacco: Never Used  . Alcohol Use: No  . Drug Use: No  .  Sexual Activity: Not on file   Other Topics Concern  . Not on file   Social History Narrative    Family History  Problem Relation Age of Onset  . CAD Mother   . Diabetes Daughter   . Diabetes Daughter   . Diabetes Son       Medication List       This list is accurate as of: 10/15/14  4:18 PM.  Always use  your most recent med list.               acetaminophen 325 MG tablet  Commonly known as:  TYLENOL  Take 650 mg by mouth every 4 (four) hours as needed.     ALPRAZolam 0.25 MG tablet  Commonly known as:  XANAX  Take 0.25 mg by mouth at bedtime as needed for sleep.     aspirin 81 MG tablet  Take 81 mg by mouth daily.     atorvastatin 20 MG tablet  Commonly known as:  LIPITOR  Take 20 mg by mouth See admin instructions. Takes 1 tablet daily on mondays, Wednesdays, and fridays.     cetirizine 10 MG tablet  Commonly known as:  ZYRTEC  Take 10 mg by mouth at bedtime.     cyanocobalamin 100 MCG tablet  Take 1 tablet (100 mcg total) by mouth daily.     fluticasone 50 MCG/ACT nasal spray  Commonly known as:  FLONASE  Place 2 sprays into both nostrils daily as needed.     gabapentin 100 MG capsule  Commonly known as:  NEURONTIN  Take 3 capsules (300 mg total) by mouth 3 (three) times daily.     glyBURIDE 2.5 MG tablet  Commonly known as:  DIABETA  Take 2.5 mg by mouth daily with breakfast.     guaiFENesin 600 MG 12 hr tablet  Commonly known as:  MUCINEX  Take 600 mg by mouth 2 (two) times daily as needed for to loosen phlegm.     ibuprofen 800 MG tablet  Commonly known as:  ADVIL,MOTRIN  Take 1 tablet (800 mg total) by mouth every 6 (six) hours as needed.     lisinopril 2.5 MG tablet  Commonly known as:  PRINIVIL,ZESTRIL  Take 2.5 mg by mouth daily.     metformin 1000 MG (OSM) 24 hr tablet  Commonly known as:  FORTAMET  Take 1,000 mg by mouth 2 (two) times daily with a meal.     metoprolol tartrate 25 MG tablet  Commonly known as:  LOPRESSOR  Take 25 mg by mouth 2 (two) times daily.     pantoprazole 40 MG tablet  Commonly known as:  PROTONIX  Take 40 mg by mouth daily.     polyethylene glycol packet  Commonly known as:  MIRALAX / GLYCOLAX  Take 17 g by mouth 2 (two) times daily.     sulfaDIAZINE 500 MG tablet  Take 1,000 mg by mouth 2 (two) times daily.       traZODone 50 MG tablet  Commonly known as:  DESYREL  Take 25 mg by mouth at bedtime.        Physical Exam  BP 100/70 mmHg  Pulse 84  Temp(Src) 97.6 F (36.4 C)  Resp 16  Ht 5\' 4"  (1.626 m)  Wt 132 lb 12.8 oz (60.238 kg)  BMI 22.78 kg/m2  SpO2 97%  Constitutional: Thin elderly female in no acute distress. Conversant and pleasant.  HEENT: Normocephalic and atraumatic. PERRL. EOM intact. No icterus.  No nasal discharge or sinus tenderness. Oral mucosa moist. Posterior pharynx w/o obstruction/drainage Neck: No lymphadenopathy, masses, or thyromegaly. No JVD or carotid bruits. Cardiac: Normal S1, S2, RRR. No appreciable murmurs, rubs, or gallops. Distal pulses intact. Trace dependent edema.  Lungs: No respiratory distress. Breath sounds clear bilaterally without rales, rhonchi, or wheezes. Abdomen: Audible bowel sounds in all quadrants. Soft, nontender, nondistended.   Musculoskeletal: Quadriplegia. Able to turn head but with limited neck ROM. No joint erythema or tenderness. Bilateral foot drop, muscle atrophy, and contracture noted on exam Skin: Warm and dry. No rash noted. No erythema.  Neurological: Alert and oriented to self Psychiatric: Judgment and insight adequate. Appropriate mood and affect.   Labs Reviewed  CBC Latest Ref Rng 09/17/2014 03/13/2014 09/10/2013  WBC - 7.1 7.4 9.0  Hemoglobin 12.0 - 16.0 g/dL 12.7 12.6 13.0  Hematocrit 36 - 46 % 39 42 42  Platelets 150 - 399 K/L 300 257 298    CMP Latest Ref Rng 09/17/2014 03/13/2014 09/10/2013  Glucose 70 - 99 mg/dL - - -  BUN 4 - 21 mg/dL 30(A) 33(A) 23(A)  Creatinine 0.5 - 1.1 mg/dL 0.5 0.7 0.6  Sodium 137 - 147 mmol/L 139 134(A) 141  Potassium 3.4 - 5.3 mmol/L 5.1 4.9 4.3  Chloride 96 - 112 mEq/L - - -  CO2 19 - 32 mEq/L - - -  Calcium 8.4 - 10.5 mg/dL - - -  Total Protein 6.0 - 8.3 g/dL - - -  Total Bilirubin 0.3 - 1.2 mg/dL - - -  Alkaline Phos 39 - 117 U/L - - -  AST 0 - 37 U/L - - -  ALT 0 - 35 U/L - - -    Lab  Results  Component Value Date   HGBA1C 7.3* 07/22/2014    Lipid Panel     Component Value Date/Time   CHOL 145 09/17/2014   TRIG 190* 09/17/2014   HDL 28* 09/17/2014   LDLCALC 79 09/17/2014   Assessment & Plan 1. Essential hypertension, benign Stable. Continue lisinopril 2.5mg  daily and lopressor 25mg  twice daily. Hold lopressor if BP <100/60 or HR <60. Continue to monitor   2. Allergic rhinitis, unspecified allergic rhinitis type Stable. Continue zyrtec 10mg  daily at bedtime and flonase 2 sprays into each nare daily.   3. Ulcerative colitis, unspecified complication Stable. Continue sulfadiazine 1g twice daily.   4. Gastroesophageal reflux disease without esophagitis Stable. Continue protonix 40mg  daily.   5. Constipation, unspecified constipation type Stable. Continue miralax twice daily for constipation.  Encourage hydration.  6. Diabetes mellitus type II, controlled, with renal manifestation  Last a1c 7.3. CBG range 116-194. Continue metformin 1000mg  twice daily and glyburide 2.5mg  daily.  Foot exam and eye exam up to date. Continue low dose asa, statin, and acei. Continue weekly cbg and monitor her status. Recheck A1c  7. MS (multiple sclerosis) Quadriplegia. Continue assist with ADLs care and pressure ulcer precautions  8. Trigeminal neuralgia pain No issues. Continue gabapentin 300mg  three times daily with ibuprofen 800mg  every six hours as needed and tylenol 650mg  every four hours as needed for pain.   9. Dyslipidemia LDL 79. Continue lipitor 20mg  three times weekly.   10. Vertigo Improving since discontinuation of meclizine. Monitor clinically   11. Insomnia Stable. Continue trazodone 25mg  daily at bedtime with xanax 0.25mg  daily at bedtime as needed . continue to monitor.   Labs ordered: a1c  Family/Staff Communication Plan of care discussed with resident and nursing staff. Resident and nursing  staff verbalized understanding and agree with plan of care. No  additional questions or concerns reported.    Arthur Holms, MSN, AGNP-C [facility] [address] [place], [place] [address] [phone] [8am-5pm] After hours: [phone]

## 2014-10-16 LAB — HEMOGLOBIN A1C: Hgb A1c MFr Bld: 6.8 % — AB (ref 4.0–6.0)

## 2014-11-11 ENCOUNTER — Encounter: Payer: Self-pay | Admitting: Registered Nurse

## 2014-11-11 ENCOUNTER — Non-Acute Institutional Stay (SKILLED_NURSING_FACILITY): Payer: Medicare Other | Admitting: Registered Nurse

## 2014-11-11 DIAGNOSIS — G35 Multiple sclerosis: Secondary | ICD-10-CM

## 2014-11-11 DIAGNOSIS — E785 Hyperlipidemia, unspecified: Secondary | ICD-10-CM | POA: Diagnosis not present

## 2014-11-11 DIAGNOSIS — G4700 Insomnia, unspecified: Secondary | ICD-10-CM | POA: Diagnosis not present

## 2014-11-11 DIAGNOSIS — J309 Allergic rhinitis, unspecified: Secondary | ICD-10-CM

## 2014-11-11 DIAGNOSIS — G500 Trigeminal neuralgia: Secondary | ICD-10-CM

## 2014-11-11 DIAGNOSIS — E1129 Type 2 diabetes mellitus with other diabetic kidney complication: Secondary | ICD-10-CM

## 2014-11-11 DIAGNOSIS — J988 Other specified respiratory disorders: Secondary | ICD-10-CM

## 2014-11-11 DIAGNOSIS — Z0000 Encounter for general adult medical examination without abnormal findings: Secondary | ICD-10-CM

## 2014-11-11 DIAGNOSIS — K51919 Ulcerative colitis, unspecified with unspecified complications: Secondary | ICD-10-CM | POA: Diagnosis not present

## 2014-11-11 DIAGNOSIS — K219 Gastro-esophageal reflux disease without esophagitis: Secondary | ICD-10-CM | POA: Diagnosis not present

## 2014-11-11 DIAGNOSIS — I10 Essential (primary) hypertension: Secondary | ICD-10-CM

## 2014-11-11 DIAGNOSIS — G35D Multiple sclerosis, unspecified: Secondary | ICD-10-CM

## 2014-11-11 DIAGNOSIS — N058 Unspecified nephritic syndrome with other morphologic changes: Secondary | ICD-10-CM | POA: Diagnosis not present

## 2014-11-11 NOTE — Progress Notes (Signed)
Patient ID: [name], female   DOB: 03-12-1935, 80 y.o.   MRN: [id number]   Place of Service: [facility]  Allergies  Allergen Reactions  . Contrast Media [Iodinated Diagnostic Agents]     Unknown  . Penicillins     Unknown    Code Status: Full Code  Goals of Care: Longevity/LTC   Chief Complaint  Patient presents with  . Annual Exam    HPI 80 y.o. female with PMH of MS s/p quadriplegia, ulcerative colitis, constipation, HTN, DM2, trigeminal neuralgia, vertigo among others is being seen for an annual health exam and routine visit for management of her chronic issues. Has 3 lbs weight loss over the past 30 days. No recent fall or skin concerns reported. No change in behaviors or functional status reported. No concerns from staff. Is up to date with pna and influenza vaccines and diabetic foot/eye exam. DM2 adequately controlled with recent A1c 6.8. BP range 90s-110s/50-60s. LDL at goal on statin. Trigeminal neuralgia stable on gabapentin, ibuprofen and tylenol prn. GERD stable on ppi. Seen in room today. Reported congestion and coughing up thick secretions. Denies any other concerns.   Review of Systems Constitutional: Negative for fever, chills, and fatigue. HENT: Negative for ear pain. See HPI Eyes: Negative for eye pain and eye discharge. Cardiovascular: Negative for chest pain, palpitations, and leg swelling Respiratory: Negative for cough, shortness of breath, and wheezing.  Gastrointestinal: Negative for nausea and vomiting. Negative for abdominal pain. Positive for both diarrhea and constipation Genitourinary: Negative for dysuria and hematuria Endocrine: Negative for polydipsia, polyphagia, and polyuria Musculoskeletal: Negative for back pain, joint pain, and joint swelling  Neurological: Negative for headache and dizziness Skin: Negative for rash and wound.   Psychiatric: Negative for depression and anxiety.   Past Medical History  Diagnosis Date  .  Hypertension   . Diabetes mellitus   . GERD (gastroesophageal reflux disease)   . Neuromuscular disorder   . Multiple sclerosis   . Anxiety   . Hyperlipemia   . Pneumonia   . Colitis   . Unable to ambulate   . GI bleed 09/07/2012  . Type II or unspecified type diabetes mellitus without mention of complication, uncontrolled 02/03/2013  . Ulcerative colitis, unspecified 02/03/2013    Past Surgical History  Procedure Laterality Date  . Tubal ligation    . Flexible sigmoidoscopy  10/05/2011    Procedure: FLEXIBLE SIGMOIDOSCOPY;  Surgeon: Winfield Cunas., MD;  Location: [facility];  Service: Endoscopy;  Laterality: N/A;   pt. coming from [place] daughter phone no. [phone] might come with her  sedation is needed, pt. is paraplegic  . Esophagogastroduodenoscopy N/A 09/09/2012    Procedure: ESOPHAGOGASTRODUODENOSCOPY (EGD);  Surgeon: Winfield Cunas., MD;  Location: [facility] ENDOSCOPY;  Service: Endoscopy;  Laterality: N/A;  . Colonoscopy N/A 09/09/2012    Procedure: COLONOSCOPY;  Surgeon: Winfield Cunas., MD;  Location: [facility] ENDOSCOPY;  Service: Endoscopy;  Laterality: N/A;  possible flex sig    History   Social History  . Marital Status: Married    Spouse Name: N/A  . Number of Children: N/A  . Years of Education: N/A   Occupational History  . Not on file.   Social History Main Topics  . Smoking status: Former Smoker -- 1.50 packs/day for 30 years    Quit date: 11/16/1995  . Smokeless tobacco: Never Used  . Alcohol Use: No  . Drug Use: No  . Sexual Activity: Not on  file   Other Topics Concern  . Not on file   Social History Narrative    Family History  Problem Relation Age of Onset  . CAD Mother   . Diabetes Daughter   . Diabetes Daughter   . Diabetes Son       Medication List       This list is accurate as of: 11/11/14 10:41 PM.  Always use your most recent med list.               acetaminophen 325 MG tablet  Commonly  known as:  TYLENOL  Take 650 mg by mouth every 4 (four) hours as needed.     ALPRAZolam 0.25 MG tablet  Commonly known as:  XANAX  Take 0.25 mg by mouth at bedtime as needed for sleep.     aspirin 81 MG tablet  Take 81 mg by mouth daily.     atorvastatin 20 MG tablet  Commonly known as:  LIPITOR  Take 20 mg by mouth See admin instructions. Takes 1 tablet daily on mondays, Wednesdays, and fridays.     calcium-vitamin D 500-400 MG-UNIT per tablet  Commonly known as:  OSCAL-500  Take 1 tablet by mouth 2 (two) times daily.     cetirizine 10 MG tablet  Commonly known as:  ZYRTEC  Take 10 mg by mouth at bedtime.     cyanocobalamin 100 MCG tablet  Take 1 tablet (100 mcg total) by mouth daily.     fluticasone 50 MCG/ACT nasal spray  Commonly known as:  FLONASE  Place 2 sprays into both nostrils daily as needed.     gabapentin 100 MG capsule  Commonly known as:  NEURONTIN  Take 3 capsules (300 mg total) by mouth 3 (three) times daily.     glyBURIDE 2.5 MG tablet  Commonly known as:  DIABETA  Take 2.5 mg by mouth daily with breakfast.     guaiFENesin 600 MG 12 hr tablet  Commonly known as:  MUCINEX  Take 600 mg by mouth 2 (two) times daily as needed for to loosen phlegm.     ibuprofen 800 MG tablet  Commonly known as:  ADVIL,MOTRIN  Take 1 tablet (800 mg total) by mouth every 6 (six) hours as needed.     lisinopril 2.5 MG tablet  Commonly known as:  PRINIVIL,ZESTRIL  Take 2.5 mg by mouth daily.     metformin 1000 MG (OSM) 24 hr tablet  Commonly known as:  FORTAMET  Take 1,000 mg by mouth 2 (two) times daily with a meal.     metoprolol tartrate 25 MG tablet  Commonly known as:  LOPRESSOR  Take 25 mg by mouth 2 (two) times daily.     pantoprazole 40 MG tablet  Commonly known as:  PROTONIX  Take 40 mg by mouth daily.     polyethylene glycol packet  Commonly known as:  MIRALAX / GLYCOLAX  Take 17 g by mouth 2 (two) times daily.     sulfaDIAZINE 500 MG tablet  Take  1,000 mg by mouth 2 (two) times daily.     traZODone 50 MG tablet  Commonly known as:  DESYREL  Take 25 mg by mouth at bedtime.        Physical Exam  BP 110/60 mmHg  Pulse 98  Temp(Src) 98 F (36.7 C)  Resp 16  Ht 5\' 4"  (1.626 m)  Wt 129 lb 4.8 oz (58.65 kg)  BMI 22.18 kg/m2  SpO2 94%  Constitutional:  Frail elderly female in no acute distress. Conversant and pleasant.  HEENT: Normocephalic and atraumatic. PERRL. EOM intact. No icterus. No nasal discharge or sinus tenderness. Oral mucosa moist. Thick clear drainage noted on posterior pharynx  Neck: No lymphadenopathy, masses, or thyromegaly. No JVD or carotid bruits. Cardiac: Normal S1, S2, RRR. No appreciable murmurs, rubs, or gallops. Distal pulses intact. Trace dependent edema.  Lungs: No respiratory distress. Breath sounds clear bilaterally without rales, rhonchi, or wheezes. Abdomen: Audible bowel sounds in all quadrants. Soft, nontender, nondistended.   Musculoskeletal: Quadriplegia. Able to turn head but with limited neck ROM. No joint erythema or tenderness. Bilateral foot drop, muscle atrophy, and contracture noted on exam Skin: Warm and dry. No rash noted.  Neurological: Alert and oriented to self Psychiatric: Judgment and insight adequate. Appropriate mood and affect.   Labs Reviewed  CBC Latest Ref Rng 09/17/2014 03/13/2014 09/10/2013  WBC - 7.1 7.4 9.0  Hemoglobin 12.0 - 16.0 g/dL 12.7 12.6 13.0  Hematocrit 36 - 46 % 39 42 42  Platelets 150 - 399 K/L 300 257 298    CMP Latest Ref Rng 09/17/2014 03/13/2014 09/10/2013  Glucose 70 - 99 mg/dL - - -  BUN 4 - 21 mg/dL 30(A) 33(A) 23(A)  Creatinine 0.5 - 1.1 mg/dL 0.5 0.7 0.6  Sodium 137 - 147 mmol/L 139 134(A) 141  Potassium 3.4 - 5.3 mmol/L 5.1 4.9 4.3  Chloride 96 - 112 mEq/L - - -  CO2 19 - 32 mEq/L - - -  Calcium 8.4 - 10.5 mg/dL - - -  Total Protein 6.0 - 8.3 g/dL - - -  Total Bilirubin 0.3 - 1.2 mg/dL - - -  Alkaline Phos 39 - 117 U/L - - -  AST 0 - 37 U/L - - -    ALT 0 - 35 U/L - - -    Lab Results  Component Value Date   HGBA1C 6.8* 10/16/2014    Lipid Panel     Component Value Date/Time   CHOL 145 09/17/2014   TRIG 190* 09/17/2014   HDL 28* 09/17/2014   LDLCALC 79 09/17/2014   Assessment & Plan 1.  Annual wellness exam. Up do date with influenza (04/25/14) and pna (04/24/12) vaccines. Not a candidate for mammography. FOBT x3 annually in may for colon cancer screening. Start oscal 500/400mg  twice daily for bone health. Continue to offer resident recommended immunizations and screenings as appropriate for age and health status  2. Essential hypertension, benign Controlled. Continue lisinopril 2.5mg  daily and lopressor 25mg  twice daily. Hold lopressor if BP <100/60 or HR <60. Monitor bp  3. Allergic rhinitis, unspecified allergic rhinitis type Stable. Continue zyrtec 10mg  daily at bedtime and flonase 2 sprays into each nare daily.   4. Ulcerative colitis, unspecified complication Stable. Continue sulfadiazine 1g twice daily.   5. Gastroesophageal reflux disease without esophagitis Stable. Continue protonix 40mg  daily.   6. Diabetes mellitus type II, controlled, with renal manifestation  Recent a1c 6.8 from 7.3. Continue metformin 1000mg  twice daily and glyburide 2.5mg  daily.  Foot exam and eye exam up to date. Continue low dose asa, statin, and acei. Continue weekly cbg and monitor her status.   7. MS (multiple sclerosis) Quadriplegia. Continue assist with ADLs care and pressure ulcer precautions  8. Trigeminal neuralgia pain Stable. Continue gabapentin 300mg  three times daily,, ibuprofen 800mg  every six hours as needed, and tylenol 650mg  every four hours as needed for pain.   9. Dyslipidemia LDL 79. Continue lipitor 20mg  three times weekly.  10. Insomnia Stable. Continue trazodone 25mg  daily at bedtime with xanax 0.25mg  daily at bedtime as needed for anxiety  11. Chest congestion mucinex 600mg  twice daily x 1 week then twice  daily as needed for cough and congestion. Encourage hydration.    Family/Staff Communication Plan of care discussed with resident and nursing staff. Resident and nursing staff verbalized understanding and agree with plan of care. No additional questions or concerns reported.    Arthur Holms, MSN, AGNP-C [facility] [address] [place], [place] [address] [phone] [8am-5pm] After hours: [phone]

## 2015-01-01 ENCOUNTER — Non-Acute Institutional Stay (SKILLED_NURSING_FACILITY): Payer: Medicare Other | Admitting: Internal Medicine

## 2015-01-01 DIAGNOSIS — E119 Type 2 diabetes mellitus without complications: Secondary | ICD-10-CM

## 2015-01-01 DIAGNOSIS — IMO0001 Reserved for inherently not codable concepts without codable children: Secondary | ICD-10-CM | POA: Insufficient documentation

## 2015-01-01 DIAGNOSIS — K6389 Other specified diseases of intestine: Secondary | ICD-10-CM | POA: Diagnosis not present

## 2015-01-01 DIAGNOSIS — E782 Mixed hyperlipidemia: Secondary | ICD-10-CM

## 2015-01-01 DIAGNOSIS — K529 Noninfective gastroenteritis and colitis, unspecified: Secondary | ICD-10-CM | POA: Insufficient documentation

## 2015-01-01 DIAGNOSIS — R809 Proteinuria, unspecified: Secondary | ICD-10-CM

## 2015-01-01 DIAGNOSIS — K219 Gastro-esophageal reflux disease without esophagitis: Secondary | ICD-10-CM | POA: Diagnosis not present

## 2015-01-01 DIAGNOSIS — I10 Essential (primary) hypertension: Secondary | ICD-10-CM

## 2015-01-01 DIAGNOSIS — R0989 Other specified symptoms and signs involving the circulatory and respiratory systems: Secondary | ICD-10-CM | POA: Diagnosis not present

## 2015-01-01 NOTE — Progress Notes (Signed)
Patient ID: [name], female   DOB: 12-18-1934, 80 y.o.   MRN: [id number]    Facility: [facility]   Chief Complaint  Patient presents with  . Medical Management of Chronic Issues    routine visit   Allergies  Allergen Reactions  . Contrast Media [Iodinated Diagnostic Agents]     Unknown  . Penicillins     Unknown   Code status: full code  HPI 80 y.o. female patient is seen for routine visit. She complaints of choking sensation in her throat from mucus as she is not able to cough and bring it out. She feels congested in her chest. Her daughter had concerns of brown colored phlegm but patient denies any brown colored phlegm. She mentions it to be white. Denies any runny nose or sore throat. Breathing is stable. No change in behaviors or functional status reported by staff. Few low bp reading of 98/66, 98/70, highest reading of 134/66. No cbg reading for review She has PMH of MS s/p quadriplegia, ulcerative colitis, constipation, HTN, DM2, trigeminal neuralgia, vertigo.   Review of Systems  Constitutional: Negative for fever, chills, diaphoresis.  HENT: Negative for hearing loss and sore throat.   Eyes: Negative for blurred vision, double vision and discharge.  Respiratory: Negative for shortness of breath and wheezing.   Cardiovascular: Negative for chest pain, palpitations, leg swelling.  Gastrointestinal: Negative for heartburn, nausea, vomiting, abdominal pain Genitourinary: Negative for dysuria Skin: Negative for itching and rash.  Neurological: Negative for tingling, headaches.  Psychiatric/Behavioral: Negative for depression and memory loss. The patient is not nervous/anxious.    Past Medical History  Diagnosis Date  . Hypertension   . Diabetes mellitus   . GERD (gastroesophageal reflux disease)   . Neuromuscular disorder   . Multiple sclerosis   . Anxiety   . Hyperlipemia   . Pneumonia   . Colitis   . Unable to ambulate   . GI bleed  09/07/2012  . Type II or unspecified type diabetes mellitus without mention of complication, uncontrolled 02/03/2013  . Ulcerative colitis, unspecified 02/03/2013     Medication List       This list is accurate as of: 01/01/15  3:48 PM.  Always use your most recent med list.               acetaminophen 325 MG tablet  Commonly known as:  TYLENOL  Take 650 mg by mouth every 4 (four) hours as needed.     ALPRAZolam 0.25 MG tablet  Commonly known as:  XANAX  Take 0.25 mg by mouth at bedtime as needed for sleep.     aspirin 81 MG tablet  Take 81 mg by mouth daily.     atorvastatin 20 MG tablet  Commonly known as:  LIPITOR  Take 20 mg by mouth See admin instructions. Takes 1 tablet daily on mondays, Wednesdays, and fridays.     cetirizine 10 MG tablet  Commonly known as:  ZYRTEC  Take 10 mg by mouth at bedtime.     cyanocobalamin 100 MCG tablet  Take 1 tablet (100 mcg total) by mouth daily.     fluticasone 50 MCG/ACT nasal spray  Commonly known as:  FLONASE  Place 2 sprays into both nostrils daily as needed.     gabapentin 100 MG capsule  Commonly known as:  NEURONTIN  Take 3 capsules (300 mg total) by mouth 3 (three) times daily.     glipiZIDE 5 MG tablet  Commonly known as:  GLUCOTROL  Take by mouth daily before breakfast.     guaiFENesin 600 MG 12 hr tablet  Commonly known as:  MUCINEX  Take 600 mg by mouth 2 (two) times daily as needed for to loosen phlegm.     ibuprofen 800 MG tablet  Commonly known as:  ADVIL,MOTRIN  Take 1 tablet (800 mg total) by mouth every 6 (six) hours as needed.     lisinopril 2.5 MG tablet  Commonly known as:  PRINIVIL,ZESTRIL  Take 2.5 mg by mouth daily.     metformin 1000 MG (OSM) 24 hr tablet  Commonly known as:  FORTAMET  Take 1,000 mg by mouth 2 (two) times daily with a meal.     metoprolol tartrate 25 MG tablet  Commonly known as:  LOPRESSOR  Take 25 mg by mouth 2 (two) times daily.     mirtazapine 7.5 MG tablet  Commonly  known as:  REMERON  Take 7.5 mg by mouth at bedtime.     pantoprazole 40 MG tablet  Commonly known as:  PROTONIX  Take 40 mg by mouth daily.     polyethylene glycol packet  Commonly known as:  MIRALAX / GLYCOLAX  Take 17 g by mouth 2 (two) times daily.     sulfaDIAZINE 500 MG tablet  Take 1,000 mg by mouth 2 (two) times daily.     traZODone 50 MG tablet  Commonly known as:  DESYREL  Take 25 mg by mouth at bedtime.       Physical exam BP 118/64 mmHg  Pulse 78  Temp(Src) 97 F (36.1 C)  Resp 19  SpO2 95%  General- elderly female in no acute distress Head- atraumatic, normocephalic Eyes- PERRLA, EOMI, no pallor, no icterus, no discharge Neck- no lymphadenopathy, no JVD Mouth- normal mucus membrane, has dentures Cardiovascular- normal s1,s2, no murmurs, normal distal pulses Respiratory- bilateral clear to auscultation, no wheeze, no rhonchi, no crackles Abdomen- bowel sounds present, soft, non tender Musculoskeletal- quadriplegia, restricted ROM at neck area, contracture in her feet,  no leg edema Neurological- alert and oriented Skin- warm and dry Psychiatry- normal mood and affect  Labs 10/16/14 a1c 6.8 09/17/14 na 139, k 5.1, glu 108, bun 30, cr 0.52, t.chol 145, tg 190, hdl 28, ldl 79, tsh 0.564 09/12/14 wbc 7.1, hb 12.7,plt 300 06/20/14 Urine microalbuminuria positive 03/13/14 wbc 7.4, hb 12.6, hct 42, plt 257, a1c 6.7, na 134, k 4.9, bun 33, cr 0.7, t.chol 137, hdl 29, ldl 72, tg 178  Assessment/plan  Cough with chest congestion Poor cough reflex, unable to bring her phlegm out. Will try duoneb bid to help loosen the mucus followed by suction bid for a week and reassess. Spoke about this with nursing supervisor. Will have therapy team evaluate for pulmonary toileting as well. Continue humibid bid prn cough/congestion.  Essential hypertension, benign Stable with few low reading. Currently on lisinopril 2.5 mg daily with lopressor 25 mg bid.  decrease lopressor to 12.5  mg bid. Check bp twice daily and adjust dosing further if needed.    Mixed hyperlipidemia Reviewed lipid panel, continue lipitor 20 mg daily  Diabetes mellitus type II, controlled, with renal manifestation a1c 6.8 in 4/16. No recent cbg reading. Currently on glipizide 5 mg daily with metformin 1000 mg bid. Has microalbuminuria. Continue lisinopril 2.5 mg daily with lipitor 20 mg daily and baby aspirin for now and monitor. uptodate on Foot (12/11/13) and eye exam (08/12/13). Will need cbg check daily for now.  gerd Continue pantoprazole 40 mg daily  IBD Continue her sulfasalazine for now  Blanchie Serve, MD  [facility] [phone] (Monday-Friday 8 am - 5 pm) [phone] (afterhours)

## 2015-01-22 ENCOUNTER — Non-Acute Institutional Stay (SKILLED_NURSING_FACILITY): Payer: Medicare Other | Admitting: Internal Medicine

## 2015-01-22 DIAGNOSIS — J209 Acute bronchitis, unspecified: Secondary | ICD-10-CM | POA: Diagnosis not present

## 2015-01-22 NOTE — Progress Notes (Signed)
Patient ID: [name], female   DOB: 1934/08/22, 80 y.o.   MRN: [id number]      Facility: [facility]   Chief Complaint  Patient presents with  . Acute Visit    increased cough, ear ache, runny nose   Allergies  Allergen Reactions  . Contrast Media [Iodinated Diagnostic Agents]     Unknown  . Penicillins     Unknown   Code status: full code  HPI 80 y.o. female patient is seen for acute visit. She complaints of increased cough with right ear ache, runny nose for 2 days. She does not feel good. She feels stuffed up. She has coughed out yellow colored mucus noted on her towel. Denies any sore throat. Breathing is stable. She has PMH of MS s/p quadriplegia, ulcerative colitis, constipation, HTN, DM2, trigeminal neuralgia, vertigo.  Review of Systems  Constitutional: Negative for fever, chills, diaphoresis.  HENT: positive for right earache. Negative for hearing loss, drainage and sore throat.   Eyes: Negative for blurred vision, double vision and discharge.  Respiratory: Negative for wheezing.   Cardiovascular: Negative for chest pain Skin: Negative for itching and rash.  Neurological: Negative for headaches.   Past Medical History  Diagnosis Date  . Hypertension   . Diabetes mellitus   . GERD (gastroesophageal reflux disease)   . Neuromuscular disorder   . Multiple sclerosis   . Anxiety   . Hyperlipemia   . Pneumonia   . Colitis   . Unable to ambulate   . GI bleed 09/07/2012  . Type II or unspecified type diabetes mellitus without mention of complication, uncontrolled 02/03/2013  . Ulcerative colitis, unspecified 02/03/2013    Physical exam  BP 112/56 mmHg  Pulse 93  Temp(Src) 97.7 F (36.5 C)  Resp 20  SpO2 98%  General- elderly frailfemale in no acute distress Head- atraumatic, normocephalic Eyes- no pallor, no icterus, no discharge Neck- no lymphadenopathy Mouth- normal mucus membrane, has dentures, yellow mucus being coughed  up Cardiovascular- normal s1,s2, no murmurs, normal distal pulses Respiratory- bilateral poor air entry with rhonchi on left side, no wheeze, no crackles Abdomen- bowel sounds present, soft, non tender Musculoskeletal- quadriplegia, restricted ROM at neck area, contracture in her feet,  no leg edema Neurological- alert and oriented Skin- warm and dry Psychiatry- normal mood and affect   Assessment/plan  Acute bronchitis With earache, runny nose, increased cough and sputum production and rhonchi on exam. Start azithromycin 5 days course with robitussin DM and claritin 10 mg daily x 5 days and reassess.  Blanchie Serve, MD  [facility] [phone] (Monday-Friday 8 am - 5 pm) [phone] (afterhours)

## 2015-02-16 ENCOUNTER — Non-Acute Institutional Stay (SKILLED_NURSING_FACILITY): Payer: Medicare Other | Admitting: Nurse Practitioner

## 2015-02-16 DIAGNOSIS — K5900 Constipation, unspecified: Secondary | ICD-10-CM

## 2015-02-16 DIAGNOSIS — E1129 Type 2 diabetes mellitus with other diabetic kidney complication: Secondary | ICD-10-CM | POA: Diagnosis not present

## 2015-02-16 DIAGNOSIS — D649 Anemia, unspecified: Secondary | ICD-10-CM

## 2015-02-16 DIAGNOSIS — K219 Gastro-esophageal reflux disease without esophagitis: Secondary | ICD-10-CM

## 2015-02-16 DIAGNOSIS — G500 Trigeminal neuralgia: Secondary | ICD-10-CM

## 2015-02-16 DIAGNOSIS — R110 Nausea: Secondary | ICD-10-CM | POA: Diagnosis not present

## 2015-02-16 DIAGNOSIS — R634 Abnormal weight loss: Secondary | ICD-10-CM | POA: Diagnosis not present

## 2015-02-16 DIAGNOSIS — G35 Multiple sclerosis: Secondary | ICD-10-CM

## 2015-02-16 DIAGNOSIS — N058 Unspecified nephritic syndrome with other morphologic changes: Secondary | ICD-10-CM | POA: Diagnosis not present

## 2015-02-16 DIAGNOSIS — I10 Essential (primary) hypertension: Secondary | ICD-10-CM | POA: Diagnosis not present

## 2015-02-16 DIAGNOSIS — G8250 Quadriplegia, unspecified: Secondary | ICD-10-CM | POA: Diagnosis not present

## 2015-02-16 NOTE — Progress Notes (Signed)
Patient ID: [name], female   DOB: March 30, 1935, 80 y.o.   MRN: [id number]    Nursing Home Location:  [place] of Service: SNF (31)  PCP: Blanchie Serve, MD  Allergies  Allergen Reactions  . Contrast Media [Iodinated Diagnostic Agents]     Unknown  . Penicillins     Unknown    Chief Complaint  Patient presents with  . Medical Management of Chronic Issues    HPI:  Patient is a 80 y.o. female seen today at [facility] for routine follow up on chronic condition. She has PMH of MS with quadriplegia, ulcerative colitis, constipation, HTN, DM2, trigeminal neuralgia, vertigo. Pt reports ongoing feelings on increase mucous and congestion to throat. Has been treated for bronchitis without much improvement. Pt also notes increase nausea. Still able to eat but just get nauseous frequently. No vomiting.  Blood pressures variable at this time and overall controlled No issues with bowel or bladder Nursing without acute concerns.   Review of Systems:  Review of Systems  Constitutional: Positive for unexpected weight change (weight loss). Negative for activity change, appetite change and fatigue.  HENT: Negative for hearing loss.   Eyes: Negative.   Respiratory: Negative for cough and shortness of breath.        Congestion  Cardiovascular: Negative for chest pain, palpitations and leg swelling.  Gastrointestinal: Negative for abdominal pain, diarrhea and constipation.  Genitourinary: Negative for dysuria and difficulty urinating.  Musculoskeletal: Negative for myalgias and arthralgias.  Skin: Negative for color change and wound.  Neurological: Negative for dizziness and weakness.       Quadriplegia   Psychiatric/Behavioral: Negative for behavioral problems, confusion and agitation.    Past Medical History  Diagnosis Date  . Hypertension   . Diabetes mellitus   . GERD (gastroesophageal reflux disease)   . Neuromuscular disorder   .  Multiple sclerosis   . Anxiety   . Hyperlipemia   . Pneumonia   . Colitis   . Unable to ambulate   . GI bleed 09/07/2012  . Type II or unspecified type diabetes mellitus without mention of complication, uncontrolled 02/03/2013  . Ulcerative colitis, unspecified 02/03/2013   Past Surgical History  Procedure Laterality Date  . Tubal ligation    . Flexible sigmoidoscopy  10/05/2011    Procedure: FLEXIBLE SIGMOIDOSCOPY;  Surgeon: Winfield Cunas., MD;  Location: [facility];  Service: Endoscopy;  Laterality: N/A;   pt. coming from [place] daughter phone no. [phone] might come with her  sedation is needed, pt. is paraplegic  . Esophagogastroduodenoscopy N/A 09/09/2012    Procedure: ESOPHAGOGASTRODUODENOSCOPY (EGD);  Surgeon: Winfield Cunas., MD;  Location: [facility] END[facility]COPY;  Service: Endoscopy;  Laterality: N/A;  . Colonoscopy N/A 09/09/2012    Procedure: COLONOSCOPY;  Surgeon: Winfield Cunas., MD;  Location: [facility] ENDOSCOPY;  Service: Endoscopy;  Laterality: N/A;  possible flex sig   Social History:   reports that she quit smoking about 19 years ago. She has never used smokeless tobacco. She reports that she does not drink alcohol or use illicit drugs.  Family History  Problem Relation Age of Onset  . CAD Mother   . Diabetes Daughter   . Diabetes Daughter   . Diabetes Son     Medications: Patient's Medications  New Prescriptions   No medications on file  Previous Medications   ACETAMINOPHEN (TYLENOL) 325 MG TABLET    Take 650 mg by  mouth every 4 (four) hours as needed.   ALPRAZOLAM (XANAX) 0.25 MG TABLET    Take 0.25 mg by mouth at bedtime as needed for sleep.   ASPIRIN 81 MG TABLET    Take 81 mg by mouth daily.   ATORVASTATIN (LIPITOR) 20 MG TABLET    Take 20 mg by mouth See admin instructions. Takes 1 tablet daily on mondays, Wednesdays, and fridays.   CETIRIZINE (ZYRTEC) 10 MG TABLET    Take 10 mg by mouth at bedtime.   FLUTICASONE (FLONASE) 50  MCG/ACT NASAL SPRAY    Place 2 sprays into both nostrils daily as needed.    GABAPENTIN (NEURONTIN) 100 MG CAPSULE    Take 3 capsules (300 mg total) by mouth 3 (three) times daily.   GLIPIZIDE (GLUCOTROL) 5 MG TABLET    Take by mouth daily before breakfast.   GUAIFENESIN (MUCINEX) 600 MG 12 HR TABLET    Take 600 mg by mouth 2 (two) times daily as needed for to loosen phlegm.   IBUPROFEN (ADVIL,MOTRIN) 800 MG TABLET    Take 1 tablet (800 mg total) by mouth every 6 (six) hours as needed.   LISINOPRIL (PRINIVIL,ZESTRIL) 2.5 MG TABLET    Take 2.5 mg by mouth daily.   METFORMIN (FORTAMET) 1000 MG (OSM) 24 HR TABLET    Take 1,000 mg by mouth 2 (two) times daily with a meal.   METOPROLOL TARTRATE (LOPRESSOR) 25 MG TABLET    Take 25 mg by mouth 2 (two) times daily.   MIRTAZAPINE (REMERON) 7.5 MG TABLET    Take 7.5 mg by mouth at bedtime.   PANTOPRAZOLE (PROTONIX) 40 MG TABLET    Take 40 mg by mouth daily.    POLYETHYLENE GLYCOL (MIRALAX / GLYCOLAX) PACKET    Take 17 g by mouth 2 (two) times daily.   SULFADIAZINE 500 MG TABLET    Take 1,000 mg by mouth 2 (two) times daily.   TRAZODONE (DESYREL) 50 MG TABLET    Take 25 mg by mouth at bedtime.    VITAMIN B-12 100 MCG TABLET    Take 1 tablet (100 mcg total) by mouth daily.  Modified Medications   No medications on file  Discontinued Medications   No medications on file     Physical Exam: There were no vitals filed for this visit.  Physical Exam  Constitutional: She is oriented to person, place, and time. No distress.  HENT:  Head: Normocephalic and atraumatic.  Nose: Nose normal.  Mouth/Throat: Oropharynx is clear and moist. No oropharyngeal exudate.  Eyes: Conjunctivae are normal. Pupils are equal, round, and reactive to light.  Neck: Normal range of motion. Neck supple.  Cardiovascular: Normal rate, regular rhythm and normal heart sounds.   Pulmonary/Chest: Effort normal and breath sounds normal.  Abdominal: Soft. Bowel sounds are normal.    Musculoskeletal: She exhibits no edema.  Quadriplegia due to MS   Neurological: She is alert and oriented to person, place, and time.  Skin: Skin is warm and dry. She is not diaphoretic.  Psychiatric: She has a normal mood and affect.    Labs reviewed: Basic Metabolic Panel:  Recent Labs  03/13/14 09/17/14  NA 134* 139  K 4.9 5.1  BUN 33* 30*  CREATININE 0.7 0.5   Liver Function Tests: No results for input(s): AST, ALT, ALKPHOS, BILITOT, PROT, ALBUMIN in the last 8760 hours. No results for input(s): LIPASE, AMYLASE in the last 8760 hours. No results for input(s): AMMONIA in the last 8760 hours.  CBC:  Recent Labs  03/13/14 09/17/14  WBC 7.4 7.1  HGB 12.6 12.7  HCT 42 39  PLT 257 300   TSH:  Recent Labs  09/17/14  TSH 0.56   A1C: Lab Results  Component Value Date   HGBA1C 6.8* 10/16/2014   Lipid Panel:  Recent Labs  03/13/14 09/17/14  CHOL 137 145  HDL  --  28*  LDLCALC 72 79  TRIG 178* 190*     Assessment/Plan 1. DM (diabetes mellitus) type II controlled with renal manifestation Blood sugars being checked weekly, recent A1c at goal at 6.8, due to nausea will dc metformin and glipizide which could be contributing, will start tradjenta 5 mg daily   2. MS (multiple sclerosis) Advanced disease with quadriplegia, no increase in pain or muscle spasms.  -will start mucinex BID to help with chest congestion  3. Quadriplegia Due to MS, full support from staff, conts with pressure ulcer precautions   4. Trigeminal neuralgia pain Stable, cont on gabapentin 300 mg TID   5. Anemia, unspecified anemia type hgb stable on last labs, will follow up cbc  6. Gastroesophageal reflux disease, esophagitis presence not specified -without symptom of GERD, conts on protonix   7. Constipation, unspecified constipation type -well controlled on current regimen   8. Essential hypertension, benign Taking lisinopril and metformin, blood pressure vary from  95-120s/40-70s  9. Nausea without vomiting Pt reports has been ongoing for several months, reports it got better a year ago. daughter Questions medications. -will stop Lipitor, glipizide, metformin at this time to see if this improves symptoms  10. Loss of weight Ongoing weight loss despite Remeron, will dc at this time. RD following and has liberalized diet to regular with fortified foods.     Carlos American. Harle Battiest  [facility] [phone] 8 am - 5 pm) [phone] (after hours)

## 2015-02-26 LAB — HEMOGLOBIN A1C: HEMOGLOBIN A1C: 6.9 % — AB (ref 4.0–6.0)

## 2015-03-18 ENCOUNTER — Non-Acute Institutional Stay (SKILLED_NURSING_FACILITY): Payer: Medicare Other | Admitting: Nurse Practitioner

## 2015-03-18 DIAGNOSIS — N058 Unspecified nephritic syndrome with other morphologic changes: Secondary | ICD-10-CM

## 2015-03-18 DIAGNOSIS — G500 Trigeminal neuralgia: Secondary | ICD-10-CM | POA: Diagnosis not present

## 2015-03-18 DIAGNOSIS — I10 Essential (primary) hypertension: Secondary | ICD-10-CM

## 2015-03-18 DIAGNOSIS — E785 Hyperlipidemia, unspecified: Secondary | ICD-10-CM

## 2015-03-18 DIAGNOSIS — H8113 Benign paroxysmal vertigo, bilateral: Secondary | ICD-10-CM

## 2015-03-18 DIAGNOSIS — E1129 Type 2 diabetes mellitus with other diabetic kidney complication: Secondary | ICD-10-CM

## 2015-03-18 MED ORDER — LINAGLIPTIN 5 MG PO TABS: 5.0000 mg | ORAL_TABLET | Freq: Every day | ORAL | Status: DC

## 2015-03-18 MED ORDER — METFORMIN HCL 500 MG PO TABS: 500.0000 mg | ORAL_TABLET | Freq: Every day | ORAL | Status: DC

## 2015-03-18 NOTE — Progress Notes (Signed)
Patient ID: [name], female   DOB: September 20, 1934, 80 y.o.   MRN: [id number]    Nursing Home Location:  [place] of Service: SNF (31)  PCP: Blanchie Serve, MD  Allergies  Allergen Reactions  . Contrast Media [Iodinated Diagnostic Agents]     Unknown  . Penicillins     Unknown    Chief Complaint  Patient presents with  . Medical Management of Chronic Issues    HPI:  Patient is a 80 y.o. female seen today at [facility] who is seen today for medical management of her chronic issues.  She has a past medical history of hypertension, diabetes, multiple sclerosis and quadriplegia.  She is concerned today about her blood sugars, stating that they have been higher since her metformin and glyburide were stopped.  They were discontinued in August due to the patient complaining of nausea and having a loss of appetite.  A review of her morning pre prandial blood sugars shows they have been in the 190-215 range. Nausea has improved with increase in weight noted.  She also says that she has had frequent headaches, every afternoon.  This began about 3 weeks ago.  Headaches are a dull pain and she has not asked for anything from nursing to relieve them.  No blurred vision or changes in vision.  No weakness.    Review of Systems:  Review of Systems  Constitutional: Negative for chills, diaphoresis and fatigue.  Respiratory: Negative for cough, shortness of breath and wheezing.   Cardiovascular: Negative for chest pain, palpitations and leg swelling.  Gastrointestinal: Negative for abdominal pain, diarrhea and constipation.  Genitourinary: Negative.   Musculoskeletal: Negative for myalgias, joint swelling and arthralgias.  Skin: Negative.   Neurological: Positive for headaches. Negative for dizziness, weakness, light-headedness and numbness.  Psychiatric/Behavioral: Negative.     Past Medical History  Diagnosis Date  . Hypertension   . Diabetes  mellitus   . GERD (gastroesophageal reflux disease)   . Neuromuscular disorder   . Multiple sclerosis   . Anxiety   . Hyperlipemia   . Pneumonia   . Colitis   . Unable to ambulate   . GI bleed 09/07/2012  . Type II or unspecified type diabetes mellitus without mention of complication, uncontrolled 02/03/2013  . Ulcerative colitis, unspecified 02/03/2013   Past Surgical History  Procedure Laterality Date  . Tubal ligation    . Flexible sigmoidoscopy  10/05/2011    Procedure: FLEXIBLE SIGMOIDOSCOPY;  Surgeon: Winfield Cunas., MD;  Location: [facility];  Service: Endoscopy;  Laterality: N/A;   pt. coming from [place] daughter phone no. [phone] might come with her  sedation is needed, pt. is paraplegic  . Esophagogastroduodenoscopy N/A 09/09/2012    Procedure: ESOPHAGOGASTRODUODENOSCOPY (EGD);  Surgeon: Winfield Cunas., MD;  Location: [facility];  Service: Endoscopy;  Laterality: N/A;  . Colonoscopy N/A 09/09/2012    Procedure: COLONOSCOPY;  Surgeon: Winfield Cunas., MD;  Location: [facility] ENDOSCOPY;  Service: Endoscopy;  Laterality: N/A;  possible flex sig   Social History:   reports that she quit smoking about 19 years ago. She has never used smokeless tobacco. She reports that she does not drink alcohol or use illicit drugs.  Family History  Problem Relation Age of Onset  . CAD Mother   . Diabetes Daughter   . Diabetes Daughter   . Diabetes Son     Medications: Patient's Medications  New  Prescriptions   LINAGLIPTIN (TRADJENTA) 5 MG TABS TABLET    Take 1 tablet (5 mg total) by mouth daily.   METFORMIN (GLUCOPHAGE) 500 MG TABLET    Take 1 tablet (500 mg total) by mouth daily with breakfast.  Previous Medications   ACETAMINOPHEN (TYLENOL) 325 MG TABLET    Take 650 mg by mouth every 4 (four) hours as needed.   ALPRAZOLAM (XANAX) 0.25 MG TABLET    Take 0.25 mg by mouth at bedtime as needed for sleep.   ASPIRIN 81 MG TABLET    Take 81 mg by  mouth daily.   ATORVASTATIN (LIPITOR) 20 MG TABLET    Take 20 mg by mouth See admin instructions. Takes 1 tablet daily on mondays, Wednesdays, and fridays.   CETIRIZINE (ZYRTEC) 10 MG TABLET    Take 10 mg by mouth at bedtime.   FLUTICASONE (FLONASE) 50 MCG/ACT NASAL SPRAY    Place 2 sprays into both nostrils daily as needed.    GABAPENTIN (NEURONTIN) 100 MG CAPSULE    Take 3 capsules (300 mg total) by mouth 3 (three) times daily.   GUAIFENESIN (MUCINEX) 600 MG 12 HR TABLET    Take 600 mg by mouth 2 (two) times daily as needed for to loosen phlegm.   IBUPROFEN (ADVIL,MOTRIN) 800 MG TABLET    Take 1 tablet (800 mg total) by mouth every 6 (six) hours as needed.   LISINOPRIL (PRINIVIL,ZESTRIL) 2.5 MG TABLET    Take 2.5 mg by mouth daily.   METOPROLOL TARTRATE (LOPRESSOR) 25 MG TABLET    Take 25 mg by mouth 2 (two) times daily.   PANTOPRAZOLE (PROTONIX) 40 MG TABLET    Take 40 mg by mouth daily.    POLYETHYLENE GLYCOL (MIRALAX / GLYCOLAX) PACKET    Take 17 g by mouth 2 (two) times daily.   SULFADIAZINE 500 MG TABLET    Take 1,000 mg by mouth 2 (two) times daily.   TRAZODONE (DESYREL) 50 MG TABLET    Take 25 mg by mouth at bedtime.    VITAMIN B-12 100 MCG TABLET    Take 1 tablet (100 mcg total) by mouth daily.  Modified Medications   No medications on file  Discontinued Medications   GLIPIZIDE (GLUCOTROL) 5 MG TABLET    Take by mouth daily before breakfast.   METFORMIN (FORTAMET) 1000 MG (OSM) 24 HR TABLET    Take 1,000 mg by mouth 2 (two) times daily with a meal.   MIRTAZAPINE (REMERON) 7.5 MG TABLET    Take 7.5 mg by mouth at bedtime.     Physical Exam: Filed Vitals:   03/18/15 1315  BP: 100/51  Pulse: 84  Temp: 97.8 F (36.6 C)  TempSrc: Oral  Resp: 16  Weight: 123 lb 8 oz (56.019 kg)    Physical Exam  Constitutional: She is oriented to person, place, and time.  Frail elderly quadriplegic female.   HENT:  Head: Normocephalic and atraumatic.  Mouth/Throat: Oropharynx is clear and  moist.  Eyes: Pupils are equal, round, and reactive to light.  Neck: Neck supple.  Cardiovascular: Normal rate, regular rhythm and normal heart sounds.   Pulmonary/Chest: Effort normal and breath sounds normal. She has no wheezes.  Abdominal: Soft. Bowel sounds are normal. She exhibits no distension.  Musculoskeletal:  quadriplegic from multiple sclerosis  Lymphadenopathy:    She has no cervical adenopathy.  Neurological: She is alert and oriented to person, place, and time.  Skin: Skin is warm and dry. She is not  diaphoretic.  Psychiatric: She has a normal mood and affect.    Labs reviewed: Basic Metabolic Panel:  Recent Labs  09/17/14  NA 139  K 5.1  BUN 30*  CREATININE 0.5   Liver Function Tests: No results for input(s): AST, ALT, ALKPHOS, BILITOT, PROT, ALBUMIN in the last 8760 hours. No results for input(s): LIPASE, AMYLASE in the last 8760 hours. No results for input(s): AMMONIA in the last 8760 hours. CBC:  Recent Labs  09/17/14  WBC 7.1  HGB 12.7  HCT 39  PLT 300   TSH:  Recent Labs  09/17/14  TSH 0.56   A1C: Lab Results  Component Value Date   HGBA1C 6.9* 02/26/2015   Lipid Panel:  Recent Labs  09/17/14  CHOL 145  HDL 28*  LDLCALC 79  TRIG 190*     Assessment/Plan 1. DM (diabetes mellitus) type II controlled with renal manifestation High preprandial am blood sugars.  Will re-start Metformin 500mg  daily then in 2 weeks, increase to Metformin 500mg  bid to avoid GI upset.  Continue taking Tradjenta.  -to notify for increase in GI symptoms    2. Essential hypertension, benign Stable.  Blood pressures are within desirable range.  Continue lisinopril and metoprolol.  Will get BMP.    3. Benign paroxysmal positional vertigo, bilateral Stable. No complaints.   4. Headache Patient can take Tylenol as needed.  If continues, will consider further evaluation from neuro.     5. Trigeminal neuralgia pain Stable.  Controlled on gabapentin.    6.  Weight loss Stable, pt with 5 lb weight gain of in the last month.    Carlos American. Harle Battiest  [facility] [phone] 8 am - 5 pm) [phone] (after hours)

## 2015-03-19 LAB — BASIC METABOLIC PANEL
BUN: 18 mg/dL (ref 4–21)
Creatinine: 0.5 mg/dL (ref 0.5–1.1)
Glucose: 151 mg/dL
Potassium: 4.6 mmol/L (ref 3.4–5.3)
SODIUM: 144 mmol/L (ref 137–147)

## 2015-04-15 ENCOUNTER — Non-Acute Institutional Stay (SKILLED_NURSING_FACILITY): Payer: Medicare Other | Admitting: Internal Medicine

## 2015-04-15 DIAGNOSIS — I10 Essential (primary) hypertension: Secondary | ICD-10-CM | POA: Diagnosis not present

## 2015-04-15 DIAGNOSIS — E785 Hyperlipidemia, unspecified: Secondary | ICD-10-CM

## 2015-04-15 DIAGNOSIS — R519 Headache, unspecified: Secondary | ICD-10-CM

## 2015-04-15 DIAGNOSIS — R51 Headache: Secondary | ICD-10-CM

## 2015-04-15 DIAGNOSIS — E1129 Type 2 diabetes mellitus with other diabetic kidney complication: Secondary | ICD-10-CM | POA: Diagnosis not present

## 2015-04-15 NOTE — Progress Notes (Signed)
Patient ID: [name], female   DOB: [birth year], 80 y.o.   MRN: [id number]     Facility: [facility]   Chief Complaint  Patient presents with  . Medical Management of Chronic Issues    Medical Management of Chronic Issues   Allergies  Allergen Reactions  . Contrast Media [Iodinated Diagnostic Agents]     Unknown  . Penicillins     Unknown   Code status: full code  HPI 80 y.o. female patient is seen for routine visit. She complaints of intermittent dull occipital headache. Denies radiation, tylenol helps. Denies change of vision. Lying down brings it on. Breathing is stable. No change in behaviors or functional status reported by staff. Few low bp reading noted. She is concerned about being off lipitor. She is currently on lipitor. miralax has been helpful with her constipation. She has PMH of MS and quadriplegia, ulcerative colitis, constipation, HTN, DM2, trigeminal neuralgia, vertigo.  Review of Systems  Constitutional: Negative for fever, chills, diaphoresis.  HENT: Negative for hearing loss and sore throat.   Eyes: Negative for blurred vision, double vision and discharge.  Respiratory: Negative for shortness of breath and wheezing.   Cardiovascular: Negative for chest pain, palpitations, leg swelling.  Gastrointestinal: Negative for heartburn, nausea, vomiting, abdominal pain Genitourinary: Negative for dysuria Skin: Negative for itching and rash.  Neurological: Negative for tingling Psychiatric/Behavioral: Negative for depression and memory loss. The patient is not nervous/anxious.     Past Medical History  Diagnosis Date  . Hypertension   . Diabetes mellitus   . GERD (gastroesophageal reflux disease)   . Neuromuscular disorder   . Multiple sclerosis   . Anxiety   . Hyperlipemia   . Pneumonia   . Colitis   . Unable to ambulate   . GI bleed 09/07/2012  . Type II or unspecified type diabetes mellitus without mention of complication,  uncontrolled 02/03/2013  . Ulcerative colitis, unspecified 02/03/2013     Medication List       This list is accurate as of: 04/15/15  3:45 PM.  Always use your most recent med list.               acetaminophen 325 MG tablet  Commonly known as:  TYLENOL  Take 650 mg by mouth every 4 (four) hours as needed.     aspirin 81 MG tablet  Take 81 mg by mouth daily.     atorvastatin 20 MG tablet  Commonly known as:  LIPITOR  Take one tablet by mouth on Monday,Wednesday and Friday for cholesterol     cetirizine 10 MG tablet  Commonly known as:  ZYRTEC  Take one tablet by mouth every night at bedtime for allergic rhinitis     cyanocobalamin 100 MCG tablet  Take 1 tablet (100 mcg total) by mouth daily.     gabapentin 100 MG capsule  Commonly known as:  NEURONTIN  Take 3 capsules (300 mg total) by mouth 3 (three) times daily.     ibuprofen 800 MG tablet  Commonly known as:  ADVIL,MOTRIN  Take 1 tablet (800 mg total) by mouth every 6 (six) hours as needed.     linagliptin 5 MG Tabs tablet  Commonly known as:  TRADJENTA  Take 1 tablet (5 mg total) by mouth daily.     lisinopril 2.5 MG tablet  Commonly known as:  PRINIVIL,ZESTRIL  Take one tablet by mouth once daily for hypertension     metFORMIN 500 MG tablet  Commonly known as:  GLUCOPHAGE  Take 1 tablet (500 mg total) by mouth daily with breakfast.     metoprolol tartrate 25 MG tablet  Commonly known as:  LOPRESSOR  Take 1/2 tablet by mouth twice daily for blood pressure.     pantoprazole 40 MG tablet  Commonly known as:  PROTONIX  Take one tablet by mouth once daily for reflux     polyethylene glycol packet  Commonly known as:  MIRALAX / GLYCOLAX  Take 17 g by mouth 2 (two) times daily.     pseudoephedrine-guaifenesin 60-600 MG 12 hr tablet  Commonly known as:  MUCINEX D  Take one tablet by mouth twice daily as needed for congestion     sulfaDIAZINE 500 MG tablet  Take two tablets by mouth twice daily with H2O  for IBS     traZODone 50 MG tablet  Commonly known as:  DESYREL  Take 1/2 tablet by mouth every night at bedtime for insomnia     zinc oxide 11.3 % Crea cream  Commonly known as:  BALMEX  Apply cream topically to groin and buttock every shift as preventative care       Physical exam BP 98/67 mmHg  Pulse 70  Temp(Src) 98 F (36.7 C) (Oral)  Resp 18  Ht 5\' 4"  (1.626 m)  Wt 123 lb 12.8 oz (56.155 kg)  BMI 21.24 kg/m2  SpO2 94%  General- elderly frail female in no acute distress Head- atraumatic, normocephalic, some tenderness illicited on the occipital area Eyes- PERRLA, EOMI, no pallor, no icterus, no discharge Neck- no lymphadenopathy, no JVD Mouth- normal mucus membrane, has dentures Cardiovascular- normal s1,s2, no murmurs, normal distal pulses Respiratory- bilateral clear to auscultation, no wheeze, no rhonchi, no crackles Abdomen- bowel sounds present, soft, non tender Musculoskeletal- quadriplegia, restricted ROM at neck area, no cervical spine tenderness, contracture in her feet,  no leg edema Neurological- alert and oriented Skin- warm and dry Psychiatry- normal mood and affect  Labs Lab Results  Component Value Date   HGBA1C 6.9* 02/26/2015   CBC Latest Ref Rng 09/17/2014 03/13/2014 09/10/2013  WBC - 7.1 7.4 9.0  Hemoglobin 12.0 - 16.0 g/dL 12.7 12.6 13.0  Hematocrit 36 - 46 % 39 42 42  Platelets 150 - 399 K/L 300 257 298   CMP Latest Ref Rng 03/19/2015 09/17/2014 03/13/2014  Glucose 70 - 99 mg/dL - - -  BUN 4 - 21 mg/dL 18 30(A) 33(A)  Creatinine 0.5 - 1.1 mg/dL 0.5 0.5 0.7  Sodium 137 - 147 mmol/L 144 139 134(A)  Potassium 3.4 - 5.3 mmol/L 4.6 5.1 4.9  Chloride 96 - 112 mEq/L - - -  CO2 19 - 32 mEq/L - - -  Calcium 8.4 - 10.5 mg/dL - - -  Total Protein 6.0 - 8.3 g/dL - - -  Total Bilirubin 0.3 - 1.2 mg/dL - - -  Alkaline Phos 39 - 117 U/L - - -  AST 0 - 37 U/L - - -  ALT 0 - 35 U/L - - -   06/20/14 Urine microalbuminuria  positive   Assessment/plan  Occipital headache Likely positional given headache present in specific area, somewhat reporducible and with her quadriplegia, she is lying flat on her occiput for whole day. Will have therapy team evaluate for a gel base pillow and cervical pillow to see if this will provide relief.  Hyperlipidemia Lipid Panel     Component Value Date/Time   CHOL 145 09/17/2014   TRIG 190*  09/17/2014   HDL 28* 09/17/2014   LDLCALC 79 09/17/2014  continue lipitor 20 mg daily and check lipid panel  DM type 2 cbg 140-180 this month and 150-190 last month on review with one reading of 65. Continue metformin 500 mg daily and tradjenta 5 mg daily. Monitor cbg. Continue statin and lisinopril. Has microalbuminuria. With nausea it was changed, will keep metformin at 500 mg daily for now  HTN Low bp reading. Currently on lisinopril 2.5 mg daily with lopressor 12.5 mg bid. Change this to lopressor 12.5 mg daily for now. monitor bp and reassess. If bp < 110/60, d/c lopressor    Blanchie Serve, MD  [facility] [phone] (Monday-Friday 8 am - 5 pm) [phone] (afterhours)

## 2015-04-17 ENCOUNTER — Non-Acute Institutional Stay (SKILLED_NURSING_FACILITY): Payer: Medicare Other | Admitting: Internal Medicine

## 2015-04-17 DIAGNOSIS — K219 Gastro-esophageal reflux disease without esophagitis: Secondary | ICD-10-CM | POA: Diagnosis not present

## 2015-04-17 DIAGNOSIS — D509 Iron deficiency anemia, unspecified: Secondary | ICD-10-CM

## 2015-04-17 NOTE — Progress Notes (Signed)
Patient ID: [name], female   DOB: 07/19/1934, 80 y.o.   MRN: [id number]    Facility: [facility]   Chief Complaint  Patient presents with  . Acute Visit    drop in Hb   Allergies  Allergen Reactions  . Contrast Media [Iodinated Diagnostic Agents]     Unknown  . Penicillins     Unknown    Code status: full code  HPI 80 y.o. female patient is seen for acute visit with drop in hemoglobin noted in her blood work. She has PMH of MS and quadriplegia, ulcerative colitis, constipation, HTN, DM2, trigeminal neuralgia, vertigo. She denies any frank blood or melena in stool. Denies nausea, vomiting or epigastric pain.  Review of Systems  Constitutional: Negative for fever, chills  HENT: Negative for hearing loss and sore throat.   Eyes: Negative for blurred vision, double vision and discharge.  Respiratory: Negative for shortness of breath and wheezing.   Cardiovascular: Negative for chest pain, palpitations, leg swelling.  Genitourinary: Negative for dysuria or hematuria    Past Medical History  Diagnosis Date  . Hypertension   . Diabetes mellitus   . GERD (gastroesophageal reflux disease)   . Neuromuscular disorder ([place])   . Multiple sclerosis ([place])   . Anxiety   . Hyperlipemia   . Pneumonia   . Colitis   . Unable to ambulate   . GI bleed 09/07/2012  . Type II or unspecified type diabetes mellitus without mention of complication, uncontrolled 02/03/2013  . Ulcerative colitis, unspecified 02/03/2013   Medication reviewed. See [facility]  Physical exam 108/62, 78, 18, 94%  General- elderly frail female in no acute distress Head- atraumatic, normocephalic Eyes- PERRLA, EOMI, no pallor, no icterus, no discharge Neck- no lymphadenopathy, no JVD Mouth- normal mucus membrane, has dentures Cardiovascular- normal s1,s2, no murmurs, normal distal pulses Respiratory- bilateral clear to auscultation, no wheeze, no rhonchi, no crackles Abdomen- bowel sounds  present, soft, non tender Musculoskeletal- quadriplegia, restricted ROM at neck area, no cervical spine tenderness, contracture in her feet,  no leg edema Neurological- alert and oriented  Labs 04/16/15 wbc 7.6, hb 8.4, hct 32, mcv 68.7, plt 390   Assessment/plan  Microcytic anemia Drop in Hb with low mcv noted on labs. Currently on prn ibuprofen and baby aspirin which can contribute to it along with chronic diseases. Hemodynamically stable. D/c ibuprofen. Change aspirin to EC 81 mg daily. Check iron panel and erythropoetin level. Start ferrous sulfate 325 mg bid for now and check cbc in 4 weeks.also check guaiac stool to rule out ongoing bleed. Continue protonix 40 mg bid  gerd Stable on protonix 40 mg bid, changing aspirin to EC to prevent gastritis  Blanchie Serve, MD  [facility] [phone] (Monday-Friday 8 am - 5 pm) [phone] (afterhours)

## 2015-05-19 ENCOUNTER — Non-Acute Institutional Stay (SKILLED_NURSING_FACILITY): Payer: Medicare Other | Admitting: Internal Medicine

## 2015-05-19 DIAGNOSIS — D509 Iron deficiency anemia, unspecified: Secondary | ICD-10-CM

## 2015-05-19 DIAGNOSIS — K2970 Gastritis, unspecified, without bleeding: Secondary | ICD-10-CM

## 2015-05-19 DIAGNOSIS — K2990 Gastroduodenitis, unspecified, without bleeding: Secondary | ICD-10-CM

## 2015-05-19 NOTE — Progress Notes (Signed)
Patient ID: [name], female   DOB: [birth year], 80 y.o.   MRN: [id number]     Facility: [facility]   Chief Complaint  Patient presents with  . Acute Visit    drop in hemoglobin and stool positive for blood   Allergies  Allergen Reactions  . Contrast Media [Iodinated Diagnostic Agents]     Unknown  . Penicillins     Unknown   Code status: full code  HPI 80 y.o. female patient is seen for acute visit. She was noted to have a drop in her hemoglobin. On further workup she has low iron stores and is positive for guaiac stool. Staff have not noticed frank blood in stool and she has occasional straining with her constipation. She denies any nausea or vomiting. Denies any chest pain, new onset dyspnea or weakness. She has PMH of MS and quadriplegia, ulcerative colitis, constipation, HTN, DM2, trigeminal neuralgia, vertigo.  Review of Systems  Constitutional: Negative for fever, chills, diaphoresis.  Eyes: Negative for blurred vision, double vision and discharge.  Respiratory: Negative for shortness of breath and wheezing.   Cardiovascular: Negative for chest pain, palpitations, leg swelling.  Gastrointestinal: Negative for heartburn, nausea, vomiting, abdominal pain Genitourinary: Negative for dysuria Skin: Negative for itching and rash.  Neurological: Negative for tingling Psychiatric/Behavioral: Negative for depression and memory loss. The patient is not nervous/anxious.     Past Medical History  Diagnosis Date  . Hypertension   . Diabetes mellitus   . GERD (gastroesophageal reflux disease)   . Neuromuscular disorder   . Multiple sclerosis   . Anxiety   . Hyperlipemia   . Pneumonia   . Colitis   . Unable to ambulate   . GI bleed 09/07/2012  . Type II or unspecified type diabetes mellitus without mention of complication, uncontrolled 02/03/2013  . Ulcerative colitis, unspecified 02/03/2013     Medication List       This list is accurate as of:  05/19/15  4:48 PM.  Always use your most recent med list.               acetaminophen 325 MG tablet  Commonly known as:  TYLENOL  Take 650 mg by mouth every 4 (four) hours as needed.     aspirin 81 MG tablet  Take 81 mg by mouth daily.     atorvastatin 20 MG tablet  Commonly known as:  LIPITOR  Take one tablet by mouth on Monday,Wednesday and Friday for cholesterol     cetirizine 10 MG tablet  Commonly known as:  ZYRTEC  Take one tablet by mouth every night at bedtime for allergic rhinitis     cyanocobalamin 100 MCG tablet  Take 1 tablet (100 mcg total) by mouth daily.     gabapentin 100 MG capsule  Commonly known as:  NEURONTIN  Take 3 capsules (300 mg total) by mouth 3 (three) times daily.     linagliptin 5 MG Tabs tablet  Commonly known as:  TRADJENTA  Take 1 tablet (5 mg total) by mouth daily.     lisinopril 2.5 MG tablet  Commonly known as:  PRINIVIL,ZESTRIL  Take one tablet by mouth once daily for hypertension     metFORMIN 500 MG tablet  Commonly known as:  GLUCOPHAGE  Take 1 tablet (500 mg total) by mouth daily with breakfast.     metoprolol tartrate 25 MG tablet  Commonly known as:  LOPRESSOR  Take 1/2 tablet by mouth twice daily  for blood pressure.     pantoprazole 40 MG tablet  Commonly known as:  PROTONIX  Take one tablet by mouth once daily for reflux     polyethylene glycol packet  Commonly known as:  MIRALAX / GLYCOLAX  Take 17 g by mouth 2 (two) times daily.     pseudoephedrine-guaifenesin 60-600 MG 12 hr tablet  Commonly known as:  MUCINEX D  Take one tablet by mouth twice daily as needed for congestion     sulfaDIAZINE 500 MG tablet  Take two tablets by mouth twice daily with H2O for IBS     traZODone 50 MG tablet  Commonly known as:  DESYREL  Take 1/2 tablet by mouth every night at bedtime for insomnia     zinc oxide 11.3 % Crea cream  Commonly known as:  BALMEX  Apply cream topically to groin and buttock every shift as preventative  care       Physical exam BP 132/63 mmHg  Pulse 85  Temp(Src) 97 F (36.1 C)  Resp 18  SpO2 91%  General- elderly frail female in no acute distress Head- atraumatic, normocephalic, some tenderness illicited on the occipital area Eyes- PERRLA, EOMI, no pallor, no icterus, no discharge Neck- no lymphadenopathy, no JVD Mouth- normal mucus membrane, has dentures Cardiovascular- normal s1,s2, no murmurs, normal distal pulses Respiratory- bilateral clear to auscultation, no wheeze, no rhonchi, no crackles Abdomen- bowel sounds present, soft, non tender Musculoskeletal- quadriplegia, restricted ROM at neck area, no cervical spine tenderness, contracture in her feet,  no leg edema Neurological- alert and oriented Skin- warm and dry Psychiatry- normal mood and affect  Labs 05/18/15 hb 8, hct 30.4, mcv 68.9, plt 444 04/16/15 hb 8.4, hct 32   Assessment/plan  Iron deficiency anemia Low iron stores and drop in Hb. Patient does not want to see GI or hematology at present. Currently hemodynamically stable. Change ferrous sulfate to 325 mg po tid for now. Avoid NSAIDS. Check cbc on 05/25/15 to r/o further drop in Hb  Gi bleed Thought to be from upper gi bleed from possible gastritis given her being on NSAIDs. Off ibuprofen at present. Discontinue aspirin for now. Change pantoprazole to 40 mg bid and start iron supplement as above. Has hx of IBD and is on sulfasalazine. If no improvement in Hb or has further drop on lab work next week, will need gi consult and patient agrees upon this. Monitor vital signs daily for now  [facility], MD  [facility] [phone] (Monday-Friday 8 am - 5 pm) [phone] (afterhours)

## 2015-05-25 LAB — BASIC METABOLIC PANEL
BUN: 15 mg/dL (ref 4–21)
Creatinine: 0.4 mg/dL — AB (ref 0.5–1.1)
Glucose: 122 mg/dL
Potassium: 4.3 mmol/L (ref 3.4–5.3)
SODIUM: 141 mmol/L (ref 137–147)

## 2015-05-25 LAB — CBC AND DIFFERENTIAL
HEMATOCRIT: 31 % — AB (ref 36–46)
Hemoglobin: 8.4 g/dL — AB (ref 12.0–16.0)
PLATELETS: 468 10*3/uL — AB (ref 150–399)
WBC: 6.4 10^3/mL

## 2015-06-01 ENCOUNTER — Non-Acute Institutional Stay (SKILLED_NURSING_FACILITY): Payer: Medicare Other | Admitting: Nurse Practitioner

## 2015-06-01 ENCOUNTER — Encounter: Payer: Self-pay | Admitting: Nurse Practitioner

## 2015-06-01 DIAGNOSIS — K2990 Gastroduodenitis, unspecified, without bleeding: Secondary | ICD-10-CM | POA: Diagnosis not present

## 2015-06-01 DIAGNOSIS — D509 Iron deficiency anemia, unspecified: Secondary | ICD-10-CM | POA: Diagnosis not present

## 2015-06-01 DIAGNOSIS — K2970 Gastritis, unspecified, without bleeding: Secondary | ICD-10-CM | POA: Diagnosis not present

## 2015-06-01 DIAGNOSIS — K5900 Constipation, unspecified: Secondary | ICD-10-CM

## 2015-06-01 DIAGNOSIS — E1129 Type 2 diabetes mellitus with other diabetic kidney complication: Secondary | ICD-10-CM

## 2015-06-01 DIAGNOSIS — G500 Trigeminal neuralgia: Secondary | ICD-10-CM

## 2015-06-01 DIAGNOSIS — J988 Other specified respiratory disorders: Secondary | ICD-10-CM

## 2015-06-01 NOTE — Progress Notes (Signed)
Patient ID: RIFKY KASTOR, female   DOB: 02/13/1935, 80 y.o.   MRN: [id number]    Nursing Home Location:  [place] of Service: SNF (31)  PCP: Blanchie Serve, MD  Allergies  Allergen Reactions  . Contrast Media [Iodinated Diagnostic Agents]     Unknown  . Penicillins     Unknown    Chief Complaint  Patient presents with  . Medical Management of Chronic Issues    Routine Visit     HPI:  Patient is a 80 y.o. female seen today at [facility] for routine follow up on chronic conditions. PMH of MS and quadriplegia, ulcerative colitis, constipation, HTN, DM2, trigeminal neuralgia, vertigo. In the last month pt has been seen due to drop in hgb and positive guaiac stools. Protonix was increase to BID and iron increased to TID. hgb has remained stable at 8.4. Pt denies dizziness, nausea, vomiting, blood in stool, chest pains, dyspnea or worsening fatigue. Blood sugars have been ranging from 123-175. Pt reports increase in mucous in the evening. Reports she was previously on medication that has now been stopped and would like something in the evening for this. Staff without concerns at this time.  Review of Systems:  Review of Systems  Constitutional: Negative for chills, diaphoresis and fatigue.  HENT: Positive for congestion.   Respiratory: Negative for cough, shortness of breath and wheezing.   Cardiovascular: Negative for chest pain, palpitations and leg swelling.  Gastrointestinal: Negative for abdominal pain, diarrhea and constipation.  Genitourinary: Negative.  Negative for dysuria and difficulty urinating.  Musculoskeletal: Negative for myalgias, joint swelling and arthralgias.  Skin: Negative.   Neurological: Positive for headaches (ongoing). Negative for dizziness, weakness, light-headedness and numbness.  Psychiatric/Behavioral: Negative.     Past Medical History  Diagnosis Date  . Hypertension   . Diabetes mellitus   . GERD  (gastroesophageal reflux disease)   . Neuromuscular disorder ([place])   . Multiple sclerosis ([place])   . Anxiety   . Hyperlipemia   . Pneumonia   . Colitis   . Unable to ambulate   . GI bleed 09/07/2012  . Type II or unspecified type diabetes mellitus without mention of complication, uncontrolled 02/03/2013  . Ulcerative colitis, unspecified 02/03/2013   Past Surgical History  Procedure Laterality Date  . Tubal ligation    . Flexible sigmoidoscopy  10/05/2011    Procedure: FLEXIBLE SIGMOIDOSCOPY;  Surgeon: Winfield Cunas., MD;  Location: [facility];  Service: Endoscopy;  Laterality: N/A;   pt. coming from [place] daughter phone no. [phone] might come with her  sedation is needed, pt. is paraplegic  . Esophagogastroduodenoscopy N/A 09/09/2012    Procedure: ESOPHAGOGASTRODUODENOSCOPY (EGD);  Surgeon: Winfield Cunas., MD;  Location: [facility];  Service: Endoscopy;  Laterality: N/A;  . Colonoscopy N/A 09/09/2012    Procedure: COLONOSCOPY;  Surgeon: Winfield Cunas., MD;  Location: [facility] ENDOSCOPY;  Service: Endoscopy;  Laterality: N/A;  possible flex sig   Social History:   reports that she quit smoking about 19 years ago. She has never used smokeless tobacco. She reports that she does not drink alcohol or use illicit drugs.  Family History  Problem Relation Age of Onset  . CAD Mother   . Diabetes Daughter   . Diabetes Daughter   . Diabetes Son     Medications: Patient's Medications  New Prescriptions   No medications on file  Previous Medications   ACETAMINOPHEN (  TYLENOL) 325 MG TABLET    Take 650 mg by mouth every 4 (four) hours as needed.   CETIRIZINE (ZYRTEC) 10 MG TABLET    Take one tablet by mouth every night at bedtime for allergic rhinitis   FERROUS SULFATE 325 (65 FE) MG EC TABLET    Take 325 mg by mouth 3 (three) times daily with meals.   LINAGLIPTIN (TRADJENTA) 5 MG TABS TABLET    Take 1 tablet (5 mg total) by mouth daily.    LISINOPRIL (PRINIVIL,ZESTRIL) 2.5 MG TABLET    Take one tablet by mouth once daily for hypertension   METFORMIN (GLUCOPHAGE) 500 MG TABLET    Take 1 tablet (500 mg total) by mouth daily with breakfast.   METOPROLOL TARTRATE (LOPRESSOR) 25 MG TABLET    Take 1/2 tablet by mouth daily for blood pressure. HOLD for SBP <110   PANTOPRAZOLE (PROTONIX) 40 MG TABLET    Take one tablet by mouth twice daily for reflux   SULFADIAZINE 500 MG TABLET    Take two tablets by mouth twice daily with H2O for IBS   TRAZODONE (DESYREL) 50 MG TABLET    Take 1/2 tablet by mouth every night at bedtime for insomnia   VITAMIN B-12 100 MCG TABLET    Take 1 tablet (100 mcg total) by mouth daily.   ZINC OXIDE (BALMEX) 11.3 % CREA CREAM    Apply cream topically to groin and buttock every shift as preventative care  Modified Medications   Modified Medication Previous Medication   GABAPENTIN (NEURONTIN) 100 MG CAPSULE gabapentin (NEURONTIN) 100 MG capsule      1 capsule by mouth three times daily for MS    Take 3 capsules (300 mg total) by mouth 3 (three) times daily.   POLYETHYLENE GLYCOL (MIRALAX / GLYCOLAX) PACKET polyethylene glycol (MIRALAX / GLYCOLAX) packet      Mix powder in 4-8 oz of liquid once daily for constipation    Take 17 g by mouth 2 (two) times daily.  Discontinued Medications   ASPIRIN 81 MG TABLET    Take 81 mg by mouth daily.   ATORVASTATIN (LIPITOR) 20 MG TABLET    Take one tablet by mouth on Monday,Wednesday and Friday for cholesterol   PSEUDOEPHEDRINE-GUAIFENESIN (MUCINEX D) 60-600 MG 12 HR TABLET    Take one tablet by mouth twice daily as needed for congestion     Physical Exam: Filed Vitals:   06/01/15 1442  BP: 107/58  Pulse: 88  Temp: 97.3 F (36.3 C)  TempSrc: Oral  Resp: 16  Height: 5\' 4"  (1.626 m)  Weight: 124 lb 8 oz (56.473 kg)  SpO2: 97%    Physical Exam  Constitutional: She is oriented to person, place, and time.  Frail elderly quadriplegic female.   HENT:  Head:  Normocephalic and atraumatic.  Mouth/Throat: Oropharynx is clear and moist.  Eyes: Pupils are equal, round, and reactive to light.  Neck: Neck supple.  Cardiovascular: Normal rate, regular rhythm and normal heart sounds.   Pulmonary/Chest: Effort normal and breath sounds normal. She has no wheezes.  Abdominal: Soft. Bowel sounds are normal. She exhibits no distension.  Musculoskeletal:  quadriplegic from multiple sclerosis  Lymphadenopathy:    She has no cervical adenopathy.  Neurological: She is alert and oriented to person, place, and time.  Skin: Skin is warm and dry. She is not diaphoretic.  Psychiatric: She has a normal mood and affect.    Labs reviewed: Basic Metabolic Panel:  Recent Labs  09/17/14  03/19/15 05/25/15  NA 139 144 141  K 5.1 4.6 4.3  BUN 30* 18 15  CREATININE 0.5 0.5 0.4*   Liver Function Tests: No results for input(s): AST, ALT, ALKPHOS, BILITOT, PROT, ALBUMIN in the last 8760 hours. No results for input(s): LIPASE, AMYLASE in the last 8760 hours. No results for input(s): AMMONIA in the last 8760 hours. CBC:  Recent Labs  09/17/14 05/25/15  WBC 7.1 6.4  HGB 12.7 8.4*  HCT 39 31*  PLT 300 468*   TSH:  Recent Labs  09/17/14  TSH 0.56   A1C: Lab Results  Component Value Date   HGBA1C 6.9* 02/26/2015   Lipid Panel:  Recent Labs  09/17/14  CHOL 145  HDL 28*  LDLCALC 79  TRIG 190*     Assessment/Plan 1. Constipation, unspecified constipation type -controlled on current regimen  - polyethylene glycol (MIRALAX / GLYCOLAX) packet; Mix powder in 4-8 oz of liquid once daily for constipation  2. Trigeminal neuralgia pain -stable, conts on gabapentin - gabapentin (NEURONTIN) 100 MG capsule; 1 capsule by mouth three times daily for MS  3. Anemia, iron deficiency With hem positive stools, iron increased to TID last month and most recent hgb stable. Will follow up CBC in 2 weeks to follow. Cont iron TID with meals  4. Controlled type 2  diabetes mellitus with other diabetic kidney complication, without long-term current use of insulin (HCC) Blood sugars stable, conts on metformin and tradjenta   5. Gastritis and gastroduodenitis Avoid NSAID, off ASA, conts on protonix BID  6. Congestion of respiratory tract Chronic, previously on mucinex but now not receiving. Will restart mucinex in the evening as she reports increase congestion around bedtime.    Carlos American. Harle Battiest  [facility] [phone] 8 am - 5 pm) [phone] (after hours)

## 2015-07-07 ENCOUNTER — Inpatient Hospital Stay (HOSPITAL_COMMUNITY)
Admission: EM | Admit: 2015-07-07 | Discharge: 2015-07-11 | DRG: 871 | Disposition: A | Discharge status: Skilled Nursing Facility | Payer: Medicare Other | Attending: Internal Medicine | Admitting: Internal Medicine

## 2015-07-07 ENCOUNTER — Encounter (HOSPITAL_COMMUNITY): Payer: Self-pay | Admitting: Emergency Medicine

## 2015-07-07 ENCOUNTER — Emergency Department (HOSPITAL_COMMUNITY): Payer: Medicare Other

## 2015-07-07 ENCOUNTER — Non-Acute Institutional Stay (SKILLED_NURSING_FACILITY): Payer: Medicare Other | Admitting: Internal Medicine

## 2015-07-07 DIAGNOSIS — J690 Pneumonitis due to inhalation of food and vomit: Secondary | ICD-10-CM | POA: Diagnosis present

## 2015-07-07 DIAGNOSIS — G8250 Quadriplegia, unspecified: Secondary | ICD-10-CM | POA: Diagnosis present

## 2015-07-07 DIAGNOSIS — I248 Other forms of acute ischemic heart disease: Secondary | ICD-10-CM | POA: Diagnosis present

## 2015-07-07 DIAGNOSIS — I10 Essential (primary) hypertension: Secondary | ICD-10-CM | POA: Diagnosis not present

## 2015-07-07 DIAGNOSIS — R7989 Other specified abnormal findings of blood chemistry: Secondary | ICD-10-CM | POA: Diagnosis not present

## 2015-07-07 DIAGNOSIS — K5190 Ulcerative colitis, unspecified, without complications: Secondary | ICD-10-CM | POA: Diagnosis present

## 2015-07-07 DIAGNOSIS — R000 Tachycardia, unspecified: Secondary | ICD-10-CM | POA: Diagnosis not present

## 2015-07-07 DIAGNOSIS — K51911 Ulcerative colitis, unspecified with rectal bleeding: Secondary | ICD-10-CM | POA: Diagnosis present

## 2015-07-07 DIAGNOSIS — Z87891 Personal history of nicotine dependence: Secondary | ICD-10-CM

## 2015-07-07 DIAGNOSIS — B9620 Unspecified Escherichia coli [E. coli] as the cause of diseases classified elsewhere: Secondary | ICD-10-CM | POA: Diagnosis present

## 2015-07-07 DIAGNOSIS — I110 Hypertensive heart disease with heart failure: Secondary | ICD-10-CM | POA: Diagnosis present

## 2015-07-07 DIAGNOSIS — R404 Transient alteration of awareness: Secondary | ICD-10-CM

## 2015-07-07 DIAGNOSIS — R0682 Tachypnea, not elsewhere classified: Secondary | ICD-10-CM

## 2015-07-07 DIAGNOSIS — I214 Non-ST elevation (NSTEMI) myocardial infarction: Secondary | ICD-10-CM | POA: Diagnosis not present

## 2015-07-07 DIAGNOSIS — G35 Multiple sclerosis: Secondary | ICD-10-CM | POA: Diagnosis present

## 2015-07-07 DIAGNOSIS — K589 Irritable bowel syndrome without diarrhea: Secondary | ICD-10-CM | POA: Diagnosis present

## 2015-07-07 DIAGNOSIS — R0902 Hypoxemia: Secondary | ICD-10-CM

## 2015-07-07 DIAGNOSIS — K219 Gastro-esophageal reflux disease without esophagitis: Secondary | ICD-10-CM | POA: Diagnosis present

## 2015-07-07 DIAGNOSIS — Z7982 Long term (current) use of aspirin: Secondary | ICD-10-CM | POA: Diagnosis not present

## 2015-07-07 DIAGNOSIS — R0602 Shortness of breath: Secondary | ICD-10-CM | POA: Diagnosis present

## 2015-07-07 DIAGNOSIS — E119 Type 2 diabetes mellitus without complications: Secondary | ICD-10-CM | POA: Diagnosis present

## 2015-07-07 DIAGNOSIS — Z79899 Other long term (current) drug therapy: Secondary | ICD-10-CM | POA: Diagnosis not present

## 2015-07-07 DIAGNOSIS — F411 Generalized anxiety disorder: Secondary | ICD-10-CM | POA: Diagnosis present

## 2015-07-07 DIAGNOSIS — I5032 Chronic diastolic (congestive) heart failure: Secondary | ICD-10-CM | POA: Diagnosis not present

## 2015-07-07 DIAGNOSIS — Z66 Do not resuscitate: Secondary | ICD-10-CM | POA: Diagnosis not present

## 2015-07-07 DIAGNOSIS — N390 Urinary tract infection, site not specified: Secondary | ICD-10-CM | POA: Diagnosis present

## 2015-07-07 DIAGNOSIS — I639 Cerebral infarction, unspecified: Secondary | ICD-10-CM

## 2015-07-07 DIAGNOSIS — E8729 Other acidosis: Secondary | ICD-10-CM

## 2015-07-07 DIAGNOSIS — J189 Pneumonia, unspecified organism: Secondary | ICD-10-CM | POA: Diagnosis present

## 2015-07-07 DIAGNOSIS — A419 Sepsis, unspecified organism: Secondary | ICD-10-CM | POA: Diagnosis present

## 2015-07-07 DIAGNOSIS — Z7984 Long term (current) use of oral hypoglycemic drugs: Secondary | ICD-10-CM | POA: Diagnosis not present

## 2015-07-07 DIAGNOSIS — J9602 Acute respiratory failure with hypercapnia: Secondary | ICD-10-CM | POA: Diagnosis not present

## 2015-07-07 DIAGNOSIS — R4689 Other symptoms and signs involving appearance and behavior: Secondary | ICD-10-CM

## 2015-07-07 DIAGNOSIS — I959 Hypotension, unspecified: Secondary | ICD-10-CM | POA: Diagnosis not present

## 2015-07-07 DIAGNOSIS — F419 Anxiety disorder, unspecified: Secondary | ICD-10-CM | POA: Diagnosis present

## 2015-07-07 DIAGNOSIS — E785 Hyperlipidemia, unspecified: Secondary | ICD-10-CM | POA: Diagnosis present

## 2015-07-07 DIAGNOSIS — I213 ST elevation (STEMI) myocardial infarction of unspecified site: Secondary | ICD-10-CM | POA: Diagnosis not present

## 2015-07-07 DIAGNOSIS — D509 Iron deficiency anemia, unspecified: Secondary | ICD-10-CM | POA: Diagnosis present

## 2015-07-07 DIAGNOSIS — K5900 Constipation, unspecified: Secondary | ICD-10-CM

## 2015-07-07 DIAGNOSIS — Y95 Nosocomial condition: Secondary | ICD-10-CM | POA: Diagnosis present

## 2015-07-07 DIAGNOSIS — E872 Acidosis: Secondary | ICD-10-CM | POA: Diagnosis present

## 2015-07-07 DIAGNOSIS — J9601 Acute respiratory failure with hypoxia: Secondary | ICD-10-CM

## 2015-07-07 HISTORY — DX: Chronic diastolic (congestive) heart failure: I50.32

## 2015-07-07 LAB — URINALYSIS, ROUTINE W REFLEX MICROSCOPIC
GLUCOSE, UA: NEGATIVE mg/dL
HGB URINE DIPSTICK: NEGATIVE
Ketones, ur: 15 mg/dL — AB
Nitrite: POSITIVE — AB
Protein, ur: NEGATIVE mg/dL
SPECIFIC GRAVITY, URINE: 1.026 (ref 1.005–1.030)
pH: 5 (ref 5.0–8.0)

## 2015-07-07 LAB — COMPREHENSIVE METABOLIC PANEL
ALK PHOS: 63 U/L (ref 38–126)
ALT: 11 U/L — AB (ref 14–54)
AST: 23 U/L (ref 15–41)
Albumin: 3.5 g/dL (ref 3.5–5.0)
Anion gap: 9 (ref 5–15)
BILIRUBIN TOTAL: 0.1 mg/dL — AB (ref 0.3–1.2)
BUN: 12 mg/dL (ref 6–20)
CALCIUM: 10 mg/dL (ref 8.9–10.3)
CO2: 31 mmol/L (ref 22–32)
CREATININE: 0.46 mg/dL (ref 0.44–1.00)
Chloride: 105 mmol/L (ref 101–111)
Glucose, Bld: 154 mg/dL — ABNORMAL HIGH (ref 65–99)
Potassium: 4.7 mmol/L (ref 3.5–5.1)
SODIUM: 145 mmol/L (ref 135–145)
TOTAL PROTEIN: 7.6 g/dL (ref 6.5–8.1)

## 2015-07-07 LAB — CBC WITH DIFFERENTIAL/PLATELET
BASOS ABS: 0 10*3/uL (ref 0.0–0.1)
BASOS PCT: 0 %
EOS ABS: 0.1 10*3/uL (ref 0.0–0.7)
Eosinophils Relative: 1 %
HCT: 49.5 % — ABNORMAL HIGH (ref 36.0–46.0)
Hemoglobin: 14.1 g/dL (ref 12.0–15.0)
Lymphocytes Relative: 30 %
Lymphs Abs: 2.6 10*3/uL (ref 0.7–4.0)
MCH: 23.7 pg — AB (ref 26.0–34.0)
MCHC: 28.5 g/dL — AB (ref 30.0–36.0)
MCV: 83.1 fL (ref 78.0–100.0)
MONO ABS: 0.8 10*3/uL (ref 0.1–1.0)
Monocytes Relative: 9 %
NEUTROS PCT: 60 %
Neutro Abs: 5.2 10*3/uL (ref 1.7–7.7)
PLATELETS: ADEQUATE 10*3/uL (ref 150–400)
RBC: 5.96 MIL/uL — ABNORMAL HIGH (ref 3.87–5.11)
RDW: 28.6 % — ABNORMAL HIGH (ref 11.5–15.5)
WBC: 8.7 10*3/uL (ref 4.0–10.5)

## 2015-07-07 LAB — I-STAT ARTERIAL BLOOD GAS, ED
Acid-Base Excess: 4 mmol/L — ABNORMAL HIGH (ref 0.0–2.0)
BICARBONATE: 32.5 meq/L — AB (ref 20.0–24.0)
O2 SAT: 83 %
PCO2 ART: 68.9 mmHg — AB (ref 35.0–45.0)
PH ART: 7.282 — AB (ref 7.350–7.450)
TCO2: 35 mmol/L (ref 0–100)
pO2, Arterial: 56 mmHg — ABNORMAL LOW (ref 80.0–100.0)

## 2015-07-07 LAB — I-STAT TROPONIN, ED: TROPONIN I, POC: 0.31 ng/mL — AB (ref 0.00–0.08)

## 2015-07-07 LAB — I-STAT CG4 LACTIC ACID, ED: Lactic Acid, Venous: 1.8 mmol/L (ref 0.5–2.0)

## 2015-07-07 LAB — BRAIN NATRIURETIC PEPTIDE: B Natriuretic Peptide: 36.9 pg/mL (ref 0.0–100.0)

## 2015-07-07 LAB — URINE MICROSCOPIC-ADD ON

## 2015-07-07 LAB — D-DIMER, QUANTITATIVE: D-Dimer, Quant: 1.29 ug/mL-FEU — ABNORMAL HIGH (ref 0.00–0.50)

## 2015-07-07 MED ORDER — LEVOFLOXACIN IN D5W 750 MG/150ML IV SOLN
750.0000 mg | Freq: Once | INTRAVENOUS | Status: AC
  Administered 2015-07-07: 750 mg via INTRAVENOUS
  Filled 2015-07-07: qty 150

## 2015-07-07 MED ORDER — ACETAMINOPHEN 325 MG PO TABS
650.0000 mg | ORAL_TABLET | Freq: Four times a day (QID) | ORAL | Status: DC | PRN
  Administered 2015-07-08: 650 mg via ORAL
  Filled 2015-07-07: qty 2

## 2015-07-07 MED ORDER — PIPERACILLIN-TAZOBACTAM 3.375 G IVPB 30 MIN
3.3750 g | Freq: Once | INTRAVENOUS | Status: AC
  Administered 2015-07-07: 3.375 g via INTRAVENOUS
  Filled 2015-07-07: qty 50

## 2015-07-07 MED ORDER — HYDRALAZINE HCL 20 MG/ML IJ SOLN: 5.0000 mg | INTRAMUSCULAR | Status: DC | PRN

## 2015-07-07 MED ORDER — VANCOMYCIN HCL IN DEXTROSE 1-5 GM/200ML-% IV SOLN
1000.0000 mg | Freq: Once | INTRAVENOUS | Status: AC
  Administered 2015-07-08: 1000 mg via INTRAVENOUS
  Filled 2015-07-07: qty 200

## 2015-07-07 MED ORDER — SODIUM CHLORIDE 0.9 % IV BOLUS (SEPSIS)
1000.0000 mL | INTRAVENOUS | Status: DC
  Administered 2015-07-07: 1000 mL via INTRAVENOUS

## 2015-07-07 MED ORDER — VITAMIN B-12 100 MCG PO TABS
100.0000 ug | ORAL_TABLET | Freq: Every day | ORAL | Status: DC
  Administered 2015-07-08 – 2015-07-11 (×5): 100 ug via ORAL
  Filled 2015-07-07 (×6): qty 1

## 2015-07-07 MED ORDER — SULFADIAZINE 500 MG PO TABS
1000.0000 mg | ORAL_TABLET | Freq: Two times a day (BID) | ORAL | Status: DC
  Administered 2015-07-07 – 2015-07-11 (×7): 1000 mg via ORAL
  Filled 2015-07-07 (×10): qty 2

## 2015-07-07 MED ORDER — ZINC OXIDE 11.3 % EX CREA
TOPICAL_CREAM | Freq: Every day | CUTANEOUS | Status: DC
  Administered 2015-07-07 – 2015-07-08 (×2): via TOPICAL
  Administered 2015-07-09: 1 via TOPICAL
  Administered 2015-07-10 – 2015-07-11 (×2): via TOPICAL
  Filled 2015-07-07: qty 56

## 2015-07-07 MED ORDER — SODIUM CHLORIDE 0.9 % IV BOLUS (SEPSIS)
1000.0000 mL | Freq: Once | INTRAVENOUS | Status: AC
  Administered 2015-07-07: 1000 mL via INTRAVENOUS

## 2015-07-07 MED ORDER — HEPARIN SODIUM (PORCINE) 5000 UNIT/ML IJ SOLN
5000.0000 [IU] | Freq: Three times a day (TID) | INTRAMUSCULAR | Status: DC
  Administered 2015-07-07: 5000 [IU] via SUBCUTANEOUS
  Filled 2015-07-07: qty 1

## 2015-07-07 MED ORDER — LORATADINE 10 MG PO TABS
10.0000 mg | ORAL_TABLET | Freq: Every day | ORAL | Status: DC
  Administered 2015-07-07 – 2015-07-11 (×5): 10 mg via ORAL
  Filled 2015-07-07 (×5): qty 1

## 2015-07-07 MED ORDER — LORAZEPAM 2 MG/ML IJ SOLN: 0.5000 mg | Freq: Once | INTRAMUSCULAR | Status: DC

## 2015-07-07 MED ORDER — PANTOPRAZOLE SODIUM 40 MG IV SOLR
40.0000 mg | INTRAVENOUS | Status: DC
  Administered 2015-07-07 – 2015-07-08 (×2): 40 mg via INTRAVENOUS
  Filled 2015-07-07 (×2): qty 40

## 2015-07-07 MED ORDER — METOPROLOL TARTRATE 12.5 MG HALF TABLET
12.5000 mg | ORAL_TABLET | Freq: Every day | ORAL | Status: DC
  Administered 2015-07-08 – 2015-07-11 (×4): 12.5 mg via ORAL
  Filled 2015-07-07 (×4): qty 1

## 2015-07-07 MED ORDER — POLYETHYLENE GLYCOL 3350 17 G PO PACK: 17.0000 g | PACK | Freq: Every day | ORAL | Status: DC | PRN

## 2015-07-07 MED ORDER — GUAIFENESIN ER 600 MG PO TB12
600.0000 mg | ORAL_TABLET | Freq: Two times a day (BID) | ORAL | Status: DC
  Administered 2015-07-07 – 2015-07-11 (×7): 600 mg via ORAL
  Filled 2015-07-07 (×7): qty 1

## 2015-07-07 MED ORDER — INSULIN ASPART 100 UNIT/ML ~~LOC~~ SOLN: 0.0000 [IU] | Freq: Three times a day (TID) | SUBCUTANEOUS | Status: DC

## 2015-07-07 MED ORDER — LORAZEPAM 2 MG/ML IJ SOLN
INTRAMUSCULAR | Status: AC
  Filled 2015-07-07: qty 1

## 2015-07-07 MED ORDER — LEVALBUTEROL HCL 1.25 MG/0.5ML IN NEBU: 1.2500 mg | INHALATION_SOLUTION | Freq: Four times a day (QID) | RESPIRATORY_TRACT | Status: DC

## 2015-07-07 MED ORDER — SODIUM CHLORIDE 0.9 % IV SOLN
INTRAVENOUS | Status: DC
  Administered 2015-07-07: 23:00:00 via INTRAVENOUS

## 2015-07-07 MED ORDER — SODIUM CHLORIDE 0.9 % IV BOLUS (SEPSIS)
500.0000 mL | Freq: Once | INTRAVENOUS | Status: AC
  Administered 2015-07-07: 500 mL via INTRAVENOUS

## 2015-07-07 MED ORDER — GABAPENTIN 100 MG PO CAPS
100.0000 mg | ORAL_CAPSULE | Freq: Three times a day (TID) | ORAL | Status: DC
  Administered 2015-07-07 – 2015-07-08 (×3): 100 mg via ORAL
  Filled 2015-07-07 (×3): qty 1

## 2015-07-07 MED ORDER — TRAZODONE HCL 50 MG PO TABS: 25.0000 mg | ORAL_TABLET | Freq: Every evening | ORAL | Status: DC | PRN

## 2015-07-07 MED ORDER — LEVALBUTEROL HCL 1.25 MG/0.5ML IN NEBU
1.2500 mg | INHALATION_SOLUTION | Freq: Four times a day (QID) | RESPIRATORY_TRACT | Status: DC
  Administered 2015-07-08 – 2015-07-09 (×7): 1.25 mg via RESPIRATORY_TRACT
  Filled 2015-07-07 (×7): qty 0.5

## 2015-07-07 MED ORDER — FERROUS SULFATE 325 (65 FE) MG PO TBEC: 325.0000 mg | DELAYED_RELEASE_TABLET | Freq: Three times a day (TID) | ORAL | Status: DC

## 2015-07-07 NOTE — ED Notes (Signed)
X-ray at bedside

## 2015-07-07 NOTE — ED Notes (Signed)
Pt sent from [facility]. Ems- pt last seen normal at 1400. Staff rounded at 1530 and pt was noticed to have right sided facial droop and seemed lethergic. Pt bp was noted to be in 80s on arrival HR 140's. pts o2 on arival to ED 83% on room air. NRB applied. Pt is alert and ox4.

## 2015-07-07 NOTE — Progress Notes (Signed)
Patient ID: [name], female   DOB: [birth year], 80 y.o.   MRN: [id number]     Facility: [facility]   Chief Complaint  Patient presents with  . Acute Visit    hypotension, tachycardia, pale   Allergies  Allergen Reactions  . Contrast Media [Iodinated Diagnostic Agents]     Unknown  . Penicillins     Unknown   Code status: full code  HPI 80 y/o female patient is seen for acute visit. She was noted by CNA to be pale this afternoon and checked her vitals which were reviewed as below. She had been doing fine until this afternoon. She was complaining of some nausea this am and was given 1 dose of 12.5 mg phenergan po. In room, patient is noted to have right facial droop and appears somewhat sleepy. She responds to name call by opening her eyes and is able to answer questions but her speech is slurred from this am. She has PMH of MS and quadriplegia, ulcerative colitis, constipation, HTN, DM2, trigeminal neuralgia, vertigo.  Review of Systems  Constitutional: Negative for fever. HENT: Negative for sore throat.   Eyes: Negative for blurred vision, double vision and discharge.  Respiratory: Negative for shortness of breath and wheezing.   Cardiovascular: Negative for chest pain, palpitations.  Gastrointestinal: Negative for heartburn, nausea, vomiting, abdominal pain. Genitourinary: Negative for dysuria. Neurological: Negative for tingling    Past Medical History  Diagnosis Date  . Hypertension   . Diabetes mellitus   . GERD (gastroesophageal reflux disease)   . Neuromuscular disorder ([place])   . Multiple sclerosis ([place])   . Anxiety   . Hyperlipemia   . Pneumonia   . Colitis   . Unable to ambulate   . GI bleed 09/07/2012  . Type II or unspecified type diabetes mellitus without mention of complication, uncontrolled 02/03/2013  . Ulcerative colitis, unspecified 02/03/2013    Medication reviewed. See [facility]   Physical exam BP 85/54 mmHg  Pulse 132   Temp(Src) 97.1 F (36.2 C)  Resp 20  SpO2 70%  General- elderly frail female,  in no acute distress Head- atraumatic, normocephalic Eyes- dilated pupils reactive to light, no pallor, no icterus, no discharge Neck- no lymphadenopathy, no JVD Mouth- normal mucus membrane, has dentures Cardiovascular- tachycardic, no murmurs Respiratory- bilateral clear to auscultation, no wheeze, no rhonchi, no crackles Abdomen- bowel sounds present, soft, non tender Musculoskeletal- quadriplegia, restricted ROM at neck area, no cervical spine tenderness, contracture in her feet,  no leg edema Neurological- alert and oriented to person only at present and at baseline is aao x3. She has new right sided facial droop and slurred speech Skin- warm and dry, good capillary refill  Labs Lab Results  Component Value Date   HGBA1C 6.9* 02/26/2015   CBC Latest Ref Rng 05/25/2015 09/17/2014 03/13/2014  WBC - 6.4 7.1 7.4  Hemoglobin 12.0 - 16.0 g/dL 8.4(A) 12.7 12.6  Hematocrit 36 - 46 % 31(A) 39 42  Platelets 150 - 399 K/L 468(A) 300 257   CMP Latest Ref Rng 05/25/2015 03/19/2015 09/17/2014  Glucose 70 - 99 mg/dL - - -  BUN 4 - 21 mg/dL 15 18 30(A)  Creatinine 0.5 - 1.1 mg/dL 0.4(A) 0.5 0.5  Sodium 137 - 147 mmol/L 141 144 139  Potassium 3.4 - 5.3 mmol/L 4.3 4.6 5.1  Chloride 96 - 112 mEq/L - - -  CO2 19 - 32 mEq/L - - -  Calcium 8.4 - 10.5 mg/dL - - -  Total Protein 6.0 - 8.3 g/dL - - -  Total Bilirubin 0.3 - 1.2 mg/dL - - -  Alkaline Phos 39 - 117 U/L - - -  AST 0 - 37 U/L - - -  ALT 0 - 35 U/L - - -    Assessment/plan  Acute CVA With new onset slurred speech and right facial droop along with slowing of her mentation, hypotension, hypoxia and tachycardia all of new onset raising concerning for TIA vs acute CVA. Has history of HTN, DM, HLD increasing risk for stroke. Send patient to ED for CVA workup. New symptom onset at 2:30 pm per nursing staff. Also has hypotension and tachycardia. Has possibility of  MS flare up as well with her history of MS.   Hypotension Had received a dosing of phenergan this am which could contribute some. Repeat check of BP remains low. With her hypoxia and tachycardia along with this drop in blood pressure, will need to rule out causes for V/Q mismatch like pulmonary embolism, pneumonia among others  Hypoxia o2 saturation 70% at bedside which has improved some with o2. Started on 3 l o2 and o2 sat between 88-90% now. Concern of possible aspiration vs PE. Sending patient to ED for further evaluation.  Tachycardia New onset, denies palpitation. Sending to ED to assess for arrhythmia  Reviewed care plan with nursing staff and patient is being sent via EMS to the ED for further evaluation. Spent more than 40 minutes in evaluation and care.  Blanchie Serve, MD  [facility] [phone] (Monday-Friday 8 am - 5 pm) [phone] (afterhours)

## 2015-07-07 NOTE — ED Notes (Signed)
Report attempted. Charge nurse to assign nurse and call back.

## 2015-07-07 NOTE — ED Provider Notes (Signed)
CSN: [id number]     Arrival date & time 07/07/15  1741 History   First MD Initiated Contact with Patient 07/07/15 1747     Chief Complaint  Patient presents with  . Code Sepsis     (Consider location/radiation/quality/duration/timing/severity/associated sxs/prior Treatment) HPI Patient is brought in by EMS from nursing home. Has severe multiple sclerosis and quadriplegia at baseline. Per EMS staff states she was at her baseline at 2:00 this afternoon. At 3:30 they noticed a right facial droop and called EMS for possible stroke. EMS evaluated the patient and did not notice any facial droop. They did find the patient to be hypoxic, tachycardic and hypotensive. She was placed on a nonrebreather. Patient does not have her dentures in. She is able to answer yes and no questions. She denies any current pain. She denies any difficulty breathing. She denies any recent choking or coughing. Patient denies any changes in her vision. Denies any new numbness or weakness. Past Medical History  Diagnosis Date  . Hypertension   . Diabetes mellitus   . GERD (gastroesophageal reflux disease)   . Neuromuscular disorder ([place])   . Multiple sclerosis ([place])   . Anxiety   . Hyperlipemia   . Pneumonia   . Colitis   . Unable to ambulate   . GI bleed 09/07/2012  . Type II or unspecified type diabetes mellitus without mention of complication, uncontrolled 02/03/2013  . Ulcerative colitis, unspecified 02/03/2013  . Chronic diastolic CHF (congestive heart failure) [facility])    Past Surgical History  Procedure Laterality Date  . Tubal ligation    . Flexible sigmoidoscopy  10/05/2011    Procedure: FLEXIBLE SIGMOIDOSCOPY;  Surgeon: Winfield Cunas., MD;  Location: [facility] ENDOS[facility];  Service: Endoscopy;  Laterality: N/A;   pt. coming from [place] daughter phone no. [phone] might come with her  sedation is needed, pt. is paraplegic  . Esophagogastroduodenoscopy N/A 09/09/2012    Procedure:  ESOPHAGOGASTRODUODENOSCOPY (EGD);  Surgeon: Winfield Cunas., MD;  Location: [facility] ENDOSCOPY;  Service: Endoscopy;  Laterality: N/A;  . Colonoscopy N/A 09/09/2012    Procedure: COLONOSCOPY;  Surgeon: Winfield Cunas., MD;  Location: [facility] ENDOSCOPY;  Service: Endoscopy;  Laterality: N/A;  possible flex sig   Family History  Problem Relation Age of Onset  . CAD Mother   . Diabetes Daughter   . Diabetes Daughter   . Diabetes Son    Social History  Substance Use Topics  . Smoking status: Former Smoker -- 1.50 packs/day for 30 years    Quit date: 11/16/1995  . Smokeless tobacco: Never Used  . Alcohol Use: No   OB History    No data available     Review of Systems  Eyes: Negative for visual disturbance.  Respiratory: Negative for cough, choking and shortness of breath.   Cardiovascular: Negative for chest pain.  Gastrointestinal: Negative for vomiting and abdominal pain.  Neurological: Negative for weakness and numbness.      Allergies  Contrast media and Penicillins  Home Medications   Prior to Admission medications   Medication Sig Start Date End Date Taking? Authorizing Provider  acetaminophen (TYLENOL) 325 MG tablet Take 650 mg by mouth every 4 (four) hours as needed.   Yes Historical Provider, MD  cetirizine (ZYRTEC) 10 MG tablet Take one tablet by mouth every night at bedtime for allergic rhinitis   Yes Historical Provider, MD  ferrous sulfate 325 (65 FE) MG EC tablet Take 325 mg by mouth 3 (three) times  daily with meals.   Yes Historical Provider, MD  gabapentin (NEURONTIN) 100 MG capsule 1 capsule by mouth three times daily for MS 06/01/15  Yes Lauree Chandler, NP  guaiFENesin (MUCINEX) 600 MG 12 hr tablet Take 600 mg by mouth 2 (two) times daily.   Yes Historical Provider, MD  linagliptin (TRADJENTA) 5 MG TABS tablet Take 1 tablet (5 mg total) by mouth daily. Patient taking differently: Take one tablet by mouth once daily for blood sugar 03/18/15  Yes Lauree Chandler,  NP  lisinopril (PRINIVIL,ZESTRIL) 2.5 MG tablet Take one tablet by mouth once daily for hypertension   Yes Historical Provider, MD  metFORMIN (GLUCOPHAGE) 500 MG tablet Take 1 tablet (500 mg total) by mouth daily with breakfast. Patient taking differently: Take 500 mg by mouth 2 (two) times daily with a meal. Take one tablet by mouth twice daily to control blood sugar 03/19/15  Yes Lauree Chandler, NP  metoprolol tartrate (LOPRESSOR) 25 MG tablet Take 1/2 tablet by mouth daily for blood pressure. HOLD for SBP <110 11/24/11  Yes Lezlie Octave Black, NP  pantoprazole (PROTONIX) 40 MG tablet Take one tablet by mouth twice daily for reflux   Yes Historical Provider, MD  polyethylene glycol (MIRALAX / GLYCOLAX) packet Mix powder in 4-8 oz of liquid once daily for constipation 06/01/15  Yes Lauree Chandler, NP  sulfaDIAZINE 500 MG tablet Take two tablets by mouth twice daily with H2O for IBS   Yes Historical Provider, MD  traZODone (DESYREL) 50 MG tablet Take 1/2 tablet by mouth every night at bedtime for insomnia   Yes Historical Provider, MD  vitamin B-12 100 MCG tablet Take 1 tablet (100 mcg total) by mouth daily. 09/11/12  Yes Monika Salk, MD  zinc oxide (BALMEX) 11.3 % CREA cream Apply cream topically to groin and buttock every shift as preventative care   Yes Historical Provider, MD   BP 116/74 mmHg  Pulse 126  Temp(Src) 98.7 F (37.1 C) (Oral)  Resp 25  Wt 124 lb (56.246 kg)  SpO2 86% Physical Exam  Constitutional: She appears well-developed and well-nourished. No distress.  HENT:  Head: Normocephalic and atraumatic.  Mouth/Throat: Oropharynx is clear and moist.  Dry mucous membranes  Eyes: EOM are normal. Pupils are equal, round, and reactive to light.  Pupils 3 mm and sluggish bilaterally.  Neck: Normal range of motion. Neck supple. No JVD present.  Cardiovascular: Regular rhythm.  Exam reveals no gallop and no friction rub.   No murmur heard. Tachycardia  Pulmonary/Chest: Effort normal.  No respiratory distress. She has no wheezes. She has no rales.  Patient has difficulty taking deep breaths. Diffuse scattered rhonchi appreciated in bilateral bases.   Abdominal: Soft. Bowel sounds are normal.  Musculoskeletal: Normal range of motion. She exhibits no edema or tenderness.  Bilateral 1+ pitting edema to the lower extremities. Distal pulses are palpable and equal.  Neurological: She is alert.  Patient is alert. She is following simple commands. She is mouthing words. When asked to smile initially there is some left-sided facial droop but this is corrected with effort. Patient has no forehead weakness. Sensation to bilateral face is intact. Patient with spastic paralysis bilateral upper and lower extremities.  Skin: Skin is warm and dry. No rash noted. No erythema.  Psychiatric: She has a normal mood and affect. Her behavior is normal.  Nursing note and vitals reviewed.   ED Course  Procedures (including critical care time) Labs Review Labs Reviewed  COMPREHENSIVE METABOLIC PANEL - Abnormal; Notable for the following:    Glucose, Bld 154 (*)    ALT 11 (*)    Total Bilirubin 0.1 (*)    All other components within normal limits  CBC WITH DIFFERENTIAL/PLATELET - Abnormal; Notable for the following:    RBC 5.96 (*)    HCT 49.5 (*)    MCH 23.7 (*)    MCHC 28.5 (*)    RDW 28.6 (*)    All other components within normal limits  URINALYSIS, ROUTINE W REFLEX MICROSCOPIC (NOT AT [facility]) - Abnormal; Notable for the following:    Color, Urine AMBER (*)    APPearance HAZY (*)    Bilirubin Urine SMALL (*)    Ketones, ur 15 (*)    Nitrite POSITIVE (*)    Leukocytes, UA MODERATE (*)    All other components within normal limits  D-DIMER, QUANTITATIVE (NOT AT [facility]) - Abnormal; Notable for the following:    D-Dimer, Quant 1.29 (*)    All other components within normal limits  URINE MICROSCOPIC-ADD ON - Abnormal; Notable for the following:    Squamous Epithelial / LPF 0-5 (*)     Bacteria, UA MANY (*)    Casts HYALINE CASTS (*)    All other components within normal limits  I-STAT ARTERIAL BLOOD GAS, ED - Abnormal; Notable for the following:    pH, Arterial 7.282 (*)    pCO2 arterial 68.9 (*)    pO2, Arterial 56.0 (*)    Bicarbonate 32.5 (*)    Acid-Base Excess 4.0 (*)    All other components within normal limits  I-STAT TROPOININ, ED - Abnormal; Notable for the following:    Troponin i, poc 0.31 (*)    All other components within normal limits  CULTURE, BLOOD (ROUTINE X 2)  CULTURE, BLOOD (ROUTINE X 2)  URINE CULTURE  BRAIN NATRIURETIC PEPTIDE  I-STAT CG4 LACTIC ACID, ED  I-STAT CG4 LACTIC ACID, ED    Imaging Review Dg Chest Port 1 View  07/07/2015  CLINICAL DATA:  Right-sided facial droop and lethargy.  Hypoxia. EXAM: PORTABLE CHEST 1 VIEW COMPARISON:  09/07/2012 FINDINGS: Midline trachea. Cardiomegaly accentuated by AP portable technique. Atherosclerosis in the transverse aorta. No pleural effusion or pneumothorax. Low lung volumes with resultant pulmonary interstitial prominence. Patchy left base airspace disease IMPRESSION: Low lung volumes. Patchy left base airspace disease is favored to represent atelectasis superimposed upon scarring. Early infection or aspiration cannot be excluded. Cardiomegaly with aortic atherosclerosis. Electronically Signed   By: Abigail Miyamoto M.D.   On: 07/07/2015 18:42   I have personally reviewed and evaluated these images and lab results as part of my medical decision-making.   EKG Interpretation   Date/Time:  Tuesday July 07 2015 17:50:18 EST Ventricular Rate:  125 PR Interval:  162 QRS Duration: 88 QT Interval:  318 QTC Calculation: 458 R Axis:   102 Text Interpretation:  Sinus tachycardia Probable left atrial enlargement  Anteroseptal infarct, age indeterminate Confirmed by Lita Mains  MD, Izell Labat  ([id number]) on 07/07/2015 8:15:13 PM     CRITICAL CARE Performed by: Lita Mains, Damareon Lanni Total critical care time: 30  minutes Critical care time was exclusive of separately billable procedures and treating other patients. Critical care was necessary to treat or prevent imminent or life-threatening deterioration. Critical care was time spent personally by me on the following activities: development of treatment plan with patient and/or surrogate as well as nursing, discussions with consultants, evaluation of patient's response to treatment, examination of patient,  obtaining history from patient or surrogate, ordering and performing treatments and interventions, ordering and review of laboratory studies, ordering and review of radiographic studies, pulse oximetry and re-evaluation of patient's condition. MDM   Final diagnoses:  UTI (lower urinary tract infection)  Hypoxia  Respiratory acidosis  Tachycardia    Patient has no appreciated new deficits. Has a history of GI bleed and severe multiple sclerosis with quadriplegia. Even without this, do not believe she would be a candidate for tPA. With hypoxia, tachycardia and hypotension concern for possible sepsis. This appears to be the most immediate life threatening event. We'll initiate sepsis protocol and continue to monitor neurologic status. Patient is a full code. When asked if she were to stop breathing she would want CPR and a "breathing tube" she mouths that she would.  Patient on BiPAP after ABG showed elevated PCO2. Questional atelectasis versus infiltrate onTo step down bed x-ray. Patient is afebrile with a normal white count and lactic acid. Do not believe the patient is septic. Patient does have evidence of a urinary tract infection. This does not explain her hypoxia and hypercarbia. Aspiration is definitely a possibility as well as progressive respiratory failure due to multiple sclerosis. Patient with a known penicillin allergy. Started on Levaquin for UTI.  Blood pressure remained stable in the emergency department. Initial sepsis bolus order was DC'd due  to concern pulmonary edema. With normal lactic acid and blood pressure will give fluids judiciously. Patient has an elevated d-dimer but has allergy to IV contrast. May need a VQ scan in hospital as patient certainly at risk for PE.  Discussed with hospitalist. Will admit.    Julianne Rice, MD 07/11/15 [phone]

## 2015-07-07 NOTE — Progress Notes (Signed)
ANTIBIOTIC CONSULT NOTE - INITIAL  Pharmacy Consult for Vancomycin and Zosyn Indication: rule out pneumonia  Allergies  Allergen Reactions  . Contrast Media [Iodinated Diagnostic Agents]     Unknown  . Penicillins     Unknown    Patient Measurements: Weight: 124 lb (56.246 kg) Adjusted Body Weight:   Vital Signs: Temp: 98.7 F (37.1 C) (12/27 1752) Temp Source: Oral (12/27 1752) BP: 115/60 mmHg (12/27 2045) Pulse Rate: 128 (12/27 2045) Intake/Output from previous day:   Intake/Output from this shift:    Labs:  Recent Labs  07/07/15 1805  WBC 8.7  HGB 14.1  PLT PLATELET CLUMPS NOTED ON SMEAR, COUNT APPEARS ADEQUATE  CREATININE 0.46   Estimated Creatinine Clearance: 50 mL/min (by C-G formula based on Cr of 0.46). No results for input(s): VANCOTROUGH, VANCOPEAK, VANCORANDOM, GENTTROUGH, GENTPEAK, GENTRANDOM, TOBRATROUGH, TOBRAPEAK, TOBRARND, AMIKACINPEAK, AMIKACINTROU, AMIKACIN in the last 72 hours.   Microbiology: No results found for this or any previous visit (from the past 720 hour(s)).  Medical History: Past Medical History  Diagnosis Date  . Hypertension   . Diabetes mellitus   . GERD (gastroesophageal reflux disease)   . Neuromuscular disorder ([place])   . Multiple sclerosis ([place])   . Anxiety   . Hyperlipemia   . Pneumonia   . Colitis   . Unable to ambulate   . GI bleed 09/07/2012  . Type II or unspecified type diabetes mellitus without mention of complication, uncontrolled 02/03/2013  . Ulcerative colitis, unspecified 02/03/2013  . Chronic diastolic CHF (congestive heart failure) ([facility])     Medications:   (Not in a hospital admission) Scheduled:  . [START ON 07/08/2015] ferrous sulfate  325 mg Oral TID WC  . gabapentin  100 mg Oral TID  . guaiFENesin  600 mg Oral BID  . heparin  5,000 Units Subcutaneous 3 times per day  . [START ON 07/08/2015] insulin aspart  0-9 Units Subcutaneous TID WC  . [START ON 07/08/2015] levalbuterol  1.25 mg Nebulization  4 times per day  . loratadine  10 mg Oral Daily  . LORazepam      . LORazepam  0.5 mg Intravenous Once  . metoprolol tartrate  12.5 mg Oral Daily  . pantoprazole (PROTONIX) IV  40 mg Intravenous Q24H  . sulfaDIAZINE  1,000 mg Oral Q12H  . vitamin B-12  100 mcg Oral Daily  . zinc oxide   Topical Daily   Infusions:  . sodium chloride    . levofloxacin (LEVAQUIN) IV 750 mg (07/07/15 2025)  . sodium chloride    . sodium chloride 500 mL (07/07/15 2025)   Assessment: 80yo female with history of MS, HTN, DM2, and CHF presents from SNF with R facial droop and concern for stroke. Pharmacy is consulted to dose primaxin and vancomycin for suspected aspiration pneumonia vs HCAP. Pt is afebrile, WBC 8.7, sCr 0.46.  Pt received vancomycin 1g and zosyn 3.375g IV once in the ED.  Goal of Therapy:  Vancomycin trough level 15-20 mcg/ml  Plan:  Vancomycin 500mg  IV q12h Zosyn 3.375g IV q8h Expected duration 7 days with resolution of temperature and/or normalization of WBC Measure antibiotic drug levels at steady state Follow up culture results, renal function and clinical course  Andrey Cota. Diona Foley, PharmD, [place] Clinical Pharmacist Pager [phone] 07/07/2015,8:59 PM

## 2015-07-07 NOTE — ED Notes (Signed)
Pt is not tolerating BIPAP. bipap was adjusted multiple times and respiratory was called to re adjust. Pt still refusing to wear mask. Bipap mask removed and placed back on 3L [place]. MD made aware and admitting MD at bedside at this time.

## 2015-07-07 NOTE — H&P (Addendum)
[facility] History and Physical  [name] [id number] DOB: [birth year] DOA: 07/07/2015  Referring physician: ED physician PCP: Blanchie Serve, MD  Specialists:   Chief Complaint: Productive cough, shortness of breath  HPI: [name] is a 80 y.o. female with PMH of hypertension, diabetes mellitus, GERD, anxiety, IBD, UC, GI bleeding, severe MS with quadriplegia and difficulty speaking, who presents with productive cough and shortness of breath.  Patient is from a nursing home. Patient is brought initially because of possible right facial droop. Per report, pt was last seen normal at 1400. Staff rounded at 1530 and pt was noticed to have possible right sided facial droop and seemed lethergic. Upon arrival to ED, pt does not have facial droop, but has shortness of breath and productive cough with yellow cartilage sputum production. She has a hypotension with blood pressure 85/54, which improved to 117/72 after IV fluid. Patient denies symptoms for UTI, abdominal pain, diarrhea. No chest pain currently.  In ED, patient was found to have positive urinalysis for UTI, positive troponin 0.31-->1.23, lactate 1.8, WBC 8.7, temperature normal, tachycardia, tachypnea, oxygen desaturations to 70% on room air. Chest x-ray showed patchy left base airspace disease. Patient submitted 3 inpatient for further eval and treatment.  Where does patient live?   SNF Can patient participate in ADLs? None  Review of Systems:   General: no fevers, chills, no changes in body weight, has poor appetite, has fatigue HEENT: no blurry vision, hearing changes or sore throat Pulm: has dyspnea, coughing, no wheezing CV: no chest pain, palpitations Abd: no nausea, vomiting, abdominal pain, diarrhea, constipation GU: no dysuria, burning on urination, increased urinary frequency, hematuria  Ext: no leg edema Neuro: has quadriplegia. Skin: no rash MSK:  no limitation of range of movement in spin Heme: No easy  bruising.  Travel history: No recent long distant travel.  Allergy:  Allergies  Allergen Reactions  . Contrast Media [Iodinated Diagnostic Agents]     Unknown    Past Medical History  Diagnosis Date  . Hypertension   . Diabetes mellitus   . GERD (gastroesophageal reflux disease)   . Neuromuscular disorder ([place])   . Multiple sclerosis ([place])   . Anxiety   . Hyperlipemia   . Pneumonia   . Colitis   . Unable to ambulate   . GI bleed 09/07/2012  . Type II or unspecified type diabetes mellitus without mention of complication, uncontrolled 02/03/2013  . Ulcerative colitis, unspecified 02/03/2013  . Chronic diastolic CHF (congestive heart failure) [facility])     Past Surgical History  Procedure Laterality Date  . Tubal ligation    . Flexible sigmoidoscopy  10/05/2011    Procedure: FLEXIBLE SIGMOIDOSCOPY;  Surgeon: Winfield Cunas., MD;  Location: [facility];  Service: Endoscopy;  Laterality: N/A;   pt. coming from [place] daughter phone no. [phone] might come with her  sedation is needed, pt. is paraplegic  . Esophagogastroduodenoscopy N/A 09/09/2012    Procedure: ESOPHAGOGASTRODUODENOSCOPY (EGD);  Surgeon: Winfield Cunas., MD;  Location: [facility] ENDOSCOPY;  Service: Endoscopy;  Laterality: N/A;  . Colonoscopy N/A 09/09/2012    Procedure: COLONOSCOPY;  Surgeon: Winfield Cunas., MD;  Location: [facility] ENDOSCOPY;  Service: Endoscopy;  Laterality: N/A;  possible flex sig    Social History:  reports that she quit smoking about 19 years ago. She has never used smokeless tobacco. She reports that she does not drink alcohol or use illicit drugs.  Family History:  Family History  Problem  Relation Age of Onset  . CAD Mother   . Diabetes Daughter   . Diabetes Daughter   . Diabetes Son      Prior to Admission medications   Medication Sig Start Date End Date Taking? Authorizing Provider  acetaminophen (TYLENOL) 325 MG tablet Take 650 mg by mouth every 4 (four)  hours as needed.   Yes Historical Provider, MD  cetirizine (ZYRTEC) 10 MG tablet Take one tablet by mouth every night at bedtime for allergic rhinitis   Yes Historical Provider, MD  ferrous sulfate 325 (65 FE) MG EC tablet Take 325 mg by mouth 3 (three) times daily with meals.   Yes Historical Provider, MD  gabapentin (NEURONTIN) 100 MG capsule 1 capsule by mouth three times daily for MS 06/01/15  Yes Lauree Chandler, NP  guaiFENesin (MUCINEX) 600 MG 12 hr tablet Take 600 mg by mouth 2 (two) times daily.   Yes Historical Provider, MD  linagliptin (TRADJENTA) 5 MG TABS tablet Take 1 tablet (5 mg total) by mouth daily. Patient taking differently: Take one tablet by mouth once daily for blood sugar 03/18/15  Yes Lauree Chandler, NP  lisinopril (PRINIVIL,ZESTRIL) 2.5 MG tablet Take one tablet by mouth once daily for hypertension   Yes Historical Provider, MD  metFORMIN (GLUCOPHAGE) 500 MG tablet Take 1 tablet (500 mg total) by mouth daily with breakfast. Patient taking differently: Take 500 mg by mouth 2 (two) times daily with a meal. Take one tablet by mouth twice daily to control blood sugar 03/19/15  Yes Lauree Chandler, NP  metoprolol tartrate (LOPRESSOR) 25 MG tablet Take 1/2 tablet by mouth daily for blood pressure. HOLD for SBP <110 11/24/11  Yes Lezlie Octave Black, NP  pantoprazole (PROTONIX) 40 MG tablet Take one tablet by mouth twice daily for reflux   Yes Historical Provider, MD  polyethylene glycol (MIRALAX / GLYCOLAX) packet Mix powder in 4-8 oz of liquid once daily for constipation 06/01/15  Yes Lauree Chandler, NP  sulfaDIAZINE 500 MG tablet Take two tablets by mouth twice daily with H2O for IBS   Yes Historical Provider, MD  traZODone (DESYREL) 50 MG tablet Take 1/2 tablet by mouth every night at bedtime for insomnia   Yes Historical Provider, MD  vitamin B-12 100 MCG tablet Take 1 tablet (100 mcg total) by mouth daily. 09/11/12  Yes Monika Salk, MD  zinc oxide (BALMEX) 11.3 % CREA cream  Apply cream topically to groin and buttock every shift as preventative care   Yes Historical Provider, MD    Physical Exam: Filed Vitals:   07/08/15 0100 07/08/15 0200 07/08/15 0255 07/08/15 0300  BP: 101/51 96/48  90/47  Pulse: 111 103  107  Temp: 98.7 F (37.1 C)     TempSrc: Oral     Resp: 23 24  26   Weight:      SpO2: 94% 98% 95% 86%   General: Not in acute distress HEENT:       Eyes: PERRL, EOMI, no scleral icterus.       ENT: No discharge from the ears and nose, no pharynx injection, no tonsillar enlargement.        Neck: No JVD, no bruit, no mass felt. Heme: No neck lymph node enlargement. Cardiac: S1/S2, RRR, tachycardia, No murmurs, No gallops or rubs. Pulm:  No rales, wheezing, rhonchi or rubs. Abd: Soft, nondistended, nontender, no rebound pain, no organomegaly, BS present. Ext: No pitting leg edema bilaterally. 2+DP/PT pulse bilaterally. Musculoskeletal: No joint  deformities, No joint redness or warmth, no limitation of ROM in spin. Skin: No rashes.  Neuro: Alert, oriented X3, cranial nerves II-XII grossly intact, has quadriplegia. Psych: Patient is not psychotic, no suicidal or hemocidal ideation.  Labs on Admission:  Basic Metabolic Panel:  Recent Labs Lab 07/07/15 1805  NA 145  K 4.7  CL 105  CO2 31  GLUCOSE 154*  BUN 12  CREATININE 0.46  CALCIUM 10.0   Liver Function Tests:  Recent Labs Lab 07/07/15 1805  AST 23  ALT 11*  ALKPHOS 63  BILITOT 0.1*  PROT 7.6  ALBUMIN 3.5   No results for input(s): LIPASE, AMYLASE in the last 168 hours. No results for input(s): AMMONIA in the last 168 hours. CBC:  Recent Labs Lab 07/07/15 1805  WBC 8.7  NEUTROABS 5.2  HGB 14.1  HCT 49.5*  MCV 83.1  PLT PLATELET CLUMPS NOTED ON SMEAR, COUNT APPEARS ADEQUATE   Cardiac Enzymes:  Recent Labs Lab 07/07/15 2049  TROPONINI 1.23*    BNP (last 3 results)  Recent Labs  07/07/15 1805  BNP 36.9    ProBNP (last 3 results) No results for input(s):  PROBNP in the last 8760 hours.  CBG:  Recent Labs Lab 07/08/15 0024  GLUCAP 101*    Radiological Exams on Admission: Dg Chest Port 1 View  07/07/2015  CLINICAL DATA:  Right-sided facial droop and lethargy.  Hypoxia. EXAM: PORTABLE CHEST 1 VIEW COMPARISON:  09/07/2012 FINDINGS: Midline trachea. Cardiomegaly accentuated by AP portable technique. Atherosclerosis in the transverse aorta. No pleural effusion or pneumothorax. Low lung volumes with resultant pulmonary interstitial prominence. Patchy left base airspace disease IMPRESSION: Low lung volumes. Patchy left base airspace disease is favored to represent atelectasis superimposed upon scarring. Early infection or aspiration cannot be excluded. Cardiomegaly with aortic atherosclerosis. Electronically Signed   By: Abigail Miyamoto M.D.   On: 07/07/2015 18:42    EKG: Independently reviewed. QTC 458, LAE, poor R-wave progression which is new, RAD  Assessment/Plan Principal Problem:   Acute respiratory failure with hypoxia and hypercarbia (HCC) Active Problems:   MS (multiple sclerosis) (HCC)   Ulcerative colitis (HCC)   GERD (gastroesophageal reflux disease)   Essential hypertension, benign   Anxiety state   Diabetes mellitus type II, controlled, with no complications (HCC)   UTI (lower urinary tract infection)   NSTEMI (non-ST elevated myocardial infarction) (HCC)   Chronic diastolic CHF (congestive heart failure) (HCC)   Sepsis (HCC)   Aspiration pneumonia ([place])   HCAP (healthcare-associated pneumonia)   Acute respiratory failure with hypoxia and hypercarbia ([place]): this is likely due to HCAP vs. aspiration pneumonia.  - Will admit to SDU - IV Vancomycin and Zosyn (pt was listed as allergy to penicillin, but family denied that this allergy per pharmacy) - Mucinex for cough  - Xopenex Neb prn for SOB - Urine legionella and S. pneumococcal antigen - Follow up blood culture x2, sputum culture  - NPO until SLP done  UTI:  -on IV  vancomycin and Zosyn as above -Follow up blood culture and urine culture  Sepsis 2/2 UTI and PNA: Patient meets criteria for sepsis with tachycardia, tachypnea and hypotension. Blood pressure responded to IV fluids. Currently hemodynamically stable. -will get Procalcitonin and trend lactic acid levels per sepsis protocol. -IVF: 1.5L of NS bolus in ED, followed by 75 cc/h (patient has a congestive heart failure, limiting aggressive IV fluids treatment). -On IV Abx as above  NSTMI: trop 0.31-->1.23. Pt denies chest pain. This may be  due to demandibng ischemia secondary to sepsis. - cycle CE q6 x3 and repeat her EKG in the am  - Nitroglycerin, Morphine, metoprolol, and lipitor  - will start ASA 81 mg and need to carefully monitor GI bleeding given her history of GI bleeding. - Risk factor stratification: will check FLP and A1C  - 2d echo - Need to get cardiology consult in AM  Addendum: trop 0.31-->1.23 without chest pain. -->started IV heparin.  Chronic diastolic congestive heart failure: 2-D echo on 09/04/09 showed a year 70-75% with grade 1 diastolic dysfunction. BNP 36.9. CHF is compensated on admission. -Continue metoprolol  MS (multiple sclerosis) ([place]): No acute issues. -Observe closely  GERD: -Protonix   GERD: -Protonix IV  HTN:  -On metoprolol -Hold Lisinopril -PRN hydralazine IV  Ulcerative colitis (HCC) and IBS: stable -continue sulfadiazine  DM-II: Last A1c 6.9 on 02/26/15, well controled. Patient is taking metformin and Tradjenta at home -SSI -Check A1c   DVT ppx: on Heparin gtt Code Status: Full code Family Communication: Yes, patient's daughter at bed side Disposition Plan: Admit to inpatient   Date of Service 07/08/2015    Ivor Costa [facility] Pager [phone]  If 7PM-7AM, please contact night-coverage [email or web address] Password [facility] 07/08/2015, 3:47 AM

## 2015-07-08 ENCOUNTER — Inpatient Hospital Stay (HOSPITAL_COMMUNITY): Payer: Medicare Other

## 2015-07-08 DIAGNOSIS — I214 Non-ST elevation (NSTEMI) myocardial infarction: Secondary | ICD-10-CM

## 2015-07-08 DIAGNOSIS — J690 Pneumonitis due to inhalation of food and vomit: Secondary | ICD-10-CM

## 2015-07-08 DIAGNOSIS — G35 Multiple sclerosis: Secondary | ICD-10-CM

## 2015-07-08 DIAGNOSIS — I213 ST elevation (STEMI) myocardial infarction of unspecified site: Secondary | ICD-10-CM

## 2015-07-08 DIAGNOSIS — A419 Sepsis, unspecified organism: Principal | ICD-10-CM

## 2015-07-08 DIAGNOSIS — E119 Type 2 diabetes mellitus without complications: Secondary | ICD-10-CM

## 2015-07-08 LAB — BASIC METABOLIC PANEL
ANION GAP: 7 (ref 5–15)
BUN: 9 mg/dL (ref 6–20)
CALCIUM: 8.8 mg/dL — AB (ref 8.9–10.3)
CO2: 29 mmol/L (ref 22–32)
Chloride: 106 mmol/L (ref 101–111)
Creatinine, Ser: 0.33 mg/dL — ABNORMAL LOW (ref 0.44–1.00)
GLUCOSE: 122 mg/dL — AB (ref 65–99)
POTASSIUM: 4.1 mmol/L (ref 3.5–5.1)
SODIUM: 142 mmol/L (ref 135–145)

## 2015-07-08 LAB — LIPID PANEL
Cholesterol: 154 mg/dL (ref 0–200)
HDL: 24 mg/dL — AB (ref >40)
LDL Cholesterol: 101 mg/dL — ABNORMAL HIGH (ref 0–99)
Total CHOL/HDL Ratio: 6.4 RATIO
Triglycerides: 144 mg/dL (ref <150)
VLDL: 29 mg/dL (ref 0–40)

## 2015-07-08 LAB — BLOOD GAS, ARTERIAL
Acid-Base Excess: 3.2 mmol/L — ABNORMAL HIGH (ref 0.0–2.0)
BICARBONATE: 29.6 meq/L — AB (ref 20.0–24.0)
DRAWN BY: 36496
FIO2: 0.36
O2 SAT: 98.9 %
PO2 ART: 152 mmHg — AB (ref 80.0–100.0)
Patient temperature: 98.6
TCO2: 31.7 mmol/L (ref 0–100)
pCO2 arterial: 67.6 mmHg (ref 35.0–45.0)
pH, Arterial: 7.264 — ABNORMAL LOW (ref 7.350–7.450)

## 2015-07-08 LAB — GLUCOSE, CAPILLARY
GLUCOSE-CAPILLARY: 101 mg/dL — AB (ref 65–99)
GLUCOSE-CAPILLARY: 108 mg/dL — AB (ref 65–99)
GLUCOSE-CAPILLARY: 131 mg/dL — AB (ref 65–99)
Glucose-Capillary: 158 mg/dL — ABNORMAL HIGH (ref 65–99)
Glucose-Capillary: 140 mg/dL — ABNORMAL HIGH (ref 65–99)
Glucose-Capillary: 107 mg/dL — ABNORMAL HIGH (ref 65–99)

## 2015-07-08 LAB — MRSA PCR SCREENING: MRSA BY PCR: NEGATIVE

## 2015-07-08 LAB — LACTIC ACID, PLASMA
LACTIC ACID, VENOUS: 0.6 mmol/L (ref 0.5–2.0)
Lactic Acid, Venous: 1.1 mmol/L (ref 0.5–2.0)

## 2015-07-08 LAB — EXPECTORATED SPUTUM ASSESSMENT W REFEX TO RESP CULTURE

## 2015-07-08 LAB — APTT: APTT: 33 s (ref 24–37)

## 2015-07-08 LAB — HEPARIN LEVEL (UNFRACTIONATED): HEPARIN UNFRACTIONATED: 0.27 [IU]/mL — AB (ref 0.30–0.70)

## 2015-07-08 LAB — PROCALCITONIN

## 2015-07-08 LAB — EXPECTORATED SPUTUM ASSESSMENT W GRAM STAIN, RFLX TO RESP C

## 2015-07-08 LAB — STREP PNEUMONIAE URINARY ANTIGEN: Strep Pneumo Urinary Antigen: NEGATIVE

## 2015-07-08 LAB — TROPONIN I
TROPONIN I: 1.12 ng/mL — AB (ref <0.031)
TROPONIN I: 0.99 ng/mL — AB (ref <0.031)
Troponin I: 1.23 ng/mL (ref <0.031)

## 2015-07-08 LAB — PROTIME-INR
INR: 1.24 (ref 0.00–1.49)
PROTHROMBIN TIME: 15.8 s — AB (ref 11.6–15.2)

## 2015-07-08 MED ORDER — SODIUM CHLORIDE 0.9 % IV SOLN
INTRAVENOUS | Status: DC
  Administered 2015-07-08: 11:00:00 via INTRAVENOUS

## 2015-07-08 MED ORDER — ONDANSETRON HCL 4 MG/2ML IJ SOLN: 4.0000 mg | Freq: Four times a day (QID) | INTRAMUSCULAR | Status: DC | PRN

## 2015-07-08 MED ORDER — MORPHINE SULFATE (PF) 2 MG/ML IV SOLN: 2.0000 mg | INTRAVENOUS | Status: DC | PRN

## 2015-07-08 MED ORDER — NITROGLYCERIN 0.4 MG SL SUBL: 0.4000 mg | SUBLINGUAL_TABLET | SUBLINGUAL | Status: DC | PRN

## 2015-07-08 MED ORDER — VANCOMYCIN HCL 500 MG IV SOLR
500.0000 mg | Freq: Two times a day (BID) | INTRAVENOUS | Status: DC
  Administered 2015-07-08 – 2015-07-10 (×5): 500 mg via INTRAVENOUS
  Filled 2015-07-08 (×7): qty 500

## 2015-07-08 MED ORDER — HEPARIN (PORCINE) IN NACL 100-0.45 UNIT/ML-% IJ SOLN
800.0000 [IU]/h | INTRAMUSCULAR | Status: DC
  Administered 2015-07-08: 800 [IU]/h via INTRAVENOUS

## 2015-07-08 MED ORDER — HEPARIN (PORCINE) IN NACL 100-0.45 UNIT/ML-% IJ SOLN
900.0000 [IU]/h | INTRAMUSCULAR | Status: DC
  Administered 2015-07-08 – 2015-07-09 (×2): 800 [IU]/h via INTRAVENOUS
  Filled 2015-07-08: qty 250

## 2015-07-08 MED ORDER — PIPERACILLIN-TAZOBACTAM 3.375 G IVPB
3.3750 g | Freq: Three times a day (TID) | INTRAVENOUS | Status: DC
Start: 2015-07-08 — End: 2015-07-10
  Administered 2015-07-08 – 2015-07-10 (×7): 3.375 g via INTRAVENOUS
  Filled 2015-07-08 (×10): qty 50

## 2015-07-08 MED ORDER — RESOURCE THICKENUP CLEAR PO POWD
ORAL | Status: DC | PRN
Start: 2015-07-08 — End: 2015-07-11
  Filled 2015-07-08: qty 125

## 2015-07-08 MED ORDER — ASPIRIN EC 81 MG PO TBEC
81.0000 mg | DELAYED_RELEASE_TABLET | Freq: Every day | ORAL | Status: DC
  Administered 2015-07-08 – 2015-07-11 (×4): 81 mg via ORAL
  Filled 2015-07-08 (×5): qty 1

## 2015-07-08 MED ORDER — HEPARIN (PORCINE) IN NACL 100-0.45 UNIT/ML-% IJ SOLN
700.0000 [IU]/h | INTRAMUSCULAR | Status: DC
  Administered 2015-07-08: 700 [IU]/h via INTRAVENOUS
  Filled 2015-07-08: qty 250

## 2015-07-08 MED ORDER — INSULIN ASPART 100 UNIT/ML ~~LOC~~ SOLN
0.0000 [IU] | SUBCUTANEOUS | Status: DC
  Administered 2015-07-08 – 2015-07-09 (×3): 1 [IU] via SUBCUTANEOUS
  Administered 2015-07-09: 3 [IU] via SUBCUTANEOUS
  Administered 2015-07-10: 2 [IU] via SUBCUTANEOUS

## 2015-07-08 MED ORDER — ATORVASTATIN CALCIUM 40 MG PO TABS
40.0000 mg | ORAL_TABLET | Freq: Every day | ORAL | Status: DC
Start: 2015-07-08 — End: 2015-07-11
  Administered 2015-07-08 – 2015-07-10 (×4): 40 mg via ORAL
  Filled 2015-07-08 (×4): qty 1

## 2015-07-08 NOTE — Progress Notes (Signed)
ANTICOAGULATION CONSULT NOTE - Follow Up Consult  Pharmacy Consult:  Heparin Indication: chest pain/ACS  Allergies  Allergen Reactions  . Contrast Media [Iodinated Diagnostic Agents]     Unknown    Patient Measurements: Weight: 124 lb (56.246 kg) Heparin Dosing Weight: 56 kg  Vital Signs: Temp: 98.1 F (36.7 C) (12/28 1203) Temp Source: Oral (12/28 1203) BP: 99/49 mmHg (12/28 1203) Pulse Rate: 95 (12/28 1203)  Labs:  Recent Labs  07/07/15 1805 07/07/15 2049 07/08/15 0309 07/08/15 0720 07/08/15 1105  HGB 14.1  --   --   --   --   HCT 49.5*  --   --   --   --   PLT PLATELET CLUMPS NOTED ON SMEAR, COUNT APPEARS ADEQUATE  --   --   --   --   APTT  --  33  --   --   --   LABPROT  --  15.8*  --   --   --   INR  --  1.24  --   --   --   HEPARINUNFRC  --   --   --   --  0.27*  CREATININE 0.46  --   --  0.33*  --   TROPONINI  --  1.23* 1.12* 0.99*  --     Estimated Creatinine Clearance: 50 mL/min (by C-G formula based on Cr of 0.33).      Assessment: 59 YOF with history of GIB in 2014 to continue on IV heparin fo possible ACS.  Heparin level is slightly sub-therapeutic; no bleeding reported.   Goal of Therapy:  Heparin level 0.3-0.7 units/ml Monitor platelets by anticoagulation protocol: Yes    Plan:  - Increase heparin gtt to 800 units/hr - Check 8 hr HL - Daily HL / CBC - Continue vanc 500mg  IV Q12H and Zosyn 3.375gm IV Q8H, 4 hr infusion - Monitor renal fxn, clinical progress, vanc trough as indicated   Brianna Niess D. Mina Heath, PharmD, BCPS Pager:  [phone] 07/08/2015, 12:22 PM

## 2015-07-08 NOTE — Progress Notes (Signed)
Pt noted to be obtunded. Pt is unarousable with sternal rub. Facial droop to right side more notable than earlier in shift. Dr Sheran Fava notified. Rapid response notified. Vital signs within normal limits. Awaiting any new orders. Will continue to monitor pt.

## 2015-07-08 NOTE — Progress Notes (Signed)
Down with pt to CT of head, pt is stable and alert x 4 now. Will monitor closely. NP made aware of ABG results, awaiting orders.

## 2015-07-08 NOTE — Progress Notes (Signed)
ABG drawn per MD order. Critical PCO2 result called to Jerene Pitch, RN w/ Rapid Response. Upon arrival to [facility]. RN stated she was called MD with results. Pt was preparing to transport to CT. Pt had be on BIPAP last night during ED admission. No ABG was drawn prior to patient coming off BIPAP today. PCO2 very comparable to last night. Pt is alert and answering questions. I explained we may need to go back on BIPAP and she seemed hesitant. RN will call when MD calls back. BIPAP on standby in room. RT will monitor.

## 2015-07-08 NOTE — Progress Notes (Signed)
Upon arrival patient was complaining the BIPAP mask was hurting her face. I removed mask and there were red areas where the mask had been. [name] begged me to not put the mask back on. She was crying and said her eye were hurting. RN was at bedside during this. Pt was placed on 4L . Pt is comfortable at this time. RT will monitor. RN is calling MD

## 2015-07-08 NOTE — Progress Notes (Signed)
ANTICOAGULATION CONSULT NOTE - Initial Consult  Pharmacy Consult for Heparin Indication: chest pain/ACS  Allergies  Allergen Reactions  . Contrast Media [Iodinated Diagnostic Agents]     Unknown    Patient Measurements: Weight: 124 lb (56.246 kg) Heparin Dosing Weight: 56 kg  Vital Signs: Temp: 98.7 F (37.1 C) (12/28 0100) Temp Source: Oral (12/28 0100) BP: 96/48 mmHg (12/28 0200) Pulse Rate: 103 (12/28 0200)  Labs:  Recent Labs  07/07/15 1805 07/07/15 2049  HGB 14.1  --   HCT 49.5*  --   PLT PLATELET CLUMPS NOTED ON SMEAR, COUNT APPEARS ADEQUATE  --   APTT  --  33  LABPROT  --  15.8*  INR  --  1.24  CREATININE 0.46  --   TROPONINI  --  1.23*    Estimated Creatinine Clearance: 50 mL/min (by C-G formula based on Cr of 0.46).   Medical History: Past Medical History  Diagnosis Date  . Hypertension   . Diabetes mellitus   . GERD (gastroesophageal reflux disease)   . Neuromuscular disorder ([place])   . Multiple sclerosis ([place])   . Anxiety   . Hyperlipemia   . Pneumonia   . Colitis   . Unable to ambulate   . GI bleed 09/07/2012  . Type II or unspecified type diabetes mellitus without mention of complication, uncontrolled 02/03/2013  . Ulcerative colitis, unspecified 02/03/2013  . Chronic diastolic CHF (congestive heart failure) (HCC)     Medications:  Prescriptions prior to admission  Medication Sig Dispense Refill Last Dose  . acetaminophen (TYLENOL) 325 MG tablet Take 650 mg by mouth every 4 (four) hours as needed.   07/07/2015 at Unknown time  . cetirizine (ZYRTEC) 10 MG tablet Take one tablet by mouth every night at bedtime for allergic rhinitis   07/06/2015 at Unknown time  . ferrous sulfate 325 (65 FE) MG EC tablet Take 325 mg by mouth 3 (three) times daily with meals.   07/07/2015 at Unknown time  . gabapentin (NEURONTIN) 100 MG capsule 1 capsule by mouth three times daily for MS   07/07/2015 at Unknown time  . guaiFENesin (MUCINEX) 600 MG 12 hr tablet  Take 600 mg by mouth 2 (two) times daily.   07/07/2015 at Unknown time  . linagliptin (TRADJENTA) 5 MG TABS tablet Take 1 tablet (5 mg total) by mouth daily. (Patient taking differently: Take one tablet by mouth once daily for blood sugar) 30 tablet  07/06/2015 at Unknown time  . lisinopril (PRINIVIL,ZESTRIL) 2.5 MG tablet Take one tablet by mouth once daily for hypertension   07/07/2015 at Unknown time  . metFORMIN (GLUCOPHAGE) 500 MG tablet Take 1 tablet (500 mg total) by mouth daily with breakfast. (Patient taking differently: Take 500 mg by mouth 2 (two) times daily with a meal. Take one tablet by mouth twice daily to control blood sugar) 180 tablet 3 07/06/2015 at Unknown time  . metoprolol tartrate (LOPRESSOR) 25 MG tablet Take 1/2 tablet by mouth daily for blood pressure. HOLD for SBP <110   07/07/2015 at Unknown time  . pantoprazole (PROTONIX) 40 MG tablet Take one tablet by mouth twice daily for reflux   07/07/2015 at Unknown time  . polyethylene glycol (MIRALAX / GLYCOLAX) packet Mix powder in 4-8 oz of liquid once daily for constipation   07/06/2015 at Unknown time  . sulfaDIAZINE 500 MG tablet Take two tablets by mouth twice daily with H2O for IBS   07/06/2015 at Unknown time  . traZODone (DESYREL) 50 MG  tablet Take 1/2 tablet by mouth every night at bedtime for insomnia   07/06/2015 at Unknown time  . vitamin B-12 100 MCG tablet Take 1 tablet (100 mcg total) by mouth daily. 30 tablet 0 07/06/2015 at Unknown time  . zinc oxide (BALMEX) 11.3 % CREA cream Apply cream topically to groin and buttock every shift as preventative care   07/06/2015 at Unknown time    Assessment: 80 y.o. F presents from [facility] with R sided facial droop and lethargy - facial droop gone by the time EMS arrived. Being treated for sepsis. Pt now with positive troponins - up to 1.23. To begin heparin for r/o ACS. CBC ok on admission. Pt received SQ heparin 5000 units ~2330.   Goal of Therapy:  Heparin level  0.3-0.7 units/ml Monitor platelets by anticoagulation protocol: Yes   Plan: D/c SQ heparin  Start heparin gtt at 700 units/hr Will f/u heparin level in 8 hours Daily heparin level and CBC  Sherlon Handing, PharmD, BCPS Clinical pharmacist, pager [phone] 07/08/2015,3:04 AM

## 2015-07-08 NOTE — Evaluation (Signed)
Clinical/Bedside Swallow Evaluation Patient Details  Name: [name] MRN: [id number] Date of Birth: [birth year]  Today's Date: 07/08/2015 Time: SLP Start Time (ACUTE ONLY): [id number] SLP Stop Time (ACUTE ONLY): 0845 SLP Time Calculation (min) (ACUTE ONLY): 16 min  Past Medical History:  Past Medical History  Diagnosis Date  . Hypertension   . Diabetes mellitus   . GERD (gastroesophageal reflux disease)   . Neuromuscular disorder ([place])   . Multiple sclerosis ([place])   . Anxiety   . Hyperlipemia   . Pneumonia   . Colitis   . Unable to ambulate   . GI bleed 09/07/2012  . Type II or unspecified type diabetes mellitus without mention of complication, uncontrolled 02/03/2013  . Ulcerative colitis, unspecified 02/03/2013  . Chronic diastolic CHF (congestive heart failure) [facility])    Past Surgical History:  Past Surgical History  Procedure Laterality Date  . Tubal ligation    . Flexible sigmoidoscopy  10/05/2011    Procedure: FLEXIBLE SIGMOIDOSCOPY;  Surgeon: Winfield Cunas., MD;  Location: [facility];  Service: Endoscopy;  Laterality: N/A;   pt. coming from [place] daughter phone no. [phone] might come with her  sedation is needed, pt. is paraplegic  . Esophagogastroduodenoscopy N/A 09/09/2012    Procedure: ESOPHAGOGASTRODUODENOSCOPY (EGD);  Surgeon: Winfield Cunas., MD;  Location: [facility] ENDOSCOPY;  Service: Endoscopy;  Laterality: N/A;  . Colonoscopy N/A 09/09/2012    Procedure: COLONOSCOPY;  Surgeon: Winfield Cunas., MD;  Location: [facility] END[facility]COPY;  Service: Endoscopy;  Laterality: N/A;  possible flex sig   HPI:  80 y.o. female with PMH of hypertension, diabetes mellitus, GERD, anxiety, IBD, UC, GI bleeding, severe MS with quadriplegia and difficulty speaking, who presents with productive cough and shortness of breath and questionable facial drooop from SNF. In ED, patient was found to have positive urinalysis for UTI, positive troponin. Chest x-ray showed  patchy left base airspace disease, early infection or aspiration cannot be excluded.  Previous MBS 2013 revealed pharyngeal stasis, no penetration or aspiration, Dys 3 thin liquids recommended. RN received report pt is on thick liquids at SNF although pt denies and states she drinks thin liquids.      Assessment / Plan / Recommendation Clinical Impression  Laryngeal elevation decreased on manual palpation. No indications of airway compromise. Pt's MS diagnosis, observation of clinical weakness and RN report of modified diet at SNF warrant MBS to fully assess oropharyngeal swallow function.      Aspiration Risk  Severe aspiration risk    Diet Recommendation NPO        Other  Recommendations Oral Care Recommendations: Oral care QID   Follow up Recommendations  Skilled Nursing facility    Frequency and Duration            Prognosis        Swallow Study   General HPI: 80 y.o. female with PMH of hypertension, diabetes mellitus, GERD, anxiety, IBD, UC, GI bleeding, severe MS with quadriplegia and difficulty speaking, who presents with productive cough and shortness of breath and questionable facial drooop from SNF. In ED, patient was found to have positive urinalysis for UTI, positive troponin. Chest x-ray showed patchy left base airspace disease, early infection or aspiration cannot be excluded.  Previous MBS 2013 revealed pharyngeal stasis, no penetration or aspiration, Dys 3 thin liquids recommended. RN received report pt is on thick liquids at SNF although pt denies and states she drinks thin liquids.    Type of Study: Bedside Swallow  Evaluation Previous Swallow Assessment:  (see HPI) Diet Prior to this Study: NPO Temperature Spikes Noted: No Respiratory Status: Nasal cannula History of Recent Intubation: No Behavior/Cognition: Alert;Cooperative;Pleasant mood Oral Cavity Assessment: Dry Oral Care Completed by SLP: Yes Oral Cavity - Dentition: Edentulous (dentures not  present) Self-Feeding Abilities: Total assist (quadraplegic) Patient Positioning: Upright in bed Baseline Vocal Quality: Low vocal intensity Volitional Cough:  (attempts to cough unsuccessful) Volitional Swallow: Able to elicit (decreased laryngeal elevation)    Oral/Motor/Sensory Function Overall Oral Motor/Sensory Function: Within functional limits   Ice Chips Ice chips: Impaired Presentation: Spoon Pharyngeal Phase Impairments: Decreased hyoid-laryngeal movement   Thin Liquid Thin Liquid: Impaired Pharyngeal  Phase Impairments: Decreased hyoid-laryngeal movement    Nectar Thick Nectar Thick Liquid: Not tested   Honey Thick Honey Thick Liquid: Not tested   Puree Puree: Impaired Pharyngeal Phase Impairments: Decreased hyoid-laryngeal movement   Solid Solid:  (expectorated, uses dentures for solids)       Mick Sell, Orbie Pyo 07/08/2015,8:59 AM  Orbie Pyo Colvin Caroli.Ed [employer] [phone]

## 2015-07-08 NOTE — Progress Notes (Signed)
Utilization Review Completed.[name] T12/28/2016  

## 2015-07-08 NOTE — Progress Notes (Signed)
Notified that patient had change in mental status.  Requested stat head CT, ABG, and basic labs.  Asked RN to notify rapid response RN and [facility] PA to assess patient.  Also notified that patient had positive blood culture.  Added repeat blood culture to tomorrow's labs and will continue vancomycin.

## 2015-07-08 NOTE — Progress Notes (Addendum)
CRITICAL TROPONIN 1.23 TEXT TO [facility] NP [id number]--SCHORR NP CALLED AND REQUESTS TO CALL DR Blaine Hamper

## 2015-07-08 NOTE — Progress Notes (Signed)
OT / PT DISCHARGE    Pt is from [facility] with total (A) for all care, bed bound, hoyer lift when OOB but facility reports patient remains in bed 98% of the time. Pt has been followed from OT for splint management for contractures previously. Pt is not appropriate for acute rehab at this time. Recommend return to [facility] where patient is a full time resident per the facility medical records staff at [facility].    Jeri Modena   OTR/L Pager: [phone] Office: [phone] .

## 2015-07-08 NOTE — Progress Notes (Signed)
  Echocardiogram 2D Echocardiogram has been performed.  [name] 07/08/2015, 1:13 PM

## 2015-07-08 NOTE — Progress Notes (Addendum)
Called per floor RN at 1900 for Pt with acute change of LOC. Per RN Pt unable to arouse with sternal rub, VSS no respiratory distress. Advised to notify [facility] MD while RRT en route. Pt seen at 1915, lethargic but awakens to voice. Oriented x3 denies pain. Unable to move extremities, hx of severe MS and Quadriplegia.  CBG obtained 107. No respiratory distress, Po2 94-96% on 2 LNC VSS. Dr. Sheran Fava updated per floor RN, ABG and CTA ordered. [name] at bedside to assess Patient. Patient awake oriented x 3, for head CT only. ABG drawn and Pt left resting in bed in no distress. RN to monitor closely and escort Pt to CT scan. ABG results to be called to [facility] NP.

## 2015-07-08 NOTE — Progress Notes (Signed)
ANTICOAGULATION CONSULT NOTE - Follow Up Consult  Pharmacy Consult:  Heparin Indication: chest pain/ACS  Allergies  Allergen Reactions  . Contrast Media [Iodinated Diagnostic Agents]     Unknown    Patient Measurements: Weight: 124 lb (56.246 kg) Heparin Dosing Weight: 56 kg  Vital Signs: Temp: 98 F (36.7 C) (12/28 1950) Temp Source: Oral (12/28 1950) BP: 114/52 mmHg (12/28 1950) Pulse Rate: 84 (12/28 1950)  Labs:  Recent Labs  07/07/15 1805 07/07/15 2049 07/08/15 0309 07/08/15 0720 07/08/15 1105  HGB 14.1  --   --   --   --   HCT 49.5*  --   --   --   --   PLT PLATELET CLUMPS NOTED ON SMEAR, COUNT APPEARS ADEQUATE  --   --   --   --   APTT  --  33  --   --   --   LABPROT  --  15.8*  --   --   --   INR  --  1.24  --   --   --   HEPARINUNFRC  --   --   --   --  0.27*  CREATININE 0.46  --   --  0.33*  --   TROPONINI  --  1.23* 1.12* 0.99*  --     Estimated Creatinine Clearance: 50 mL/min (by C-G formula based on Cr of 0.33).      Assessment: 89 YOF with history of GIB in 2014 to continue on IV heparin fo possible ACS.   -Heparin was stopped for about 45 minutes due to concern of hemorrhagic stroke and CT negative. Heparin has now been resumed at the previous rate   Goal of Therapy:  Heparin level 0.3-0.7 units/ml Monitor platelets by anticoagulation protocol: Yes    Plan:  -Continue heparin at 800 units/hr -Heparin level in 8 hrs and daily with CBC daily.  Hildred Laser, Pharm D 07/08/2015 9:26 PM

## 2015-07-08 NOTE — Progress Notes (Signed)
[facility] PROGRESS NOTE  [name] [id number] DOB: 18-Aug-1934 DOA: 07/07/2015 PCP: Blanchie Serve, MD  Brief Summary  [name] is a 80 y.o. female with PMH of hypertension, diabetes mellitus, GERD, anxiety, IBD, UC, GI bleeding, severe MS with quadriplegia and difficulty speaking, who presented with productive cough and shortness of breath.  Patient is from a nursing home.  She was brought initially because of possible right facial droop. Per report, pt was last seen normal at 1400. Staff rounded at 1530 and pt was noticed to have possible right sided facial droop and seemed lethargic. Upon arrival to ED, pt did not have facial droop, but had shortness of breath and productive cough. She was hypotensive to 85/54, which improved to 117/72 after IV fluid. Patient denied symptoms for UTI, abdominal pain, diarrhea. No chest pain currently.  In ED, Urinalysis was consistent with UTI, positive troponin 0.31-->1.23, lactate 1.8, WBC 8.7, temperature normal, tachycardia, tachypnea, oxygen desaturations to 70% on room air. Chest x-ray showed possible patchy left base airspace disease. Patient submitted 3 inpatient for further eval and treatment.  Assessment/Plan  Acute respiratory failure with hypoxia and hypercarbia ([place]): this is likely due to HCAP vs. aspiration pneumonia.   - Continue IV Vancomycin and Zosyn (pt was listed as allergy to penicillin, but family denied that this allergy per pharmacy) - Mucinex for cough  - Xopenex Neb prn for SOB - Urine legionella and S. pneumococcal antigen - Follow up blood culture x2, sputum culture  - NPO until SLP done  UTI:  -on Zosyn as above -Follow up blood culture and urine culture  Sepsis 2/2 UTI and PNA: Patient meets criteria for sepsis with tachycardia, tachypnea and hypotension. Blood pressure responded to IV fluids. Currently hemodynamically stable. - lactic acid levels wnl - decrease IVF -On IV Abx as above  NSTMI: trop  0.31-->1.23 --> 1.12. Pt denies chest pain. This may be due to demanding ischemia secondary to sepsis. - EKG appears stable - Nitroglycerin, Morphine, metoprolol, and lipitor  - continue ASA 81 mg  - monitor for GI bleeding > had iron deficiency attributed to hemorrhoids but no gross blood per rectum 3 years ago - Risk factor stratification: FLP and A1C  - 2d echo - Cardiology paged, awaiting call back  Chronic diastolic congestive heart failure: 2-D echo on 09/04/09 showed a year 70-75% with grade 1 diastolic dysfunction. BNP 36.9. CHF is compensated on admission. -Continue metoprolol  MS (multiple sclerosis) ([place]): No acute issues. -Observe closely  GERD: -Protonix  GERD: -Protonix IV  HTN:  -On metoprolol -Hold Lisinopril -PRN hydralazine IV  Ulcerative colitis (HCC) and IBS: stable -continue sulfadiazine  DM-II: Last A1c 6.9 on 02/26/15, well controled. Patient is taking metformin and Tradjenta at home -SSI > change to q4h while NPO -Check A1c   Diet:  NPO Access:  PIV IVF:  yes Proph:  Heparin gtt  Code Status: full Family Communication: patient alone Disposition Plan: pending results of urine culture, ECHO, hemodynamically stable for at least 24 hours.  Anticipate back to SNF in a few days   Consultants:  Cardiology  Procedures:  ECHO pending  Antibiotics:  vanc and zosyn 12/27   HPI/Subjective:  States she feels back to normal and denies cough, SOB, chest pain, nausea.      Objective: Filed Vitals:   07/08/15 0500 07/08/15 0600 07/08/15 0731 07/08/15 0754  BP: 94/50 103/50  106/53  Pulse: 98 106  113  Temp:    98.5 F (36.9  C)  TempSrc:    Oral  Resp: 13 22  26   Weight:      SpO2: 96% 90% 94% 91%    Intake/Output Summary (Last 24 hours) at 07/08/15 0814 Last data filed at 07/07/15 1858  Gross per 24 hour  Intake    100 ml  Output      0 ml  Net    100 ml   Filed Weights   07/07/15 1752  Weight: 56.246 kg (124 lb)   Body mass  index is 21.27 kg/(m^2).  Exam:   General:  Adult female, No acute distress, asleep but arousable  HEENT:  NCAT, dry tongue  Cardiovascular:  Tachycardic, RR, nl S1, S2 no mrg, 2+ pulses, warm extremities  Respiratory:  rhonchorous breath sounds and possible rales at the right base, no wheezes, no increased WOB  Abdomen:   NABS, soft, NT/ND  MSK:   Decreased tone and severely decreased bulk, 1+ pitting bilateral LEE  Neuro:  quadriplegia  Data Reviewed: Basic Metabolic Panel:  Recent Labs Lab 07/07/15 1805 07/08/15 0720  NA 145 142  K 4.7 4.1  CL 105 106  CO2 31 29  GLUCOSE 154* 122*  BUN 12 9  CREATININE 0.46 0.33*  CALCIUM 10.0 8.8*   Liver Function Tests:  Recent Labs Lab 07/07/15 1805  AST 23  ALT 11*  ALKPHOS 63  BILITOT 0.1*  PROT 7.6  ALBUMIN 3.5   No results for input(s): LIPASE, AMYLASE in the last 168 hours. No results for input(s): AMMONIA in the last 168 hours. CBC:  Recent Labs Lab 07/07/15 1805  WBC 8.7  NEUTROABS 5.2  HGB 14.1  HCT 49.5*  MCV 83.1  PLT PLATELET CLUMPS NOTED ON SMEAR, COUNT APPEARS ADEQUATE    Recent Results (from the past 240 hour(s))  MRSA PCR Screening     Status: None   Collection Time: 07/07/15 10:39 PM  Result Value Ref Range Status   MRSA by PCR NEGATIVE NEGATIVE Final    Comment:        The GeneXpert MRSA Assay (FDA approved for NASAL specimens only), is one component of a comprehensive MRSA colonization surveillance program. It is not intended to diagnose MRSA infection nor to guide or monitor treatment for MRSA infections.   Culture, sputum-assessment     Status: None   Collection Time: 07/07/15 11:50 PM  Result Value Ref Range Status   Specimen Description EXPECTORATED SPUTUM  Final   Special Requests NONE  Final   Sputum evaluation   Final    MICROSCOPIC FINDINGS SUGGEST THAT THIS SPECIMEN IS NOT REPRESENTATIVE OF LOWER RESPIRATORY SECRETIONS. PLEASE RECOLLECT. Gram Stain Report Called  to,Read Back By and Verified With: DENISE @0209  07/08/15 MKELLY    Report Status 07/08/2015 FINAL  Final     Studies: Dg Chest Port 1 View  07/07/2015  CLINICAL DATA:  Right-sided facial droop and lethargy.  Hypoxia. EXAM: PORTABLE CHEST 1 VIEW COMPARISON:  09/07/2012 FINDINGS: Midline trachea. Cardiomegaly accentuated by AP portable technique. Atherosclerosis in the transverse aorta. No pleural effusion or pneumothorax. Low lung volumes with resultant pulmonary interstitial prominence. Patchy left base airspace disease IMPRESSION: Low lung volumes. Patchy left base airspace disease is favored to represent atelectasis superimposed upon scarring. Early infection or aspiration cannot be excluded. Cardiomegaly with aortic atherosclerosis. Electronically Signed   By: Abigail Miyamoto M.D.   On: 07/07/2015 18:42    Scheduled Meds: . sodium chloride   Intravenous STAT  . aspirin EC  81 mg  Oral Daily  . atorvastatin  40 mg Oral q1800  . gabapentin  100 mg Oral TID  . guaiFENesin  600 mg Oral BID  . insulin aspart  0-9 Units Subcutaneous TID WC  . levalbuterol  1.25 mg Nebulization Q6H  . loratadine  10 mg Oral Daily  . LORazepam      . LORazepam  0.5 mg Intravenous Once  . metoprolol tartrate  12.5 mg Oral Daily  . pantoprazole (PROTONIX) IV  40 mg Intravenous Q24H  . sulfaDIAZINE  1,000 mg Oral Q12H  . vitamin B-12  100 mcg Oral Daily  . zinc oxide   Topical Daily   Continuous Infusions: . heparin 700 Units/hr (07/08/15 0335)    Principal Problem:   Acute respiratory failure with hypoxia and hypercarbia (HCC) Active Problems:   MS (multiple sclerosis) (HCC)   Ulcerative colitis (HCC)   GERD (gastroesophageal reflux disease)   Essential hypertension, benign   Anxiety state   Diabetes mellitus type II, controlled, with no complications (HCC)   UTI (lower urinary tract infection)   NSTEMI (non-ST elevated myocardial infarction) (HCC)   Chronic diastolic CHF (congestive heart failure)  ([place])   Sepsis ([place])   Aspiration pneumonia ([place])   HCAP (healthcare-associated pneumonia)    Time spent: 30 min    Fawaz Borquez, [facility] Pager [phone]. If 7PM-7AM, please contact night-coverage at [email or web address], password [facility] 07/08/2015, 8:14 AM  LOS: 1 day

## 2015-07-08 NOTE — Progress Notes (Signed)
CRITICAL VALUE ALERT  Critical value received:  Positive Anerobic gram positive cocci and clusters   Date of notification:  07/08/2015   Time of notification:  7:26 PM   Critical value read back:Yes.    Nurse who received alert:  Paschal Dopp, RN   MD notified (1st page):  Short, MD  Time of first page:  7:27 PM   MD notified (2nd page):  Time of second page:  Responding MD:   Time MD responded:

## 2015-07-08 NOTE — Progress Notes (Signed)
Speech Language Pathology Patient Details Name: [name] MRN: [id number] DOB: 1935/04/26 Today's Date: 07/08/2015 Time: 1000-1017 SLP Time Calculation (min) (ACUTE ONLY): 17 min    MBSS complete. Full report located under chart review in imaging section. DG swallow function.  Recommend:  CHL IP CLINICAL IMPRESSIONS 07/08/2015  Therapy Diagnosis --  Clinical Impression Decreased oral cohesion and mildly delayed transit due to weakness. Mod-severe sensory>motor pharyngeal dysphagia. Pt lacks head control therefore swallow function assessed in both flexed and extended (more supine) positions. Most swallows were initiated at the level of the pyriform sinuses due to decreased sensation. Laryngeal penetration, flash with initial trial thin and silently aspirated in head extension and semi upright position using straw with thin. Decreased cervical esophageal pressures led to barium backflow/residuals to pyriform sinuses. Aspiration is significantly increased as pt is a dependent feeder and dependent on staff for proper head and trunk positioning. SLP recommends Dys 1 diet (due to dentures not present) and nectar thick liquids, upright as possible and head in neutral, small sips, straw allowed and pills whole in applesauce.     Impact on safety and function Severe aspiration risk     Houston Siren M.Ed [employer] [phone]

## 2015-07-08 NOTE — Progress Notes (Signed)
NP made aware that pt was unable to tolerate Bipap, pt placed back on 3L [place], not in distress. Sats 94%

## 2015-07-09 ENCOUNTER — Inpatient Hospital Stay (HOSPITAL_COMMUNITY): Payer: Medicare Other

## 2015-07-09 DIAGNOSIS — J9602 Acute respiratory failure with hypercapnia: Secondary | ICD-10-CM

## 2015-07-09 DIAGNOSIS — J189 Pneumonia, unspecified organism: Secondary | ICD-10-CM

## 2015-07-09 DIAGNOSIS — R7989 Other specified abnormal findings of blood chemistry: Secondary | ICD-10-CM

## 2015-07-09 DIAGNOSIS — I5032 Chronic diastolic (congestive) heart failure: Secondary | ICD-10-CM

## 2015-07-09 DIAGNOSIS — J9601 Acute respiratory failure with hypoxia: Secondary | ICD-10-CM

## 2015-07-09 LAB — GLUCOSE, CAPILLARY
GLUCOSE-CAPILLARY: 101 mg/dL — AB (ref 65–99)
GLUCOSE-CAPILLARY: 98 mg/dL (ref 65–99)
GLUCOSE-CAPILLARY: 139 mg/dL — AB (ref 65–99)
GLUCOSE-CAPILLARY: 209 mg/dL — AB (ref 65–99)
Glucose-Capillary: 129 mg/dL — ABNORMAL HIGH (ref 65–99)
Glucose-Capillary: 85 mg/dL (ref 65–99)

## 2015-07-09 LAB — BLOOD GAS, ARTERIAL
Acid-Base Excess: 0.8 mmol/L (ref 0.0–2.0)
Acid-Base Excess: 2.4 mmol/L — ABNORMAL HIGH (ref 0.0–2.0)
Bicarbonate: 26.7 mEq/L — ABNORMAL HIGH (ref 20.0–24.0)
Bicarbonate: 28.3 mEq/L — ABNORMAL HIGH (ref 20.0–24.0)
Drawn by: 36496
Drawn by: 365291
FIO2: 0.36
O2 Content: 3 L/min
O2 Saturation: 88.3 %
O2 Saturation: 91.1 %
PATIENT TEMPERATURE: 98.6
PCO2 ART: 60.3 mmHg — AB (ref 35.0–45.0)
PH ART: 7.293 — AB (ref 7.350–7.450)
PO2 ART: 58.5 mmHg — AB (ref 80.0–100.0)
Patient temperature: 98.6
TCO2: 28.4 mmol/L (ref 0–100)
TCO2: 30.2 mmol/L (ref 0–100)
pCO2 arterial: 57.1 mmHg (ref 35.0–45.0)
pH, Arterial: 7.291 — ABNORMAL LOW (ref 7.350–7.450)
pO2, Arterial: 66.1 mmHg — ABNORMAL LOW (ref 80.0–100.0)

## 2015-07-09 LAB — CBC
HEMATOCRIT: 37.7 % (ref 36.0–46.0)
HEMOGLOBIN: 10.6 g/dL — AB (ref 12.0–15.0)
MCH: 23.6 pg — AB (ref 26.0–34.0)
MCHC: 28.1 g/dL — AB (ref 30.0–36.0)
MCV: 84 fL (ref 78.0–100.0)
Platelets: 243 10*3/uL (ref 150–400)
RBC: 4.49 MIL/uL (ref 3.87–5.11)
RDW: 27.3 % — ABNORMAL HIGH (ref 11.5–15.5)
WBC: 6.9 10*3/uL (ref 4.0–10.5)

## 2015-07-09 LAB — BASIC METABOLIC PANEL
ANION GAP: 14 (ref 5–15)
BUN: 8 mg/dL (ref 6–20)
CALCIUM: 9.1 mg/dL (ref 8.9–10.3)
CO2: 21 mmol/L — ABNORMAL LOW (ref 22–32)
CREATININE: 0.36 mg/dL — AB (ref 0.44–1.00)
Chloride: 106 mmol/L (ref 101–111)
GLUCOSE: 107 mg/dL — AB (ref 65–99)
Potassium: 4.3 mmol/L (ref 3.5–5.1)
SODIUM: 141 mmol/L (ref 135–145)

## 2015-07-09 LAB — LEGIONELLA ANTIGEN, URINE

## 2015-07-09 LAB — HEMOGLOBIN A1C
Hgb A1c MFr Bld: 6 % — ABNORMAL HIGH (ref 4.8–5.6)
MEAN PLASMA GLUCOSE: 126 mg/dL

## 2015-07-09 LAB — HEPARIN LEVEL (UNFRACTIONATED): Heparin Unfractionated: 0.26 IU/mL — ABNORMAL LOW (ref 0.30–0.70)

## 2015-07-09 MED ORDER — PANTOPRAZOLE SODIUM 40 MG PO PACK
40.0000 mg | PACK | Freq: Every day | ORAL | Status: DC
  Administered 2015-07-09 – 2015-07-11 (×3): 40 mg
  Filled 2015-07-09 (×3): qty 20

## 2015-07-09 MED ORDER — LEVALBUTEROL HCL 1.25 MG/0.5ML IN NEBU
1.2500 mg | INHALATION_SOLUTION | Freq: Three times a day (TID) | RESPIRATORY_TRACT | Status: DC
  Administered 2015-07-10 – 2015-07-11 (×4): 1.25 mg via RESPIRATORY_TRACT
  Filled 2015-07-09 (×4): qty 0.5

## 2015-07-09 MED ORDER — TECHNETIUM TC 99M DIETHYLENETRIAME-PENTAACETIC ACID: 30.0000 | Freq: Once | INTRAVENOUS | Status: DC | PRN

## 2015-07-09 MED ORDER — LEVALBUTEROL HCL 0.63 MG/3ML IN NEBU
0.6300 mg | INHALATION_SOLUTION | Freq: Four times a day (QID) | RESPIRATORY_TRACT | Status: DC | PRN
Start: 2015-07-09 — End: 2015-07-11

## 2015-07-09 MED ORDER — MORPHINE SULFATE (PF) 2 MG/ML IV SOLN
1.0000 mg | Freq: Once | INTRAVENOUS | Status: AC
  Administered 2015-07-09: 2 mg via INTRAVENOUS
  Filled 2015-07-09: qty 1

## 2015-07-09 MED ORDER — TECHNETIUM TO 99M ALBUMIN AGGREGATED
4.0000 | Freq: Once | INTRAVENOUS | Status: AC | PRN
  Administered 2015-07-09: 4 via INTRAVENOUS

## 2015-07-09 MED ORDER — ENOXAPARIN SODIUM 40 MG/0.4ML ~~LOC~~ SOLN
40.0000 mg | SUBCUTANEOUS | Status: DC
  Administered 2015-07-09 – 2015-07-10 (×2): 40 mg via SUBCUTANEOUS
  Filled 2015-07-09 (×2): qty 0.4

## 2015-07-09 MED ORDER — ACETAMINOPHEN 325 MG PO TABS
650.0000 mg | ORAL_TABLET | Freq: Four times a day (QID) | ORAL | Status: DC | PRN
  Administered 2015-07-09 (×2): 650 mg via ORAL
  Filled 2015-07-09 (×2): qty 2

## 2015-07-09 NOTE — Progress Notes (Signed)
CRITICAL VALUE ALERT  Critical value received:  PH 7.29, PaO2 57.1, PO2 66.1, bicarb 26.7  Date of notification:  07/09/15  Time of notification:  0805  Critical value read back:YES  Nurse who received alert:  Paschal Dopp  MD notified (1st page):  Dr Sheran Fava  Time of first page:  0805  MD notified (2nd page):  Time of second page:  Responding MD:  Dr. Sheran Fava  Time MD responded:  [phone]

## 2015-07-09 NOTE — Progress Notes (Signed)
ABG results called to NP, pt is now on 4L [place] sats are 86%, pt is a+o x 4, no complaints of SOB or chest pain.

## 2015-07-09 NOTE — Progress Notes (Signed)
Speech Language Pathology Treatment: Dysphagia  Patient Details Name: ONESHIA Heath MRN: [id number] DOB: 1935/05/18 Today's Date: 07/09/2015 Time: [id number] SLP Time Calculation (min) (ACUTE ONLY): 9 min  Assessment / Plan / Recommendation Clinical Impression  Upon SLP arrival, pt calling out for help due to need to "spit" with moderate amounts of thick secretions pooled in her anterior sulcus. SLP provided more upright positioning and Total A for use of oral suction to clear. Pt asked for a towel to be draped across her shoulders so she could continue to expectorate saliva. When asked if she could swallow, pt said that she did not think that she should. Encouraged more independent management of secretions as able.  Pt was agreeable to trials of nectar thick liquids, although politely declined solids. Mod cues for appropriate pacing appeared to reduce aspiration risk, as immediate cough was observed x1 following larger sip from straw. Pt will continue to need full supervision for safety and assistance with feeding. SLP provided review of results from [facility] on previous date, including need to remain upright following PO intake due to the concern for backflow. Will continue to follow.    HPI HPI: 80 y.o. female with PMH of hypertension, diabetes mellitus, GERD, anxiety, IBD, UC, GI bleeding, severe MS with quadriplegia and difficulty speaking, who presents with productive cough and shortness of breath and questionable facial drooop from SNF. In ED, patient was found to have positive urinalysis for UTI, positive troponin. Chest x-ray showed patchy left base airspace disease, early infection or aspiration cannot be excluded.  Previous MBS 2013 revealed pharyngeal stasis, no penetration or aspiration, Dys 3 thin liquids recommended. RN received report pt is on thick liquids at SNF although pt denies and states she drinks thin liquids. Difficult to assess safety with thin liquids with sole bedside swallow.        SLP Plan  Continue with current plan of care     Recommendations  Diet recommendations: Dysphagia 1 (puree);Nectar-thick liquid Liquids provided via: Cup;Straw Medication Administration: Crushed with puree Supervision: Staff to assist with self feeding;Full supervision/cueing for compensatory strategies Compensations: Slow rate;Small sips/bites Postural Changes and/or Swallow Maneuvers: Seated upright 90 degrees;Upright 30-60 min after meal       Oral Care Recommendations: Oral care BID Follow up Recommendations: Skilled Nursing facility Plan: Continue with current plan of care   [name], M.A. CCC-SLP [phone]  [name] 07/09/2015, 9:18 AM

## 2015-07-09 NOTE — NC FL2 (Signed)
[place] MEDICAID FL2 LEVEL OF CARE SCREENING TOOL     IDENTIFICATION  Patient Name: [name] Birthdate: 1935/01/18 Sex: female Admission Date (Current Location): 07/07/2015  [facility] and [place] Number:  Herbalist and Address:  [facility] [place] [address] [place], [place] [address]      Provider Number: [id number]  Attending Physician Name and Address:  Janece Canterbury, MD  Relative Name and Phone Number:  Maudie Mercury, daughter, [phone]    Current Level of Care: Hospital Recommended Level of Care: [place] Prior Approval Number:    Date Approved/Denied:   PASRR Number:    Discharge Plan: SNF    Current Diagnoses: Patient Active Problem List   Diagnosis Date Noted  . UTI (lower urinary tract infection) 07/07/2015  . NSTEMI (non-ST elevated myocardial infarction) ([place]) 07/07/2015  . Sepsis ([place]) 07/07/2015  . Aspiration pneumonia ([place]) 07/07/2015  . HCAP (healthcare-associated pneumonia) 07/07/2015  . Acute respiratory failure with hypoxia and hypercarbia ([place]) 07/07/2015  . Chronic diastolic CHF (congestive heart failure) ([place])   . IBD (inflammatory bowel disease) 01/01/2015  . Controlled type 2 DM with microalbuminuria or microproteinuria 01/01/2015  . Esophageal reflux 01/01/2015  . Benign paroxysmal positional vertigo 07/21/2014  . DM (diabetes mellitus) type II controlled with renal manifestation ([place]) 07/21/2014  . Vertigo 05/12/2014  . Diabetes mellitus type II, controlled, with no complications ([place]) [id number]  . Cough 08/12/2013  . Trigeminal neuralgia pain 08/12/2013  . Vitamin D deficiency 07/11/2013  . Bilateral dry eyes 07/11/2013  . Ulcerative colitis ([place]) 02/03/2013  . GERD (gastroesophageal reflux disease) 02/03/2013  . Constipation 02/03/2013  . Essential hypertension, benign 02/03/2013  . Dyslipidemia 02/03/2013  . Allergic rhinitis 02/03/2013  . Anxiety state 02/03/2013  . Anemia  11/19/2011  . Hypokalemia 11/17/2011  . Quadriplegia ([place]) 11/16/2011  . MS (multiple sclerosis) ([place]) 11/16/2011    Orientation RESPIRATION BLADDER Height & Weight    Self, Time, Situation, Place  O2 (Nasal Cannula) Incontinent   124 lbs.  BEHAVIORAL SYMPTOMS/MOOD NEUROLOGICAL BOWEL NUTRITION STATUS      Incontinent, Continent  (See DC Summary)  AMBULATORY STATUS COMMUNICATION OF NEEDS Skin   Extensive Assist Verbally Normal                       Personal Care Assistance Level of Assistance  Bathing, Feeding, Dressing Bathing Assistance: Maximum assistance Feeding assistance: Maximum assistance Dressing Assistance: Maximum assistance     Functional Limitations Info             SPECIAL CARE FACTORS FREQUENCY                       Contractures      Additional Factors Info  Code Status, Allergies, Insulin Sliding Scale Code Status Info: DNR Allergies Info: Contrast Media   Insulin Sliding Scale Info: Every 4 hours       Current Medications (07/09/2015):  This is the current hospital active medication list Current Facility-Administered Medications  Medication Dose Route Frequency Provider Last Rate Last Dose  . acetaminophen (TYLENOL) tablet 650 mg  650 mg Oral Q6H PRN Janece Canterbury, MD   650 mg at 07/09/15 1352  . aspirin EC tablet 81 mg  81 mg Oral Daily Ivor Costa, MD   81 mg at 07/09/15 [id number]  . atorvastatin (LIPITOR) tablet 40 mg  40 mg Oral q1800 Ivor Costa, MD   40 mg at 07/09/15 1739  .  enoxaparin (LOVENOX) injection 40 mg  40 mg Subcutaneous Q24H Thuy D Dang, RPH      . guaiFENesin (MUCINEX) 12 hr tablet 600 mg  600 mg Oral BID Ivor Costa, MD   600 mg at 07/09/15 [id number]  . hydrALAZINE (APRESOLINE) injection 5 mg  5 mg Intravenous Q2H PRN Ivor Costa, MD      . insulin aspart (novoLOG) injection 0-9 Units  0-9 Units Subcutaneous Q4H Janece Canterbury, MD   3 Units at 07/09/15 1739  . levalbuterol (XOPENEX) nebulizer solution 1.25 mg  1.25 mg  Nebulization Q6H Ivor Costa, MD   1.25 mg at 07/09/15 0754  . loratadine (CLARITIN) tablet 10 mg  10 mg Oral Daily Ivor Costa, MD   10 mg at 07/09/15 [id number]  . metoprolol tartrate (LOPRESSOR) tablet 12.5 mg  12.5 mg Oral Daily Ivor Costa, MD   12.5 mg at 07/09/15 [id number]  . nitroGLYCERIN (NITROSTAT) SL tablet 0.4 mg  0.4 mg Sublingual Q5 min PRN Ivor Costa, MD      . ondansetron [facility]) injection 4 mg  4 mg Intravenous Q6H PRN Janece Canterbury, MD      . pantoprazole sodium (PROTONIX) 40 mg/20 mL oral suspension 40 mg  40 mg Per Tube Daily Janece Canterbury, MD   40 mg at 07/09/15 [id number]  . piperacillin-tazobactam (ZOSYN) IVPB 3.375 g  3.375 g Intravenous Q8H Rebecka Apley, RPH   3.375 g at 07/09/15 1300  . polyethylene glycol (MIRALAX / GLYCOLAX) packet 17 g  17 g Oral Daily PRN Ivor Costa, MD      . RESOURCE THICKENUP CLEAR   Oral PRN Janece Canterbury, MD      . sulfaDIAZINE tablet 1,000 mg  1,000 mg Oral Q12H Ivor Costa, MD   1,000 mg at 07/09/15 [id number]  . technetium TC 46M diethylenetriame-pentaacetic acid (DTPA) injection 30 milli [place]  30 milli [place]ie Inhalation Once PRN Janece Canterbury, MD      . vancomycin (VANCOCIN) 500 mg in sodium chloride 0.9 % 100 mL IVPB  500 mg Intravenous Q12H Rebecka Apley, RPH   500 mg at 07/09/15 1245  . vitamin B-12 (CYANOCOBALAMIN) tablet 100 mcg  100 mcg Oral Daily Ivor Costa, MD   100 mcg at 07/09/15 [id number]  . zinc oxide (BALMEX) 11.3 % cream   Topical Daily Ivor Costa, MD   1 application at [id number]     Discharge Medications: Please see discharge summary for a list of discharge medications.  Relevant Imaging Results:  Relevant Lab Results:   Additional [place] Emmalyn Hinson, LCSWA

## 2015-07-09 NOTE — Progress Notes (Signed)
[facility] PROGRESS NOTE  [name] [id number] DOB: 01/19/1935 DOA: 07/07/2015 PCP: Blanchie Serve, MD  Brief Summary  [name] is a 80 y.o. female with PMH of hypertension, diabetes mellitus, GERD, anxiety, IBD, UC, GI bleeding, severe MS with quadriplegia and difficulty speaking, who presented with productive cough and shortness of breath.  Patient is from a nursing home.  She was brought initially because of possible right facial droop. Per report, pt was last seen normal at 1400. Staff rounded at 1530 and pt was noticed to have possible right sided facial droop and seemed lethargic. Upon arrival to ED, pt did not have facial droop, but had shortness of breath and productive cough. She was hypotensive to 85/54, which improved to 117/72 after IV fluid. Patient denied symptoms for UTI, abdominal pain, diarrhea. No chest pain currently.  In ED, Urinalysis was consistent with UTI, positive troponin 0.31-->1.23, lactate 1.8, WBC 8.7, temperature normal, tachycardia, tachypnea, oxygen desaturations to 70% on room air. Chest x-ray showed possible patchy left base airspace disease. Patient submitted 3 inpatient for further eval and treatment.  Assessment/Plan  Acute respiratory failure with hypoxia and hypercarbia ([place]): this is likely due to HCAP vs. aspiration pneumonia.  I suspect she may have some progression of her MS.  - Continue IV Vancomycin and Zosyn (pt was listed as allergy to penicillin, but family denied that this allergy per pharmacy) - Mucinex for cough  - Xopenex Neb prn for SOB - Urine legionella and S. pneumococcal antigen both negative -  Sputum inadequate sample -  VQ scan low probability for PE  Positive blood culture -  Repeat blood culture 12/29 -  F/u speciation and sensitivities -  Continue vancomycin  E. Coli UTI, present at time of admission -on Zosyn as above -Follow up blood culture and urine culture  Sepsis 2/2 UTI and PNA: Patient meets criteria  for sepsis with tachycardia, tachypnea and hypotension. Blood pressure responded to IV fluids. Currently hemodynamically stable. - lactic acid levels wnl - decrease IVF -On IV Abx as above  NSTEMI: trop 0.31-->1.23 --> 1.12. Pt denies chest pain. This may be due to demanding ischemia secondary to sepsis. - EKG appears stable - Nitroglycerin, Morphine, metoprolol, and lipitor  - continue ASA 81 mg  - d/c heparin gtt - Risk factor stratification: FLP and A1C  - 2d echo:  60-65% EF, probable elevated end-diastolic filling pressures, no focal wall motion abnormalities - Cardiology consult appreciates  Chronic diastolic congestive heart failure: 2-D echo on 09/04/09 showed a year 70-75% with grade 1 diastolic dysfunction. BNP 36.9. CHF is compensated on admission. -Continue metoprolol  MS (multiple sclerosis) ([place]): No acute issues. -Observe closely  GERD: -Protonix  HTN:  -On metoprolol -Hold Lisinopril -PRN hydralazine IV  Ulcerative colitis (HCC) and IBS: stable -continue sulfadiazine  DM-II: Last A1c 6.9 on 02/26/15, well controled. Patient is taking metformin and Tradjenta at home -SSI > change to q4h while NPO - A1c 6  Diet:  Dysphagia 1  Access:  PIV IVF:  off Proph:  lovenox  Code Status: After lengthy discussion with patient with respiratory therapist and nurse in room, patient now DO NOT RESUSCITATE/DO NOT INTUBATE Family Communication:  Spoke with patient and then called daughter who is coming to the hospital later today. Informed daughter of conversation about CODE STATUS. She is going to discuss this with her mother when she arrives. Disposition Plan:  Anticipate back to SNF in a few days if she does not have further  episodes of hypercapneic somnolence.  I am concerned that she is having gradual progression of her MS which is causing her to have recurrent aspiration pneumonia, and recommended more comfort directed care at this  point.   Consultants:  Cardiology  Procedures:  ECHO  Antibiotics:  vanc and zosyn 12/27   HPI/Subjective:  Coughing with thick sputum.  Had an episode of unresponsiveness or somnolence last night. Rapid response was called and discovered patient was and arousable and oriented 3. CT head demonstrated no acute changes or hemorrhage. ABG was consistent with hypercapnic respiratory failure. BiPAP was applied for several hours intermittently, however the patient did not tolerate well. Repeat ABG demonstrates some improvement in her CO2 although she remains acidotic.  Her mentation improved.    Objective: Filed Vitals:   07/09/15 0045 07/09/15 0152 07/09/15 0214 07/09/15 0756  BP: 103/46 105/50 108/50   Pulse: 90 87 86 67  Temp: 98.2 F (36.8 C)     TempSrc: Oral     Resp: 21 20 16 23   Weight:      SpO2: 92% 94% 94% 99%    Intake/Output Summary (Last 24 hours) at 07/09/15 0811 Last data filed at 07/08/15 1700  Gross per 24 hour  Intake    604 ml  Output      0 ml  Net    604 ml   Filed Weights   07/07/15 1752  Weight: 56.246 kg (124 lb)   Body mass index is 21.27 kg/(m^2).  Exam:   General:  Adult female, No acute distreawake and alert:    NCAT, dry tongue  Cardiovascular:  RRR, nl S1, S2 no mrg, 2+ pulses, warm extremities  Respiratory:  rhonchorous breath sounds, coughing up thick white yellow sputum, no wheezes, no increased WOB  Abdomen:   NABS, soft, NT/ND  MSK:   Decreased tone and severely decreased bulk, 1+ pitting bilateral LEE  Neuro:  quadriplegia  Data Reviewed: Basic Metabolic Panel:  Recent Labs Lab 07/07/15 1805 07/08/15 0720  NA 145 142  K 4.7 4.1  CL 105 106  CO2 31 29  GLUCOSE 154* 122*  BUN 12 9  CREATININE 0.46 0.33*  CALCIUM 10.0 8.8*   Liver Function Tests:  Recent Labs Lab 07/07/15 1805  AST 23  ALT 11*  ALKPHOS 63  BILITOT 0.1*  PROT 7.6  ALBUMIN 3.5   No results for input(s): LIPASE, AMYLASE in the last  168 hours. No results for input(s): AMMONIA in the last 168 hours. CBC:  Recent Labs Lab 07/07/15 1805 07/09/15 0550  WBC 8.7 6.9  NEUTROABS 5.2  --   HGB 14.1 10.6*  HCT 49.5* 37.7  MCV 83.1 84.0  PLT PLATELET CLUMPS NOTED ON SMEAR, COUNT APPEARS ADEQUATE 243    Recent Results (from the past 240 hour(s))  Blood Culture (routine x 2)     Status: None (Preliminary result)   Collection Time: 07/07/15  6:05 PM  Result Value Ref Range Status   Specimen Description BLOOD LEFT HAND  Final   Special Requests BOTTLES DRAWN AEROBIC AND ANAEROBIC 5CC  Final   Culture NO GROWTH < 24 HOURS  Final   Report Status PENDING  Incomplete  Blood Culture (routine x 2)     Status: None (Preliminary result)   Collection Time: 07/07/15  6:07 PM  Result Value Ref Range Status   Specimen Description BLOOD LEFT ANTECUBITAL  Final   Special Requests BOTTLES DRAWN AEROBIC AND ANAEROBIC 5CC  Final   Culture  Setup Time   Final    GRAM POSITIVE COCCI IN CLUSTERS ANAEROBIC BOTTLE ONLY CRITICAL RESULT CALLED TO, READ BACK BY AND VERIFIED WITH: B DALTON RN [phone] 07/08/15 A BROWNING    Culture NO GROWTH < 24 HOURS  Final   Report Status PENDING  Incomplete  Urine culture     Status: None (Preliminary result)   Collection Time: 07/07/15  7:14 PM  Result Value Ref Range Status   Specimen Description URINE, CATHETERIZED  Final   Special Requests NONE  Final   Culture >=100,000 COLONIES/mL ESCHERICHIA COLI  Final   Report Status PENDING  Incomplete  MRSA PCR Screening     Status: None   Collection Time: 07/07/15 10:39 PM  Result Value Ref Range Status   MRSA by PCR NEGATIVE NEGATIVE Final    Comment:        The GeneXpert MRSA Assay (FDA approved for NASAL specimens only), is one component of a comprehensive MRSA colonization surveillance program. It is not intended to diagnose MRSA infection nor to guide or monitor treatment for MRSA infections.   Culture, sputum-assessment     Status: None    Collection Time: 07/07/15 11:50 PM  Result Value Ref Range Status   Specimen Description EXPECTORATED SPUTUM  Final   Special Requests NONE  Final   Sputum evaluation   Final    MICROSCOPIC FINDINGS SUGGEST THAT THIS SPECIMEN IS NOT REPRESENTATIVE OF LOWER RESPIRATORY SECRETIONS. PLEASE RECOLLECT. Gram Stain Report Called to,Read Back By and Verified With: DENISE @0209  07/08/15 MKELLY    Report Status 07/08/2015 FINAL  Final     Studies: Ct Head Wo Contrast  07/08/2015  CLINICAL DATA:  Acute onset of altered mental status tonight. EXAM: CT HEAD WITHOUT CONTRAST TECHNIQUE: Contiguous axial images were obtained from the base of the skull through the vertex without intravenous contrast. COMPARISON:  CT scan dated 10/29/2007 FINDINGS: No mass lesion. No midline shift. No acute hemorrhage or hematoma. No extra-axial fluid collections. No evidence of acute infarction. There is diffuse slight cerebral cortical atrophy. Slight periventricular lucency in the frontal lobes, left more than right, chronic and unchanged. There are new air-fluid levels in the sphenoid sinus. The osseous structures otherwise are unchanged with prominent chronic hyperostosis frontalis interna. IMPRESSION: 1. No acute abnormality of the brain. Slight atrophy with chronic slight white matter disease in the frontal lobes, probably small vessel ischemic change. 2. New small air-fluid levels in the sphenoid sinus, nonspecific. Electronically Signed   By: Lorriane Shire M.D.   On: 07/08/2015 20:31   Dg Chest Port 1 View  07/07/2015  CLINICAL DATA:  Right-sided facial droop and lethargy.  Hypoxia. EXAM: PORTABLE CHEST 1 VIEW COMPARISON:  09/07/2012 FINDINGS: Midline trachea. Cardiomegaly accentuated by AP portable technique. Atherosclerosis in the transverse aorta. No pleural effusion or pneumothorax. Low lung volumes with resultant pulmonary interstitial prominence. Patchy left base airspace disease IMPRESSION: Low lung volumes. Patchy  left base airspace disease is favored to represent atelectasis superimposed upon scarring. Early infection or aspiration cannot be excluded. Cardiomegaly with aortic atherosclerosis. Electronically Signed   By: Abigail Miyamoto M.D.   On: 07/07/2015 18:42   Dg Swallowing Func-speech Pathology  07/08/2015  Objective Swallowing Evaluation:   MBS Patient Details Name: JOVANNA JARRIN MRN: [id number] Date of Birth: 1935/02/13 Today's Date: 07/08/2015 Time: SLP Start Time (ACUTE ONLY): 1000-SLP Stop Time (ACUTE ONLY): 1017 SLP Time Calculation (min) (ACUTE ONLY): 17 min Past Medical History: Past Medical History Diagnosis Date . Hypertension  .  Diabetes mellitus  . GERD (gastroesophageal reflux disease)  . Neuromuscular disorder ([place])  . Multiple sclerosis ([place])  . Anxiety  . Hyperlipemia  . Pneumonia  . Colitis  . Unable to ambulate  . GI bleed 09/07/2012 . Type II or unspecified type diabetes mellitus without mention of complication, uncontrolled 02/03/2013 . Ulcerative colitis, unspecified 02/03/2013 . Chronic diastolic CHF (congestive heart failure) [facility])  Past Surgical History: Past Surgical History Procedure Laterality Date . Tubal ligation   . Flexible sigmoidoscopy  10/05/2011   Procedure: FLEXIBLE SIGMOIDOSCOPY;  Surgeon: Winfield Cunas., MD;  Location: [facility];  Service: Endoscopy;  Laterality: N/A;  pt. coming from [place] daughter phone no. [phone] might come with her sedation is needed, pt. is paraplegic . Esophagogastroduodenoscopy N/A 09/09/2012   Procedure: ESOPHAGOGASTRODUODENOSCOPY (EGD);  Surgeon: Winfield Cunas., MD;  Location: [facility] ENDOSCOPY;  Service: Endoscopy;  Laterality: N/A; . Colonoscopy N/A 09/09/2012   Procedure: COLONOSCOPY;  Surgeon: Winfield Cunas., MD;  Location: [facility] ENDOSCOPY;  Service: Endoscopy;  Laterality: N/A;  possible flex sig HPI: 80 y.o. female with PMH of hypertension, diabetes mellitus, GERD, anxiety, IBD, UC, GI bleeding, severe MS with  quadriplegia and difficulty speaking, who presents with productive cough and shortness of breath and questionable facial drooop from SNF. In ED, patient was found to have positive urinalysis for UTI, positive troponin. Chest x-ray showed patchy left base airspace disease, early infection or aspiration cannot be excluded.  Previous MBS 2013 revealed pharyngeal stasis, no penetration or aspiration, Dys 3 thin liquids recommended. RN received report pt is on thick liquids at SNF although pt denies and states she drinks thin liquids. Difficult to assess safety with thin liquids with sole bedside swallow.   No Data Recorded Assessment / Plan / Recommendation CHL IP CLINICAL IMPRESSIONS 07/08/2015 Therapy Diagnosis -- Clinical Impression Decreased oral cohesion and mildly delayed transit due to weakness. Mod-severe sensory>motor pharyngeal dysphagia. Pt lacks head control therefore swallow function assessed in both flexed and extended (more supine) positions. Most swallows were initiated at the level of the pyriform sinuses due to decreased sensation. Laryngeal penetration, flash with initial trial thin and silently aspirated in head extension and semi upright position using straw with thin. Decreased cervical esophageal pressures led to barium backflow/residuals to pyriform sinuses. Aspiration is significantly increased as pt is a dependent feeder and dependent on staff for proper head and trunk positioning. SLP recommends Dys 1 diet (due to dentures not present) and nectar thick liquids, upright as possible and head in neutral, small sips, straw allowed and pills whole in applesauce.    Impact on safety and function Severe aspiration risk   CHL IP TREATMENT RECOMMENDATION 07/08/2015 Treatment Recommendations Therapy as outlined in treatment plan below   Prognosis 07/08/2015 Prognosis for Safe Diet Advancement Fair Barriers to Reach Goals Cognitive deficits;Severity of deficits Barriers/Prognosis Comment -- CHL IP DIET  RECOMMENDATION 07/08/2015 SLP Diet Recommendations Dysphagia 1 (Puree) solids;Nectar thick liquid Liquid Administration via Cup;Straw Medication Administration Whole meds with puree Compensations Slow rate;Small sips/bites Postural Changes Remain semi-upright after after feeds/meals (Comment)   CHL IP OTHER RECOMMENDATIONS 07/08/2015 Recommended Consults -- Oral Care Recommendations Oral care BID Other Recommendations Order thickener from pharmacy   CHL IP FOLLOW UP RECOMMENDATIONS 07/08/2015 Follow up Recommendations Skilled Nursing facility   [facility] IP FREQUENCY AND DURATION 07/08/2015 Speech Therapy Frequency (ACUTE ONLY) min 2x/week Treatment Duration 2 weeks      CHL IP ORAL PHASE 07/08/2015 Oral Phase Impaired Oral - Pudding  Teaspoon -- Oral - Pudding Cup -- Oral - Honey Teaspoon -- Oral - Honey Cup -- Oral - Nectar Teaspoon -- Oral - Nectar Cup Decreased bolus cohesion Oral - Nectar Straw -- Oral - Thin Teaspoon -- Oral - Thin Cup Decreased bolus cohesion Oral - Thin Straw -- Oral - Puree Delayed oral transit Oral - Mech Soft -- Oral - Regular -- Oral - Multi-Consistency -- Oral - Pill Delayed oral transit Oral Phase - Comment --  CHL IP PHARYNGEAL PHASE 07/08/2015 Pharyngeal Phase Impaired Pharyngeal- Pudding Teaspoon -- Pharyngeal -- Pharyngeal- Pudding Cup -- Pharyngeal -- Pharyngeal- Honey Teaspoon -- Pharyngeal -- Pharyngeal- Honey Cup -- Pharyngeal -- Pharyngeal- Nectar Teaspoon -- Pharyngeal -- Pharyngeal- Nectar Cup Penetration/Aspiration during swallow;Pharyngeal residue - valleculae;Pharyngeal residue - pyriform;Reduced tongue base retraction;Reduced laryngeal elevation Pharyngeal Material enters airway, remains ABOVE vocal cords and not ejected out Pharyngeal- Nectar Straw -- Pharyngeal -- Pharyngeal- Thin Teaspoon -- Pharyngeal -- Pharyngeal- Thin Cup Penetration/Aspiration during swallow;Delayed swallow initiation-pyriform sinuses;Pharyngeal residue - valleculae;Pharyngeal residue - pyriform;Reduced  tongue base retraction;Reduced laryngeal elevation Pharyngeal Material enters airway, passes BELOW cords without attempt by patient to eject out (silent aspiration);Material enters airway, CONTACTS cords and not ejected out;Material enters airway, remains ABOVE vocal cords and not ejected out Pharyngeal- Thin Straw -- Pharyngeal -- Pharyngeal- Puree Delayed swallow initiation-pyriform sinuses;Pharyngeal residue - valleculae;Reduced tongue base retraction Pharyngeal -- Pharyngeal- Mechanical Soft -- Pharyngeal -- Pharyngeal- Regular -- Pharyngeal -- Pharyngeal- Multi-consistency -- Pharyngeal -- Pharyngeal- Pill Delayed swallow initiation-vallecula Pharyngeal -- Pharyngeal Comment --  CHL IP CERVICAL ESOPHAGEAL PHASE 07/08/2015 Cervical Esophageal Phase Impaired Pudding Teaspoon -- Pudding Cup -- Honey Teaspoon -- Honey Cup -- Nectar Teaspoon -- Nectar Cup -- Nectar Straw -- Thin Teaspoon -- Thin Cup -- Thin Straw -- Puree -- Mechanical Soft -- Regular -- Multi-consistency -- Pill -- Cervical Esophageal Comment (No Data) [name] 07/08/2015, 11:25 AM  [name] [name].Ed CCC-SLP Pager [phone]              Scheduled Meds: . aspirin EC  81 mg Oral Daily  . atorvastatin  40 mg Oral q1800  . guaiFENesin  600 mg Oral BID  . insulin aspart  0-9 Units Subcutaneous Q4H  . levalbuterol  1.25 mg Nebulization Q6H  . loratadine  10 mg Oral Daily  . metoprolol tartrate  12.5 mg Oral Daily  . pantoprazole (PROTONIX) IV  40 mg Intravenous Q24H  . piperacillin-tazobactam (ZOSYN)  IV  3.375 g Intravenous Q8H  . sulfaDIAZINE  1,000 mg Oral Q12H  . vancomycin  500 mg Intravenous Q12H  . vitamin B-12  100 mcg Oral Daily  . zinc oxide   Topical Daily   Continuous Infusions: . heparin 900 Units/hr (07/09/15 0713)    Principal Problem:   Acute respiratory failure with hypoxia and hypercarbia (HCC) Active Problems:   MS (multiple sclerosis) (HCC)   Ulcerative colitis ([place])   GERD  (gastroesophageal reflux disease)   Essential hypertension, benign   Anxiety state   Diabetes mellitus type II, controlled, with no complications (HCC)   UTI (lower urinary tract infection)   NSTEMI (non-ST elevated myocardial infarction) (HCC)   Chronic diastolic CHF (congestive heart failure) ([place])   Sepsis ([place])   Aspiration pneumonia ([place])   HCAP (healthcare-associated pneumonia)    Time spent: 30 min    [name], [facility] Pager [phone]. If 7PM-7AM, please contact night-coverage at [email or web address], password [facility] 07/09/2015, 8:11 AM  LOS: 2 days

## 2015-07-09 NOTE — Consult Note (Signed)
CARDIOLOGY CONSULT NOTE   Patient ID: [name] MRN: [id number] DOB/AGE: 12-21-1934 80 y.o.  Admit date: 07/07/2015  Requesting physician: Dr. Sheran Fava Primary Physician   Blanchie Serve, MD Primary Cardiologist   New Reason for Consultation   NSTEMI  HPI: [name] is a 80 y.o. female with a history of hypertension, diabetes mellitus, GERD, anxiety, IBD, UC, GI bleeding, severe MS with quadriplegia and difficulty speaking who presented to [facility] on 07/07/15 for facial droop/lethargy. Facial droop resolved by the time of presentation. However, she was found to have acure respiratory failure w/ hypoxia/hypercarbia and sepsis due to UTI and PNA. Also NSTEMI so cardiology was consulted.   Patient is from a nursing home. She was initially brought in because of possible right facial droop. Staff noticed possible right sided facial droop and lethargy. Upon arrival to ED facial droop had resolved but she was noted to have SOB and a productive cough with yellow sputum production. She was also hypotensive with blood pressure 85/54, which improved to 117/72 after IV fluid. In ED, patient was found to have positive urinalysis for UTI, positive troponin 0.31-->1.23, lactate 1.8, WBC 8.7, tachycardia, tachypnea, oxygen desaturations to 70% on room air. Chest x-ray showed patchy left base airspace disease.   Repeat 2D ECHO on 07/08/2015 showed EF  60- 65%, mod LVH. No RWMAs. Doppler parameters are consistent with elevated ventricular end-diastolic filling pressure.PA peak pressure: 35 mm Hg (S).There was no evidence of a vegetation.   Last night the patient was noted to have a change in mental status. Stat head CT with no acute abnormality and ABG with worsening acidosis and hypercarbia. Also notified that patient had positive blood culture with G+ cocci in clusters. Repeat blood cultures pending. Troponin 1.23--> 1.12--> 0.99.  She is currently lying in bed on [place] 02. She appear comfortable. No SOB  currently. She denies CP. She is asking when she can eat. Daughter at bedside. No LE edema, orthopnea, PND.     Past Medical History  Diagnosis Date  . Hypertension   . Diabetes mellitus   . GERD (gastroesophageal reflux disease)   . Neuromuscular disorder ([place])   . Multiple sclerosis ([place])   . Anxiety   . Hyperlipemia   . Pneumonia   . Colitis   . Unable to ambulate   . GI bleed 09/07/2012  . Type II or unspecified type diabetes mellitus without mention of complication, uncontrolled 02/03/2013  . Ulcerative colitis, unspecified 02/03/2013  . Chronic diastolic CHF (congestive heart failure) [facility])      Past Surgical History  Procedure Laterality Date  . Tubal ligation    . Flexible sigmoidoscopy  10/05/2011    Procedure: FLEXIBLE SIGMOIDOSCOPY;  Surgeon: Winfield Cunas., MD;  Location: [facility];  Service: Endoscopy;  Laterality: N/A;   pt. coming from [place] daughter phone no. [phone] might come with her  sedation is needed, pt. is paraplegic  . Esophagogastroduodenoscopy N/A 09/09/2012    Procedure: ESOPHAGOGASTRODUODENOSCOPY (EGD);  Surgeon: Winfield Cunas., MD;  Location: [facility] ENDOSCOPY;  Service: Endoscopy;  Laterality: N/A;  . Colonoscopy N/A 09/09/2012    Procedure: COLONOSCOPY;  Surgeon: Winfield Cunas., MD;  [facility] ENDOSCOPY;  Service: Endoscopy;  Laterality: N/A;  possible flex sig    Allergies  Allergen Reactions  . Contrast Media [Iodinated Diagnostic Agents]     Unknown    I have reviewed the patient's current medications . aspirin EC  81 mg  Oral Daily  . atorvastatin  40 mg Oral q1800  . guaiFENesin  600 mg Oral BID  . insulin aspart  0-9 Units Subcutaneous Q4H  . levalbuterol  1.25 mg Nebulization Q6H  . loratadine  10 mg Oral Daily  . metoprolol tartrate  12.5 mg Oral Daily  . pantoprazole sodium  40 mg Per Tube Daily  . piperacillin-tazobactam (ZOSYN)  IV  3.375 g Intravenous Q8H  . sulfaDIAZINE  1,000 mg  Oral Q12H  . vancomycin  500 mg Intravenous Q12H  . vitamin B-12  100 mcg Oral Daily  . zinc oxide   Topical Daily   . heparin 900 Units/hr (07/09/15 0713)   acetaminophen, hydrALAZINE, nitroGLYCERIN, ondansetron (ZOFRAN) IV, polyethylene glycol, RESOURCE THICKENUP CLEAR  Prior to Admission medications   Medication Sig Start Date End Date Taking? Authorizing Provider  acetaminophen (TYLENOL) 325 MG tablet Take 650 mg by mouth every 4 (four) hours as needed.   Yes Historical Provider, MD  cetirizine (ZYRTEC) 10 MG tablet Take one tablet by mouth every night at bedtime for allergic rhinitis   Yes Historical Provider, MD  ferrous sulfate 325 (65 FE) MG EC tablet Take 325 mg by mouth 3 (three) times daily with meals.   Yes Historical Provider, MD  gabapentin (NEURONTIN) 100 MG capsule 1 capsule by mouth three times daily for MS 06/01/15  Yes Sharon Seller, NP  guaiFENesin (MUCINEX) 600 MG 12 hr tablet Take 600 mg by mouth 2 (two) times daily.   Yes Historical Provider, MD  linagliptin (TRADJENTA) 5 MG TABS tablet Take 1 tablet (5 mg total) by mouth daily. Patient taking differently: Take one tablet by mouth once daily for blood sugar 03/18/15  Yes Sharon Seller, NP  lisinopril (PRINIVIL,ZESTRIL) 2.5 MG tablet Take one tablet by mouth once daily for hypertension   Yes Historical Provider, MD  metFORMIN (GLUCOPHAGE) 500 MG tablet Take 1 tablet (500 mg total) by mouth daily with breakfast. Patient taking differently: Take 500 mg by mouth 2 (two) times daily with a meal. Take one tablet by mouth twice daily to control blood sugar 03/19/15  Yes Sharon Seller, NP  metoprolol tartrate (LOPRESSOR) 25 MG tablet Take 1/2 tablet by mouth daily for blood pressure. HOLD for SBP <110 11/24/11  Yes Lesle Chris Black, NP  pantoprazole (PROTONIX) 40 MG tablet Take one tablet by mouth twice daily for reflux   Yes Historical Provider, MD  polyethylene glycol (MIRALAX / GLYCOLAX) packet Mix powder in 4-8 oz of  liquid once daily for constipation 06/01/15  Yes Sharon Seller, NP  sulfaDIAZINE 500 MG tablet Take two tablets by mouth twice daily with H2O for IBS   Yes Historical Provider, MD  traZODone (DESYREL) 50 MG tablet Take 1/2 tablet by mouth every night at bedtime for insomnia   Yes Historical Provider, MD  vitamin B-12 100 MCG tablet Take 1 tablet (100 mcg total) by mouth daily. 09/11/12  Yes Laveda Norman, MD  zinc oxide (BALMEX) 11.3 % CREA cream Apply cream topically to groin and buttock every shift as preventative care   Yes Historical Provider, MD     Social History   Social History  . Marital Status: Married    Spouse Name: N/A  . Number of Children: N/A  . Years of Education: N/A   Occupational History  . Not on file.   Social History Main Topics  . Smoking status: Former Smoker -- 1.50 packs/day for 30 years    Quit  date: 11/16/1995  . Smokeless tobacco: Never Used  . Alcohol Use: No  . Drug Use: No  . Sexual Activity: Not on file   Other Topics Concern  . Not on file   Social History Narrative    No family status information on file.   Family History  Problem Relation Age of Onset  . CAD Mother   . Diabetes Daughter   . Diabetes Daughter   . Diabetes Son      ROS:  Full 14 point review of systems complete and found to be negative unless listed above.  Physical Exam: Blood pressure 112/55, pulse 115, temperature 98.2 F (36.8 C), temperature source Oral, resp. rate 23, weight 124 lb (56.246 kg), SpO2 99 %.  General: Well developed, well nourished, female in no acute distress. Bedbound. Chronically disabled Head: Eyes PERRLA, No xanthomas.   Normocephalic and atraumatic, oropharynx without edema or exudate.   Lungs: did not auscultate  Heart: HRRR S1 S2, no rub/gallop, Heart regular rate and rhythm with S1, S2  murmur. pulses are 2+ extrem.   Neck: No carotid bruits. No lymphadenopathy. No JVD. Abdomen: Bowel sounds present, abdomen soft and non-tender without  masses or hernias noted. Msk:  No spine or cva tenderness. No weakness, no joint deformities or effusions. Extremities: No clubbing or cyanosis.  1+ edema.  Neuro: Alert and oriented X 3. No focal deficits noted. Psych:  Good affect, responds appropriately Skin: No rashes or lesions noted.  Labs:   Lab Results  Component Value Date   WBC 6.9 07/09/2015   HGB 10.6* 07/09/2015   HCT 37.7 07/09/2015   MCV 84.0 07/09/2015   PLT 243 07/09/2015    Recent Labs  07/07/15 2049  INR 1.24    Recent Labs Lab 07/07/15 1805 07/08/15 0720  NA 145 142  K 4.7 4.1  CL 105 106  CO2 31 29  BUN 12 9  CREATININE 0.46 0.33*  CALCIUM 10.0 8.8*  PROT 7.6  --   BILITOT 0.1*  --   ALKPHOS 63  --   ALT 11*  --   AST 23  --   GLUCOSE 154* 122*  ALBUMIN 3.5  --    MAGNESIUM  Date Value Ref Range Status  09/10/2012 1.7 1.5 - 2.5 mg/dL Final    Recent Labs  07/07/15 2049 07/08/15 0309 07/08/15 0720  TROPONINI 1.23* 1.12* 0.99*    Recent Labs  07/07/15 1821  TROPIPOC 0.31*   PRO B NATRIURETIC PEPTIDE (BNP)  Date/Time Value Ref Range Status  09/01/2009 02:56 AM 81.0 0.0 - 100.0 pg/mL Final  10/30/2007 12:03 AM 45.9  Final   Lab Results  Component Value Date   CHOL 154 07/08/2015   HDL 24* 07/08/2015   LDLCALC 101* 07/08/2015   TRIG 144 07/08/2015   Lab Results  Component Value Date   DDIMER 1.29* 07/07/2015   LIPASE  Date/Time Value Ref Range Status  10/29/2007 09:25 PM 15  Final     Echo: Study Date: 07/08/2015 LV EF: 60% -   65% Study Conclusions - Left ventricle: The cavity size was normal. Wall thickness was   increased in a pattern of moderate LVH. Systolic function was   normal. The estimated ejection fraction was in the range of 60%   to 65%. Wall motion was normal; there were no regional wall   motion abnormalities. Doppler parameters are consistent with   elevated ventricular end-diastolic filling pressure. - Mitral valve: Calcified annulus. Mildly  thickened leaflets . -  Pulmonary arteries: PA peak pressure: 35 mm Hg (S). Impressions: - There was no evidence of a vegetation.    ECG:  07/07/15 sinus tachycardia HR 125.   Radiology:  Ct Head Wo Contrast  07/08/2015  CLINICAL DATA:  Acute onset of altered mental status tonight. EXAM: CT HEAD WITHOUT CONTRAST TECHNIQUE: Contiguous axial images were obtained from the base of the skull through the vertex without intravenous contrast. COMPARISON:  CT scan dated 10/29/2007 FINDINGS: No mass lesion. No midline shift. No acute hemorrhage or hematoma. No extra-axial fluid collections. No evidence of acute infarction. There is diffuse slight cerebral cortical atrophy. Slight periventricular lucency in the frontal lobes, left more than right, chronic and unchanged. There are new air-fluid levels in the sphenoid sinus. The osseous structures otherwise are unchanged with prominent chronic hyperostosis frontalis interna. IMPRESSION: 1. No acute abnormality of the brain. Slight atrophy with chronic slight white matter disease in the frontal lobes, probably small vessel ischemic change. 2. New small air-fluid levels in the sphenoid sinus, nonspecific. Electronically Signed   By: Lorriane Shire M.D.   On: 07/08/2015 20:31   Dg Chest Port 1 View  07/07/2015  CLINICAL DATA:  Right-sided facial droop and lethargy.  Hypoxia. EXAM: PORTABLE CHEST 1 VIEW COMPARISON:  09/07/2012 FINDINGS: Midline trachea. Cardiomegaly accentuated by AP portable technique. Atherosclerosis in the transverse aorta. No pleural effusion or pneumothorax. Low lung volumes with resultant pulmonary interstitial prominence. Patchy left base airspace disease IMPRESSION: Low lung volumes. Patchy left base airspace disease is favored to represent atelectasis superimposed upon scarring. Early infection or aspiration cannot be excluded. Cardiomegaly with aortic atherosclerosis. Electronically Signed   By: Abigail Miyamoto M.D.   On: 07/07/2015 18:42   Dg  Swallowing Func-speech Pathology  07/08/2015  Objective Swallowing Evaluation:   MBS Patient Details Name: MICHALLE RADEMAKER MRN: [id number] Date of Birth: 02/02/1935 Today's Date: 07/08/2015 Time: SLP Start Time (ACUTE ONLY): 1000-SLP Stop Time (ACUTE ONLY): 1017 SLP Time Calculation (min) (ACUTE ONLY): 17 min Past Medical History: Past Medical History Diagnosis Date . Hypertension  . Diabetes mellitus  . GERD (gastroesophageal reflux disease)  . Neuromuscular disorder ([place])  . Multiple sclerosis ([place])  . Anxiety  . Hyperlipemia  . Pneumonia  . Colitis  . Unable to ambulate  . GI bleed 09/07/2012 . Type II or unspecified type diabetes mellitus without mention of complication, uncontrolled 02/03/2013 . Ulcerative colitis, unspecified 02/03/2013 . Chronic diastolic CHF (congestive heart failure) [facility])  Past Surgical History: Past Surgical History Procedure Laterality Date . Tubal ligation   . Flexible sigmoidoscopy  10/05/2011   Procedure: FLEXIBLE SIGMOIDOSCOPY;  Surgeon: Winfield Cunas., MD;  Location: [facility];  Service: Endoscopy;  Laterality: N/A;  pt. coming from [place] daughter phone no. [phone] might come with her sedation is needed, pt. is paraplegic . Esophagogastroduodenoscopy N/A 09/09/2012   Procedure: ESOPHAGOGASTRODUODENOSCOPY (EGD);  Surgeon: Winfield Cunas., MD;  Location: [facility] ENDOSCOPY;  Service: Endoscopy;  Laterality: N/A; . Colonoscopy N/A 09/09/2012   Procedure: COLONOSCOPY;  Surgeon: Winfield Cunas., MD;  Location: [facility] ENDOSCOPY;  Service: Endoscopy;  Laterality: N/A;  possible flex sig HPI: 80 y.o. female with PMH of hypertension, diabetes mellitus, GERD, anxiety, IBD, UC, GI bleeding, severe MS with quadriplegia and difficulty speaking, who presents with productive cough and shortness of breath and questionable facial drooop from SNF. In ED, patient was found to have positive urinalysis for UTI, positive troponin. Chest x-ray showed patchy left base airspace  disease, early  infection or aspiration cannot be excluded.  Previous MBS 2013 revealed pharyngeal stasis, no penetration or aspiration, Dys 3 thin liquids recommended. RN received report pt is on thick liquids at SNF although pt denies and states she drinks thin liquids. Difficult to assess safety with thin liquids with sole bedside swallow.   No Data Recorded Assessment / Plan / Recommendation CHL IP CLINICAL IMPRESSIONS 07/08/2015 Therapy Diagnosis -- Clinical Impression Decreased oral cohesion and mildly delayed transit due to weakness. Mod-severe sensory>motor pharyngeal dysphagia. Pt lacks head control therefore swallow function assessed in both flexed and extended (more supine) positions. Most swallows were initiated at the level of the pyriform sinuses due to decreased sensation. Laryngeal penetration, flash with initial trial thin and silently aspirated in head extension and semi upright position using straw with thin. Decreased cervical esophageal pressures led to barium backflow/residuals to pyriform sinuses. Aspiration is significantly increased as pt is a dependent feeder and dependent on staff for proper head and trunk positioning. SLP recommends Dys 1 diet (due to dentures not present) and nectar thick liquids, upright as possible and head in neutral, small sips, straw allowed and pills whole in applesauce.    Impact on safety and function Severe aspiration risk   CHL IP TREATMENT RECOMMENDATION 07/08/2015 Treatment Recommendations Therapy as outlined in treatment plan below   Prognosis 07/08/2015 Prognosis for Safe Diet Advancement Fair Barriers to Reach Goals Cognitive deficits;Severity of deficits Barriers/Prognosis Comment -- CHL IP DIET RECOMMENDATION 07/08/2015 SLP Diet Recommendations Dysphagia 1 (Puree) solids;Nectar thick liquid Liquid Administration via Cup;Straw Medication Administration Whole meds with puree Compensations Slow rate;Small sips/bites Postural Changes Remain semi-upright after  after feeds/meals (Comment)   CHL IP OTHER RECOMMENDATIONS 07/08/2015 Recommended Consults -- Oral Care Recommendations Oral care BID Other Recommendations Order thickener from pharmacy   CHL IP FOLLOW UP RECOMMENDATIONS 07/08/2015 Follow up Recommendations Skilled Nursing facility   [facility] IP FREQUENCY AND DURATION 07/08/2015 Speech Therapy Frequency (ACUTE ONLY) min 2x/week Treatment Duration 2 weeks      CHL IP ORAL PHASE 07/08/2015 Oral Phase Impaired Oral - Pudding Teaspoon -- Oral - Pudding Cup -- Oral - Honey Teaspoon -- Oral - Honey Cup -- Oral - Nectar Teaspoon -- Oral - Nectar Cup Decreased bolus cohesion Oral - Nectar Straw -- Oral - Thin Teaspoon -- Oral - Thin Cup Decreased bolus cohesion Oral - Thin Straw -- Oral - Puree Delayed oral transit Oral - Mech Soft -- Oral - Regular -- Oral - Multi-Consistency -- Oral - Pill Delayed oral transit Oral Phase - Comment --  CHL IP PHARYNGEAL PHASE 07/08/2015 Pharyngeal Phase Impaired Pharyngeal- Pudding Teaspoon -- Pharyngeal -- Pharyngeal- Pudding Cup -- Pharyngeal -- Pharyngeal- Honey Teaspoon -- Pharyngeal -- Pharyngeal- Honey Cup -- Pharyngeal -- Pharyngeal- Nectar Teaspoon -- Pharyngeal -- Pharyngeal- Nectar Cup Penetration/Aspiration during swallow;Pharyngeal residue - valleculae;Pharyngeal residue - pyriform;Reduced tongue base retraction;Reduced laryngeal elevation Pharyngeal Material enters airway, remains ABOVE vocal cords and not ejected out Pharyngeal- Nectar Straw -- Pharyngeal -- Pharyngeal- Thin Teaspoon -- Pharyngeal -- Pharyngeal- Thin Cup Penetration/Aspiration during swallow;Delayed swallow initiation-pyriform sinuses;Pharyngeal residue - valleculae;Pharyngeal residue - pyriform;Reduced tongue base retraction;Reduced laryngeal elevation Pharyngeal Material enters airway, passes BELOW cords without attempt by patient to eject out (silent aspiration);Material enters airway, CONTACTS cords and not ejected out;Material enters airway, remains ABOVE  vocal cords and not ejected out Pharyngeal- Thin Straw -- Pharyngeal -- Pharyngeal- Puree Delayed swallow initiation-pyriform sinuses;Pharyngeal residue - valleculae;Reduced tongue base retraction Pharyngeal -- Pharyngeal- Mechanical Soft -- Pharyngeal -- Pharyngeal- Regular --  Pharyngeal -- Pharyngeal- Multi-consistency -- Pharyngeal -- Pharyngeal- Pill Delayed swallow initiation-vallecula Pharyngeal -- Pharyngeal Comment --  CHL IP CERVICAL ESOPHAGEAL PHASE 07/08/2015 Cervical Esophageal Phase Impaired Pudding Teaspoon -- Pudding Cup -- Honey Teaspoon -- Honey Cup -- Nectar Teaspoon -- Nectar Cup -- Nectar Straw -- Thin Teaspoon -- Thin Cup -- Thin Straw -- Puree -- Mechanical Soft -- Regular -- Multi-consistency -- Pill -- Cervical Esophageal Comment (No Data) Houston Siren 07/08/2015, 11:25 AM  Orbie Pyo Colvin Caroli.Ed Engineer, agricultural [phone]              ASSESSMENT AND PLAN:    Principal Problem:   Acute respiratory failure with hypoxia and hypercarbia (HCC) Active Problems:   MS (multiple sclerosis) (HCC)   Ulcerative colitis (HCC)   GERD (gastroesophageal reflux disease)   Essential hypertension, benign   Anxiety state   Diabetes mellitus type II, controlled, with no complications (HCC)   UTI (lower urinary tract infection)   NSTEMI (non-ST elevated myocardial infarction) (HCC)   Chronic diastolic CHF (congestive heart failure) (HCC)   Sepsis ([place])   Aspiration pneumonia ([place])   HCAP (healthcare-associated pneumonia)    ALAINA DONATI is a 80 y.o. female with a history of hypertension, diabetes mellitus, GERD, anxiety, IBD, UC, GI bleeding, severe MS with quadriplegia and difficulty speaking who presented to [facility] on 07/07/15 for facial droop/lethargy. Facial droop resolved by the time of presentation. However, she was found to have acure respiratory failure w/ hypoxia/hypercarbia and sepsis due to UTI and PNA. Also NSTEMI so cardiology was consulted.   NSTEMI: trop 0.31-->1.23 -->  1.12--> 0.99 likely secondary to demand ischemia in the setting of sepsis. - EKG with no acute ST or TW changes  - continue ASA 81 mg . No indication for further cardiac work up. She is not a cardiac cath candidate. Will continue medical therapy for now.   Elevated DDimer: 1.29. She does have sinus tachycardia and hypoxia. She had RFs for PE due to chronic disability. Continue heparin gtt for now  Sepsis 2/2 UTI and PNA: Patient met criteria for sepsis with tachycardia, tachypnea and hypotension. Blood pressure responded to IV fluids. Currently hemodynamically stable. - lactic acid levels wnl - on IV Abx per IM  Acute respiratory failure with hypoxia and hypercarbia ([place]): this is likely due to HCAP vs. aspiration pneumonia.  - Continue IV Abx per IM  Chronic diastolic congestive heart failure: 2-D echo 07/07/15 with 60- 65%, mod LVH. No RWMAs. Doppler parameters are consistent with elevated ventricular end-diastolic filling pressure.PA peak pressure: 35 mm Hg (S).There was no evidence of a vegetation.   MS (multiple sclerosis) ([place])  HTN: controlled  Ulcerative colitis ([place]) and IBS: stable -continue sulfadiazine  DM-II: Last A1c 6.9 on 02/26/15, well controled. Patient is taking metformin and Tradjenta at home   Signed: Crista Luria 07/09/2015 10:03 AM  Pager [phone].  Co-Sign MD  Attending Note:   The patient was seen and examined.  Agree with assessment and plan as noted above.  Changes made to the above note as needed.  Symptoms are not c/w ACS .  More likely dueto demand ischemia from hypotension and hypoxia. I thinks she most likely has aspiration pneumonia or perhaps a pulmonary embolus. She may also have a UTI.   She is not a good candidate for invasive cardiac procedures.  I would not pursue a myoview study  Continue medical managmenet.    Thayer Headings, Brooke Bonito., MD, [facility] 07/09/2015, 2:11 PM  [place]. [address],  [place] Pager [phone]

## 2015-07-09 NOTE — Progress Notes (Signed)
RT Note:  RN called and stated NP, Shore had been at bedside and questioned patient about code status and  Asked if she wanted to go back on BIPAP. Pt explained facial pain from mask and NP request skin barrier beneath BIPAP mask. This was attempted earlier without success.  RT applied barrier and place BIPAP mask back on patient. RN administered Morphine. Pt seems to be ok at this time. RT & RN will monitor.

## 2015-07-09 NOTE — Progress Notes (Signed)
Initial Nutrition Assessment  DOCUMENTATION CODES:   Not applicable  INTERVENTION:   Magic cup TID with meals, each supplement provides 290 kcal and 9 grams of protein  NUTRITION DIAGNOSIS:   Predicted suboptimal nutrient intake related to dysphagia as evidenced by  (chart review)   GOAL:   Patient will meet greater than or equal to 90% of their needs  MONITOR:   PO intake, Supplement acceptance, Labs, Weight trends, I & O's  REASON FOR ASSESSMENT:   Low Braden  ASSESSMENT:   80 y.o. Female with a history of hypertension, diabetes mellitus, GERD, anxiety, IBD, UC, GI bleeding, severe MS with quadriplegia and difficulty speaking who presented to [facility] on 07/07/15 for facial droop/lethargy. Facial droop resolved by the time of presentation. However, she was found to have acure respiratory failure w/ hypoxia/hypercarbia and sepsis due to UTI and PNA.  RD unable to speak with patient at time of visit.  Sleeping.  Did not wake.  S/p bedside swallow evaluation 12/28.  SLP recommending Dys 1, nectar thick liquid diet after session today.  Low braden score places patient at risk for skin breakdown.  RD to add oral nutrition supplements.  RD unable to complete Nutrition Focused Physical Exam at this time.  Diet Order:  DIET - DYS 1 Room service appropriate?: Yes; Fluid consistency:: Nectar Thick  Skin:  Reviewed, no issues  Last BM:  12/24  Height:   Ht Readings from Last 1 Encounters:  07/09/15 5\' 5"  (1.651 m)    Weight:   Wt Readings from Last 1 Encounters:  07/07/15 124 lb (56.246 kg)    Ideal Body Weight:  56.8 kg  BMI:  Body mass index is 20.63 kg/(m^2).  Estimated Nutritional Needs:   Kcal:  1400-1600  Protein:  60-70 gm  Fluid:  >/= 1.5 L  EDUCATION NEEDS:   No education needs identified at this time  Arthur Holms, RD, LDN Pager #: [phone] After-Hours Pager #: [phone]

## 2015-07-09 NOTE — Progress Notes (Signed)
ANTICOAGULATION CONSULT NOTE - Follow Up Consult  Pharmacy Consult:  Heparin Indication: chest pain/ACS  Allergies  Allergen Reactions  . Contrast Media [Iodinated Diagnostic Agents]     Unknown    Patient Measurements: Weight: 124 lb (56.246 kg) Heparin Dosing Weight: 56 kg  Vital Signs: Temp: 98.2 F (36.8 C) (12/29 0045) Temp Source: Oral (12/29 0045) BP: 108/50 mmHg (12/29 0214) Pulse Rate: 86 (12/29 0214)  Labs:  Recent Labs  07/07/15 1805 07/07/15 2049 07/08/15 0309 07/08/15 0720 07/08/15 1105 07/09/15 0550 07/09/15 0555  HGB 14.1  --   --   --   --  10.6*  --   HCT 49.5*  --   --   --   --  37.7  --   PLT PLATELET CLUMPS NOTED ON SMEAR, COUNT APPEARS ADEQUATE  --   --   --   --  243  --   APTT  --  33  --   --   --   --   --   LABPROT  --  15.8*  --   --   --   --   --   INR  --  1.24  --   --   --   --   --   HEPARINUNFRC  --   --   --   --  0.27*  --  0.26*  CREATININE 0.46  --   --  0.33*  --   --   --   TROPONINI  --  1.23* 1.12* 0.99*  --   --   --     Estimated Creatinine Clearance: 50 mL/min (by C-G formula based on Cr of 0.33).  Assessment: 76 YOF with history of GIB in 2014 to continue on IV heparin fo possible ACS. Heparin level subtherapeutic (0.26) on 800 units/hr. CBC stable. No issues with line or bleeding reported per RN.  Goal of Therapy:  Heparin level 0.3-0.7 units/ml Monitor platelets by anticoagulation protocol: Yes    Plan:  -Incrase heparin to 900 units/hr -Heparin level in 8 hrs  Sherlon Handing, PharmD, BCPS Clinical pharmacist, pager [phone]  07/09/2015 6:42 AM

## 2015-07-10 DIAGNOSIS — N390 Urinary tract infection, site not specified: Secondary | ICD-10-CM

## 2015-07-10 DIAGNOSIS — I248 Other forms of acute ischemic heart disease: Secondary | ICD-10-CM

## 2015-07-10 LAB — BASIC METABOLIC PANEL
Anion gap: 14 (ref 5–15)
BUN: 6 mg/dL (ref 6–20)
CHLORIDE: 110 mmol/L (ref 101–111)
CO2: 19 mmol/L — AB (ref 22–32)
CREATININE: 0.4 mg/dL — AB (ref 0.44–1.00)
Calcium: 9.3 mg/dL (ref 8.9–10.3)
GFR calc Af Amer: >60 mL/min (ref >60)
GFR calc non Af Amer: >60 mL/min (ref >60)
Glucose, Bld: 92 mg/dL (ref 65–99)
POTASSIUM: 5.2 mmol/L — AB (ref 3.5–5.1)
Sodium: 143 mmol/L (ref 135–145)

## 2015-07-10 LAB — GLUCOSE, CAPILLARY
GLUCOSE-CAPILLARY: 105 mg/dL — AB (ref 65–99)
Glucose-Capillary: 96 mg/dL (ref 65–99)
Glucose-Capillary: 107 mg/dL — ABNORMAL HIGH (ref 65–99)
Glucose-Capillary: 174 mg/dL — ABNORMAL HIGH (ref 65–99)
Glucose-Capillary: 223 mg/dL — ABNORMAL HIGH (ref 65–99)

## 2015-07-10 LAB — CBC
HEMATOCRIT: 37 % (ref 36.0–46.0)
HEMOGLOBIN: 10.7 g/dL — AB (ref 12.0–15.0)
MCH: 23.3 pg — AB (ref 26.0–34.0)
MCHC: 28.9 g/dL — AB (ref 30.0–36.0)
MCV: 80.6 fL (ref 78.0–100.0)
Platelets: 283 10*3/uL (ref 150–400)
RBC: 4.59 MIL/uL (ref 3.87–5.11)
RDW: 26.8 % — ABNORMAL HIGH (ref 11.5–15.5)
WBC: 5.3 10*3/uL (ref 4.0–10.5)

## 2015-07-10 LAB — CULTURE, BLOOD (ROUTINE X 2)

## 2015-07-10 MED ORDER — SODIUM CHLORIDE 0.9 % IV SOLN
3.0000 g | Freq: Three times a day (TID) | INTRAVENOUS | Status: DC
  Administered 2015-07-10 – 2015-07-11 (×2): 3 g via INTRAVENOUS
  Filled 2015-07-10 (×4): qty 3

## 2015-07-10 MED ORDER — INSULIN ASPART 100 UNIT/ML ~~LOC~~ SOLN
0.0000 [IU] | Freq: Three times a day (TID) | SUBCUTANEOUS | Status: DC
  Administered 2015-07-10: 3 [IU] via SUBCUTANEOUS

## 2015-07-10 NOTE — Clinical Documentation Improvement (Signed)
Hospitalist  (Query responses must be documented in the progress notes and discharge summary, not on the CDI BPA itself.  Responses documented on the CDI BPA are not codeable because BPA's are not part of the permanent medical record.)  "NSTEMI" and "Demand Ischemia" are documented interchangeably in the current medical record.  In order to assist with accurate assignment, please clarify the appropriate diagnosis for this admission:  NSTEMI or Demand Ischemia.   Please exercise your independent, professional judgment when responding. A specific answer is not anticipated or expected.   Thank You, Erling Conte  RN BSN CCDS [phone] Health Information Management [place]

## 2015-07-10 NOTE — Progress Notes (Signed)
SLP Cancellation Note  Patient Details Name: [name] MRN: [id number] DOB: 05/19/1935   Cancelled treatment:       Reason Eval/Treat Not Completed: Other (comment) MD in room - will return as able.   [name], M.A. CCC-SLP [phone]  [name] 07/10/2015, 11:57 AM

## 2015-07-10 NOTE — Progress Notes (Signed)
   07/10/15 0000  Clinical Encounter Type  Visited With Patient  Visit Type Spiritual support  Referral From Nurse  Spiritual Encounters  Spiritual Needs Prayer;Emotional  Stress Factors  Patient Stress Factors Family relationships;Exhausted;Health changes;Loss of control  Nurse requested chaplain visit because patient had expressed desire not to live any longer due to being a burden on family. Chaplain heard her concerns and prayed with her for her family; patient gradually began to smile as she told stories of her twins. Prayed again for her to receive the love and care of her children with God's grace.

## 2015-07-10 NOTE — Progress Notes (Signed)
Speech Language Pathology Treatment: Dysphagia  Patient Details Name: [name] MRN: [id number] DOB: October 07, 1934 Today's Date: 07/10/2015 Time: [id number] SLP Time Calculation (min) (ACUTE ONLY): 8 min  Assessment / Plan / Recommendation Clinical Impression  Pt agreeable to small amounts of PO intake, now with top and bottom dentures in place. No overt signs of aspiration observed and only mildly prolonged mastication noted with soft solids. She voices her distaste for the pureed foods, and it appears as though she only ate her mashed potatoes and her pudding off of her lunch tray. Will advance diet to mechanical soft solids and nectar thick liquids. Will continue to follow for tolerance.   HPI HPI: 80 y.o. female with PMH of hypertension, diabetes mellitus, GERD, anxiety, IBD, UC, GI bleeding, severe MS with quadriplegia and difficulty speaking, who presents with productive cough and shortness of breath and questionable facial drooop from SNF. In ED, patient was found to have positive urinalysis for UTI, positive troponin. Chest x-ray showed patchy left base airspace disease, early infection or aspiration cannot be excluded.  Previous MBS 2013 revealed pharyngeal stasis, no penetration or aspiration, Dys 3 thin liquids recommended. RN received report pt is on thick liquids at SNF although pt denies and states she drinks thin liquids. Difficult to assess safety with thin liquids with sole bedside swallow.        SLP Plan  Continue with current plan of care     Recommendations  Diet recommendations: Dysphagia 3 (mechanical soft);Nectar-thick liquid Liquids provided via: Cup;Straw Medication Administration: Crushed with puree Supervision: Staff to assist with self feeding;Full supervision/cueing for compensatory strategies Compensations: Slow rate;Small sips/bites Postural Changes and/or Swallow Maneuvers: Seated upright 90 degrees;Upright 30-60 min after meal              Oral Care  Recommendations: Oral care BID Follow up Recommendations: Skilled Nursing facility Plan: Continue with current plan of care   [name], M.A. CCC-SLP [phone]  [name] 07/10/2015, 2:59 PM

## 2015-07-10 NOTE — Progress Notes (Signed)
Shift event note:  Notified just after shift change (1905) by Dr Sheran Fava regarding change in pt's status. RN reported pt was found unresponsive. Request I evaluate pt as there is concern for stroke. Spoke w/ RN by phone who confirmed pt is unresponsive to multiple deep sternal rubs. Otherwise pt is maintaining her airway and VSS. RR RN is at the bedside and an ABG has been obtained.  Order placed for stat ct head w/o CM. Plan was to meet pt in Ct scanner. When she did not arrive I called RN who reported that pt was now awake and oriented. At bedside pt noted w/ eyes open and is oriented x 4 and is able to recall the events of the day. Exam was w/o new focal findings on limited neuro exam. Ct head obtained and was w/o acute findings. Pt had not received any sedating medications during the day. ABG results revealed pH- of 7.26 and pC02 of 67.6. Pt was placed on BiPAP but demanded it be removed at approx 2345 stating it hurt her face. A repeat ABG slightly improved but pt does not want to be placed back on BiPAP. Unable to keep sats greater than 86% on 4-6L 02 via [place]. Went back to bedside to discuss the rationale for the BiPAP and to discuss code status. Pt seemed to be very cognizant of the information and confirms she would want intubation and CPR if necessary. Pt did finally agree to being placed back on BiPAP w/ low dose Morphine to help with "face pain". At approx 0500 pt requested to be taken off BiPAP after tolerating well for approx 2 hours. She has remained alert and oriented. Will place back on [place] and repeat ABG.  Assessment/Plan: 1. Acute hypercapneic/hypoxic respiratory failure: Likely related to HCAP vs aspiration PNA. ? Progression of MS. Continue IV Vanc/Zosyn. PRN nebs. Follow repeat ABG.  2. Decreased LOC: Resolved. Most likely associated w/ hypercapnea. Ct head w/o acute findings. Has remains easily aroused and oriented. Will continue to monitor closely SDU.  Will defer further changes to plan of  care to rounding MD.   Jeryl Columbia, NP-C [facility] Pager [phone]

## 2015-07-10 NOTE — Care Management Important Message (Signed)
Important Message  Patient Details  Name: [name] MRN: [id number] Date of Birth: [birth year]   Medicare Important Message Given:  Yes    Nathen May 07/10/2015, 3:39 PM

## 2015-07-10 NOTE — Progress Notes (Addendum)
[facility] PROGRESS NOTE  LYNLEA HAUER [id number] DOB: [birth year] DOA: 07/07/2015 PCP: Blanchie Serve, MD  Brief Summary  [name] is a 80 y.o. female with PMH of hypertension, diabetes mellitus, GERD, anxiety, IBD, UC, GI bleeding, severe MS with quadriplegia and difficulty speaking, who presented with productive cough and shortness of breath.  Patient is from a nursing home.  She was brought initially because of possible right facial droop. Per report, pt was last seen normal at 1400. Staff rounded at 1530 and pt was noticed to have possible right sided facial droop and seemed lethargic. Upon arrival to ED, pt did not have facial droop, but had shortness of breath and productive cough. She was hypotensive to 85/54, which improved to 117/72 after IV fluid. Patient denied symptoms for UTI, abdominal pain, diarrhea. No chest pain currently.  In ED, Urinalysis was consistent with UTI, positive troponin 0.31-->1.23, lactate 1.8, WBC 8.7, temperature normal, tachycardia, tachypnea, oxygen desaturations to 70% on room air. Chest x-ray showed possible patchy left base airspace disease. Patient submitted 3 inpatient for further eval and treatment.  Assessment/Plan  Acute respiratory failure with hypoxia and hypercarbia ([place]): this is likely due to HCAP vs. aspiration pneumonia.  I suspect she may have some progression of her MS.  - d/c vanc and zosyn and change to unasyn - Mucinex for cough  - Xopenex Neb prn for SOB - Urine legionella and S. pneumococcal antigen both negative -  Sputum inadequate sample -  VQ scan low probability for PE -  Appreciate speech therapy assistance  Coag neg staph blood culture, contaminant -  D/c vancomycin   E. Coli UTI, present at time of admission - f/u sensitivities -  Changing to unasyn in meantime  Sepsis 2/2 UTI and PNA: Patient meets criteria for sepsis with tachycardia, tachypnea and hypotension. Blood pressure responded to IV fluids. Currently  hemodynamically stable and improving  Demand ischemia: trop 0.31-->1.23 --> 1.12.  Secondary to sepsis. EKG stable - continue ASA 81 mg, Nitroglycerin, metoprolol, and lipitor  - Risk factor stratification: FLP LDL >100, A1C 6 - 2d echo:  60-65% EF, probable elevated end-diastolic filling pressures, no focal wall motion abnormalities - Cardiology consult appreciated  Chronic diastolic congestive heart failure: 2-D echo on 09/04/09 showed a year 70-75% with grade 1 diastolic dysfunction. BNP 36.9. CHF is compensated on admission. -Continue metoprolol  MS (multiple sclerosis) ([place]): No acute issues. -Observe closely  GERD: -Protonix  HTN with low normal blood pressures yesterday which have been rising -continue metoprolol -Hold Lisinopril  Ulcerative colitis (HCC) and IBS: stable -continue sulfadiazine  DM-II: Last A1c 6.9 on 02/26/15, well controled. Patient is taking metformin and Tradjenta at home -SSI > change to with meals - A1c 6  Diet:  Dysphagia 1  Access:  PIV IVF:  off Proph:  lovenox  Code Status:  confirmed DO NOT RESUSCITATE/DO NOT INTUBATE with patient and her daughters Family Communication:  Filled out MOST form with patient and her daughters and placed on chart. Disposition Plan:  Anticipate back to SNF in a few days if she does not have further episodes of hypercapneic somnolence and once her O2 requirements have decreased.    Consultants:  Cardiology  Procedures:  ECHO  Antibiotics:  vanc and zosyn 12/27 > 12/30  unasyn 12/30 >   HPI/Subjective:  Cough improved.  She does not remember our conversations from yesterday or today when I came back to talk to her family.  Denies chest pain, nausea, worsening  weakness  Objective: Filed Vitals:   07/10/15 0405 07/10/15 0814 07/10/15 0845 07/10/15 1235  BP: 127/56 126/67  147/59  Pulse: 87 95 98 79  Temp: 98.3 F (36.8 C) 97.5 F (36.4 C)  97.4 F (36.3 C)  TempSrc: Oral Oral  Oral  Resp: 23 24  22 24   Height:      Weight:      SpO2: 91% 94% 86% 96%    Intake/Output Summary (Last 24 hours) at 07/10/15 1539 Last data filed at 07/10/15 0407  Gross per 24 hour  Intake    940 ml  Output      0 ml  Net    940 ml   Filed Weights   07/07/15 1752  Weight: 56.246 kg (124 lb)   Body mass index is 20.63 kg/(m^2).  Exam:   General:  Adult female, No acute distress, awake and alert:    Cardiovascular:  RRR, nl S1, S2 no mrg, 2+ pulses, warm extremities  Respiratory:  CTAB anteriorly, no increased WOB  Abdomen:   NABS, soft, NT/ND  MSK:   Decreased tone and severely decreased bulk, 1+ pitting bilateral LEE  Neuro:  quadriplegia  Data Reviewed: Basic Metabolic Panel:  Recent Labs Lab 07/07/15 1805 07/08/15 0720 07/09/15 0555 07/10/15 0430  NA 145 142 141 143  K 4.7 4.1 4.3 5.2*  CL 105 106 106 110  CO2 31 29 21* 19*  GLUCOSE 154* 122* 107* 92  BUN 12 9 8 6   CREATININE 0.46 0.33* 0.36* 0.40*  CALCIUM 10.0 8.8* 9.1 9.3   Liver Function Tests:  Recent Labs Lab 07/07/15 1805  AST 23  ALT 11*  ALKPHOS 63  BILITOT 0.1*  PROT 7.6  ALBUMIN 3.5   No results for input(s): LIPASE, AMYLASE in the last 168 hours. No results for input(s): AMMONIA in the last 168 hours. CBC:  Recent Labs Lab 07/07/15 1805 07/09/15 0550 07/10/15 0700  WBC 8.7 6.9 5.3  NEUTROABS 5.2  --   --   HGB 14.1 10.6* 10.7*  HCT 49.5* 37.7 37.0  MCV 83.1 84.0 80.6  PLT PLATELET CLUMPS NOTED ON SMEAR, COUNT APPEARS ADEQUATE 243 283    Recent Results (from the past 240 hour(s))  Blood Culture (routine x 2)     Status: None (Preliminary result)   Collection Time: 07/07/15  6:05 PM  Result Value Ref Range Status   Specimen Description BLOOD LEFT HAND  Final   Special Requests BOTTLES DRAWN AEROBIC AND ANAEROBIC 5CC  Final   Culture NO GROWTH 3 DAYS  Final   Report Status PENDING  Incomplete  Blood Culture (routine x 2)     Status: None   Collection Time: 07/07/15  6:07 PM  Result  Value Ref Range Status   Specimen Description BLOOD LEFT ANTECUBITAL  Final   Special Requests BOTTLES DRAWN AEROBIC AND ANAEROBIC 5CC  Final   Culture  Setup Time   Final    GRAM POSITIVE COCCI IN CLUSTERS ANAEROBIC BOTTLE ONLY CRITICAL RESULT CALLED TO, READ BACK BY AND VERIFIED WITH: B DALTON RN 1917 07/08/15 A BROWNING    Culture   Final    STAPHYLOCOCCUS SPECIES (COAGULASE NEGATIVE) THE SIGNIFICANCE OF ISOLATING THIS ORGANISM FROM A SINGLE SET OF BLOOD CULTURES WHEN MULTIPLE SETS ARE DRAWN IS UNCERTAIN. PLEASE NOTIFY THE MICROBIOLOGY DEPARTMENT WITHIN ONE WEEK IF SPECIATION AND SENSITIVITIES ARE REQUIRED.    Report Status 07/10/2015 FINAL  Final  Urine culture     Status: None (Preliminary result)  Collection Time: 07/07/15  7:14 PM  Result Value Ref Range Status   Specimen Description URINE, CATHETERIZED  Final   Special Requests NONE  Final   Culture   Final    >=100,000 COLONIES/mL ESCHERICHIA COLI SUSCEPTIBILITIES TO FOLLOW    Report Status PENDING  Incomplete  MRSA PCR Screening     Status: None   Collection Time: 07/07/15 10:39 PM  Result Value Ref Range Status   MRSA by PCR NEGATIVE NEGATIVE Final    Comment:        The GeneXpert MRSA Assay (FDA approved for NASAL specimens only), is one component of a comprehensive MRSA colonization surveillance program. It is not intended to diagnose MRSA infection nor to guide or monitor treatment for MRSA infections.   Culture, sputum-assessment     Status: None   Collection Time: 07/07/15 11:50 PM  Result Value Ref Range Status   Specimen Description EXPECTORATED SPUTUM  Final   Special Requests NONE  Final   Sputum evaluation   Final    MICROSCOPIC FINDINGS SUGGEST THAT THIS SPECIMEN IS NOT REPRESENTATIVE OF LOWER RESPIRATORY SECRETIONS. PLEASE RECOLLECT. Gram Stain Report Called to,Read Back By and Verified With: DENISE @0209  07/08/15 MKELLY    Report Status 07/08/2015 FINAL  Final     Studies: Ct Head Wo  Contrast  07/08/2015  CLINICAL DATA:  Acute onset of altered mental status tonight. EXAM: CT HEAD WITHOUT CONTRAST TECHNIQUE: Contiguous axial images were obtained from the base of the skull through the vertex without intravenous contrast. COMPARISON:  CT scan dated 10/29/2007 FINDINGS: No mass lesion. No midline shift. No acute hemorrhage or hematoma. No extra-axial fluid collections. No evidence of acute infarction. There is diffuse slight cerebral cortical atrophy. Slight periventricular lucency in the frontal lobes, left more than right, chronic and unchanged. There are new air-fluid levels in the sphenoid sinus. The osseous structures otherwise are unchanged with prominent chronic hyperostosis frontalis interna. IMPRESSION: 1. No acute abnormality of the brain. Slight atrophy with chronic slight white matter disease in the frontal lobes, probably small vessel ischemic change. 2. New small air-fluid levels in the sphenoid sinus, nonspecific. Electronically Signed   By: Lorriane Shire M.D.   On: 07/08/2015 20:31   Nm Pulmonary Perf And Vent  07/09/2015  CLINICAL DATA:  Hypoxia/respiratory failure EXAM: NUCLEAR MEDICINE VENTILATION - PERFUSION LUNG SCAN Views: Anterior, posterior, RPO, LPO, RAO, LAO -ventilation and perfusion Radionuclide: Technetium 21m DTPA -ventilation; Technetium 12m macroaggregated albumin-perfusion Dose:  [id number] millicurie-ventilation; 4.2 millicurie-perfusion Route of administration: Inhalation -ventilation ; intravenous -perfusion COMPARISON:  Chest radiograph July 07, 2015 FINDINGS: Ventilation: Radiotracer uptake bilaterally is homogeneous and symmetric. No ventilation defects appreciable. Perfusion: Radiotracer uptake bilaterally is homogeneous and symmetric. No perfusion defects appreciable. IMPRESSION: No appreciable ventilation or perfusion defects. This study constitutes a very low probability of pulmonary embolus. Electronically Signed   By: Lowella Grip III M.D.   On:  07/09/2015 13:40    Scheduled Meds: . aspirin EC  81 mg Oral Daily  . atorvastatin  40 mg Oral q1800  . enoxaparin (LOVENOX) injection  40 mg Subcutaneous Q24H  . guaiFENesin  600 mg Oral BID  . insulin aspart  0-9 Units Subcutaneous TID WC  . levalbuterol  1.25 mg Nebulization TID  . loratadine  10 mg Oral Daily  . metoprolol tartrate  12.5 mg Oral Daily  . pantoprazole sodium  40 mg Per Tube Daily  . sulfaDIAZINE  1,000 mg Oral Q12H  . vitamin B-12  100 mcg  Oral Daily  . zinc oxide   Topical Daily   Continuous Infusions:    Principal Problem:   Acute respiratory failure with hypoxia and hypercarbia (HCC) Active Problems:   MS (multiple sclerosis) (HCC)   Ulcerative colitis (HCC)   GERD (gastroesophageal reflux disease)   Essential hypertension, benign   Anxiety state   Diabetes mellitus type II, controlled, with no complications (HCC)   UTI (lower urinary tract infection)   Demand ischemia (HCC)   Chronic diastolic CHF (congestive heart failure) ([place])   Sepsis ([place])   Aspiration pneumonia ([place])   HCAP (healthcare-associated pneumonia)    Time spent: 30 min    Avrum Kimball, [facility] Pager [phone]. If 7PM-7AM, please contact night-coverage at [email or web address], password [facility] 07/10/2015, 3:39 PM  LOS: 3 days

## 2015-07-10 NOTE — Progress Notes (Signed)
PROGRESS NOTE  Subjective:   80 y.o. female with PMH of hypertension, diabetes mellitus, GERD, anxiety, IBD, UC, GI bleeding, severe MS with quadriplegia and difficulty speaking, who presented with productive cough and shortness of breath  She denies any chest pain  Has hypoxia and hypercardia. ? Aspiration pneumonia vs. HCAP   Objective:    Vital Signs:   Temp:  [97.3 F (36.3 C)-98.3 F (36.8 C)] 97.4 F (36.3 C) (12/30 1235) Pulse Rate:  [79-98] 79 (12/30 1235) Resp:  [14-24] 24 (12/30 1235) BP: (124-147)/(56-67) 147/59 mmHg (12/30 1235) SpO2:  [86 %-97 %] 96 % (12/30 1235)  Last BM Date: 07/09/15   24-hour weight change: Weight change:   Weight trends: Filed Weights   07/07/15 1752  Weight: 124 lb (56.246 kg)    Intake/Output:  12/29 0701 - 12/30 0700 In: [id number] [P.O.:600; I.V.:81; IV Piggyback:700] Out: -      Physical Exam: BP 147/59 mmHg  Pulse 79  Temp(Src) 97.4 F (36.3 C) (Oral)  Resp 24  Ht 5\' 5"  (1.651 m)  Wt 124 lb (56.246 kg)  SpO2 96%  Wt Readings from Last 3 Encounters:  07/07/15 124 lb (56.246 kg)  06/01/15 124 lb 8 oz (56.473 kg)  04/15/15 123 lb 12.8 oz (56.155 kg)    General: Vital signs reviewed and noted. Bedridden.   quadraplegic   Head: Normocephalic, atraumatic.  Eyes: conjunctivae/corneas clear.  EOM's intact.   Throat: normal  Neck:  normal   Lungs:    clear anteriorly  Heart:  RR   Abdomen:  Soft, non-tender, non-distended    Extremities: No edema    Neurologic: Quadraplegic.   Psych: Normal     Labs: BMET:  Recent Labs  07/09/15 0555 07/10/15 0430  NA 141 143  K 4.3 5.2*  CL 106 110  CO2 21* 19*  GLUCOSE 107* 92  BUN 8 6  CREATININE 0.36* 0.40*  CALCIUM 9.1 9.3    Liver function tests:  Recent Labs  07/07/15 1805  AST 23  ALT 11*  ALKPHOS 63  BILITOT 0.1*  PROT 7.6  ALBUMIN 3.5   No results for input(s): LIPASE, AMYLASE in the last 72 hours.  CBC:  Recent Labs  07/07/15 1805  07/09/15 0550 07/10/15 0700  WBC 8.7 6.9 5.3  NEUTROABS 5.2  --   --   HGB 14.1 10.6* 10.7*  HCT 49.5* 37.7 37.0  MCV 83.1 84.0 80.6  PLT PLATELET CLUMPS NOTED ON SMEAR, COUNT APPEARS ADEQUATE 243 283    Cardiac Enzymes:  Recent Labs  07/07/15 2049 07/08/15 0309 07/08/15 0720  TROPONINI 1.23* 1.12* 0.99*    Coagulation Studies:  Recent Labs  07/07/15 2049  LABPROT 15.8*  INR 1.24    Other: Invalid input(s): POCBNP  Recent Labs  07/07/15 1939  DDIMER 1.29*    Recent Labs  07/08/15 0720  HGBA1C 6.0*    Recent Labs  07/08/15 0720  CHOL 154  HDL 24*  LDLCALC 101*  TRIG 144  CHOLHDL 6.4   No results for input(s): TSH, T4TOTAL, T3FREE, THYROIDAB in the last 72 hours.  Invalid input(s): FREET3 No results for input(s): VITAMINB12, FOLATE, FERRITIN, TIBC, IRON, RETICCTPCT in the last 72 hours.   Other results:  EKG  ( personally reviewed )  - NSR , no acute changes  Medications:    Infusions:    Scheduled Medications: . ampicillin-sulbactam (UNASYN) IV  3 g Intravenous Q8H  . aspirin EC  81 mg Oral  Daily  . atorvastatin  40 mg Oral q1800  . enoxaparin (LOVENOX) injection  40 mg Subcutaneous Q24H  . guaiFENesin  600 mg Oral BID  . insulin aspart  0-9 Units Subcutaneous TID WC  . levalbuterol  1.25 mg Nebulization TID  . loratadine  10 mg Oral Daily  . metoprolol tartrate  12.5 mg Oral Daily  . pantoprazole sodium  40 mg Per Tube Daily  . sulfaDIAZINE  1,000 mg Oral Q12H  . vitamin B-12  100 mcg Oral Daily  . zinc oxide   Topical Daily    Assessment/ Plan:   Principal Problem:   Acute respiratory failure with hypoxia and hypercarbia (HCC) Active Problems:   MS (multiple sclerosis) (HCC)   Ulcerative colitis (HCC)   GERD (gastroesophageal reflux disease)   Essential hypertension, benign   Anxiety state   Diabetes mellitus type II, controlled, with no complications (HCC)   UTI (lower urinary tract infection)   Demand ischemia (HCC)    Chronic diastolic CHF (congestive heart failure) ([place])   Sepsis (HCC)   Aspiration pneumonia ([place])   HCAP (healthcare-associated pneumonia)  1. Troponin elevation - likely due to her pneumonia or demand ischemia from her pneumonia.  She has not had any chest pain .   She is not a good candidate for interventional procedures and she is not having any angina so I do not think further stress testing is indicated.   Continue with medical therapy  Will sign off. Call for questions     Disposition:  Length of Stay: 3  Thayer Headings, Brooke Bonito., MD, [facility] 07/10/2015, 4:05 PM Office [phone] Pager [phone]

## 2015-07-10 NOTE — Progress Notes (Addendum)
Pharmacy Antibiotic Follow-up Note  [name] is a 80 y.o. year-old female admitted on 07/07/2015.  The patient is currently on day 3 of antibiotics for aspiration PNA. Transitioning from vancomycin and Zosyn to Unasyn today.   Assessment/Plan: Afebrile, WBC normal, likely unable to determine true renal function with severe MS with quadriplegia and low muscle mass.  - Unasyn 3 g IV q8h - consider LOT 7 days total  Temp (24hrs), Avg:97.6 F (36.4 C), Min:97.3 F (36.3 C), Max:98.3 F (36.8 C)   Recent Labs Lab 07/07/15 1805 07/09/15 0550 07/10/15 0700  WBC 8.7 6.9 5.3    Recent Labs Lab 07/07/15 1805 07/08/15 0720 07/09/15 0555 07/10/15 0430  CREATININE 0.46 0.33* 0.36* 0.40*   Estimated Creatinine Clearance: 51.4 mL/min (by C-G formula based on Cr of 0.4).    Allergies  Allergen Reactions  . Contrast Media [Iodinated Diagnostic Agents]     Unknown    Antimicrobials this admission: Vanc 12/28 >> 12/30 Zosyn 12/28 >> 12/30 Unasyn 12/30>>  Levels/dose changes this admission: none  Microbiology results: 12/27 sputum cx - needs recollection  12/27 BCx x2 - CNS 1/2  12/27 UCx - E.coli  12/28 Legionella/Strep pneumo urinary Ag - negative   Thank you for allowing pharmacy to be a part of this patient's care.  [place], Pharm.D., BCPS Clinical Pharmacist Pager: [phone] 07/10/2015 4:04 PM     ====================   Addendum: - pharmacy will sign off as dosage adjustment likely unnecessary given stable renal fxn.  Please reconsult if needed.  Thanks!    Mirian Casco D. Mina Marble, PharmD, BCPS Pager:  [phone] 07/11/2015, 8:21 AM

## 2015-07-11 LAB — GLUCOSE, CAPILLARY
GLUCOSE-CAPILLARY: 179 mg/dL — AB (ref 65–99)
GLUCOSE-CAPILLARY: 113 mg/dL — AB (ref 65–99)
GLUCOSE-CAPILLARY: 118 mg/dL — AB (ref 65–99)
Glucose-Capillary: 118 mg/dL — ABNORMAL HIGH (ref 65–99)

## 2015-07-11 LAB — CBC
HCT: 36.5 % (ref 36.0–46.0)
HEMOGLOBIN: 10.7 g/dL — AB (ref 12.0–15.0)
MCH: 23.6 pg — AB (ref 26.0–34.0)
MCHC: 29.3 g/dL — ABNORMAL LOW (ref 30.0–36.0)
MCV: 80.4 fL (ref 78.0–100.0)
Platelets: 262 10*3/uL (ref 150–400)
RBC: 4.54 MIL/uL (ref 3.87–5.11)
RDW: 26.8 % — ABNORMAL HIGH (ref 11.5–15.5)
WBC: 4.8 10*3/uL (ref 4.0–10.5)

## 2015-07-11 LAB — BASIC METABOLIC PANEL
ANION GAP: 7 (ref 5–15)
BUN: <5 mg/dL — ABNORMAL LOW (ref 6–20)
CALCIUM: 8.9 mg/dL (ref 8.9–10.3)
CHLORIDE: 107 mmol/L (ref 101–111)
CO2: 28 mmol/L (ref 22–32)
Creatinine, Ser: 0.36 mg/dL — ABNORMAL LOW (ref 0.44–1.00)
GFR calc non Af Amer: >60 mL/min (ref >60)
Glucose, Bld: 121 mg/dL — ABNORMAL HIGH (ref 65–99)
Potassium: 3.4 mmol/L — ABNORMAL LOW (ref 3.5–5.1)
SODIUM: 142 mmol/L (ref 135–145)

## 2015-07-11 LAB — TROPONIN I: TROPONIN I: 0.37 ng/mL — AB (ref <0.031)

## 2015-07-11 MED ORDER — GUAIFENESIN ER 600 MG PO TB12: 600.0000 mg | ORAL_TABLET | Freq: Two times a day (BID) | ORAL | Status: AC | PRN

## 2015-07-11 MED ORDER — METFORMIN HCL 500 MG PO TABS: 500.0000 mg | ORAL_TABLET | Freq: Two times a day (BID) | ORAL | Status: AC

## 2015-07-11 MED ORDER — SACCHAROMYCES BOULARDII 250 MG PO CAPS: 250.0000 mg | ORAL_CAPSULE | Freq: Two times a day (BID) | ORAL | Status: DC

## 2015-07-11 MED ORDER — ASPIRIN 81 MG PO TBEC: 81.0000 mg | DELAYED_RELEASE_TABLET | Freq: Every day | ORAL | Status: AC

## 2015-07-11 MED ORDER — POTASSIUM CHLORIDE CRYS ER 20 MEQ PO TBCR
20.0000 meq | EXTENDED_RELEASE_TABLET | Freq: Once | ORAL | Status: AC
Start: 2015-07-11 — End: 2015-07-11
  Administered 2015-07-11: 20 meq via ORAL
  Filled 2015-07-11: qty 1

## 2015-07-11 MED ORDER — CETIRIZINE HCL 10 MG PO TABS: 10.0000 mg | ORAL_TABLET | Freq: Every evening | ORAL | Status: DC | PRN

## 2015-07-11 MED ORDER — RESOURCE THICKENUP CLEAR PO POWD: ORAL | Status: DC

## 2015-07-11 MED ORDER — FAMOTIDINE 20 MG PO TABS: 20.0000 mg | ORAL_TABLET | Freq: Two times a day (BID) | ORAL | Status: AC | PRN

## 2015-07-11 MED ORDER — AMOXICILLIN-POT CLAVULANATE 875-125 MG PO TABS: 1.0000 | ORAL_TABLET | Freq: Two times a day (BID) | ORAL | Status: DC

## 2015-07-11 MED ORDER — ATORVASTATIN CALCIUM 40 MG PO TABS: 40.0000 mg | ORAL_TABLET | Freq: Every day | ORAL | Status: DC

## 2015-07-11 MED ORDER — NITROGLYCERIN 0.4 MG SL SUBL: 0.4000 mg | SUBLINGUAL_TABLET | SUBLINGUAL | Status: AC | PRN

## 2015-07-11 NOTE — Discharge Summary (Addendum)
Physician Discharge Summary  CHRISHAWNA FARINA BHA:[id number] DOB: Nov 25, 1934 DOA: 07/07/2015  PCP: Blanchie Serve, MD  Admit date: 07/07/2015 Discharge date: 07/11/2015  Recommendations for Outpatient Follow-up:  1. Transfer to SNF for ongoing PT/OT and care 2. Augmentin through 07/13/2014 3. Continue to taper oxygen over next week.  Doubt that she needs long term oxygen.  Currently on 3L 4. Decreased sedating medications secondary to hypercapnea  Discharge Diagnoses:  Principal Problem:   Acute respiratory failure with hypoxia and hypercarbia (HCC) Active Problems:   MS (multiple sclerosis) (HCC)   Ulcerative colitis (HCC)   GERD (gastroesophageal reflux disease)   Essential hypertension, benign   Anxiety state   Diabetes mellitus type II, controlled, with no complications (HCC)   UTI (lower urinary tract infection)   Demand ischemia (HCC)   Chronic diastolic CHF (congestive heart failure) ([place])   Sepsis ([place])   Aspiration pneumonia ([place])   HCAP (healthcare-associated pneumonia)   Discharge Condition: stable, improved  Diet recommendation: dysphagia 3 with nectar thick liquid  Liquids provided via: Cup;Straw Medication Administration: Crushed with puree Supervision: Staff to assist with self feeding;Full supervision/cueing for compensatory strategies Compensations: Slow rate;Small sips/bites Postural Changes and/or Swallow Maneuvers: Seated upright 90 degrees;Upright 30-60 min after meal  Wt Readings from Last 3 Encounters:  07/07/15 56.246 kg (124 lb)  06/01/15 56.473 kg (124 lb 8 oz)  04/15/15 56.155 kg (123 lb 12.8 oz)    History of present illness:   [name] is a 80 y.o. female with PMH of hypertension, diabetes mellitus, GERD, anxiety, IBD, UC, GI bleeding, severe MS with quadriplegia and difficulty speaking, who presented with productive cough and shortness of breath. Patient is from a nursing home. She was brought initially because of possible right facial  droop. Per report, pt was last seen normal at 1400. Staff rounded at 1530 and pt was noticed to have possible right sided facial droop and seemed lethargic. Upon arrival to ED, pt did not have facial droop, but had shortness of breath and productive cough. She was hypotensive to 85/54, which improved to 117/72 after IV fluid. Patient denied symptoms for UTI, abdominal pain, diarrhea. No chest pain currently. In ED, Urinalysis was consistent with UTI, positive troponin 0.31-->1.23, lactate 1.8, WBC 8.7, temperature normal, tachycardia, tachypnea, oxygen desaturations to 70% on room air. Chest x-ray showed possible patchy left base airspace disease.   Hospital Course:   Acute respiratory failure with hypoxia and hypercarbia (HCC) and sepsis likely due to HCAP vs. aspiration pneumonia. I suspect she may have some progression of her MS.  She was initially started on vancomycin and Zosyn for healthcare associated pneumonia. Her VQ scan was low risk for PE. Her Legionella and strep pneumococcal antigens were both negative. She was kept on these antibiotics for an additional day because one of her blood cultures from admission grew gram-positive cocci, however this was determined to be coag negative staph. Once her blood culture with SPECT dated, it was felt to be contaminant, and her vancomycin was discontinued. Her Zosyn was changed to Unasyn for aspiration pneumonia. She completed a seven-day course of antibiotics, transitioning to Augmentin at discharge. She was started on probiotic to reduce the risk of infectious diarrhea. She met with the speech therapist who recommended a dysphagia 3 diet with nectar thick liquid and some aspiration precautions as listed above.  Sepsis 2/2 UTI and PNA: Patient met criteria for sepsis with tachycardia, tachypnea and hypotension. Blood pressure responded to IV fluids. Currently hemodynamically stable.  Coag neg staph blood culture, contaminant.  E. Coli UTI, present at  time of admission.  f/u sensitivities.  Continue Augmentin.  Demand ischemia: trop 0.31-->1.23 --> 1.12. Secondary to sepsis. EKG was without acute ischemic changes. She was started on aspirin, nitroglycerin, and atorvastatin and she continued her metoprolol. Given her age and frailty, with use statins judiciously. Her LDL was greater than 100.  Her hemoglobin A1c was 6. Transthoracic echocardiogram demonstrated an ejection fraction of 60-65% with probable elevated end-diastolic filling pressures and no regional wall motion abnormalities. Cardiology was consulted and agreed with ongoing medical management.   Chronic diastolic congestive heart failure: Echo as above. BNP 36.9. CHF was compensated on admission.  MS (multiple sclerosis) ([place]): No acute issues however this episode of sepsis with shortness of breath may reflect progressive disease. Discussed the possibility of seeing a neurologist as an outpatient for reevaluation and starting medications, however the patient stated that she was more interested in comfort and did not want to pursue this. We discussed CODE STATUS and goals of care in a DO NOT RESUSCITATE was completed as well as a MOST form which were placed on the chart.     GERD:  Stable, changed Protonix to H2 blocker secondary to risk of aspiration .    HTN with low normal blood pressures.  She continue beta blocker secondary to her dismay and ischemia however her lisinopril was discontinued.   Ulcerative colitis ([place]) and IBS: stable, continued sulfadiazine  DM-II: Last A1c 6.9 on 02/26/15, well controled. Patient is taking metformin and Tradjenta at home.  Her hemoglobin A1c was 6.   Iron deficiency anemia, hemoglobin remained stable. Continued iron supplementation.  Consultants:  Cardiology  Procedures:  ECHO  Antibiotics:  vanc and zosyn 12/27 > 12/30  unasyn 12/30 >  Discharge Exam: Filed Vitals:   07/11/15 0800 07/11/15 0919  BP: 127/64 118/61  Pulse: 94 96   Temp: 97.5 F (36.4 C)   Resp: 22    Filed Vitals:   07/11/15 0412 07/11/15 0800 07/11/15 0919 07/11/15 0933  BP: 110/49 127/64 118/61   Pulse: 81 94 96   Temp: 99 F (37.2 C) 97.5 F (36.4 C)    TempSrc: Axillary Oral    Resp: 24 22    Height:      Weight:      SpO2: 99% 96%  94%     General: Adult female, No acute distress, awake and alert  Cardiovascular: RRR, nl S1, S2 no mrg, 2+ pulses, warm extremities  Respiratory: CTAB anteriorly, no increased WOB  Abdomen: NABS, soft, NT/ND  MSK: Decreased tone and severely decreased bulk, 1+ pitting bilateral LEE  Neuro: quadriplegia  Discharge Instructions      Discharge Instructions    Call MD for:  difficulty breathing, headache or visual disturbances    Complete by:  As directed      Call MD for:  extreme fatigue    Complete by:  As directed      Call MD for:  hives    Complete by:  As directed      Call MD for:  persistant dizziness or light-headedness    Complete by:  As directed      Call MD for:  persistant nausea and vomiting    Complete by:  As directed      Call MD for:  severe uncontrolled pain    Complete by:  As directed      Call MD for:  temperature >100.4  Complete by:  As directed      Diet - low sodium heart healthy    Complete by:  As directed      Increase activity slowly    Complete by:  As directed             Medication List    STOP taking these medications        gabapentin 100 MG capsule  Commonly known as:  NEURONTIN     lisinopril 2.5 MG tablet  Commonly known as:  PRINIVIL,ZESTRIL     pantoprazole 40 MG tablet  Commonly known as:  PROTONIX     traZODone 50 MG tablet  Commonly known as:  DESYREL      TAKE these medications        acetaminophen 325 MG tablet  Commonly known as:  TYLENOL  Take 650 mg by mouth every 4 (four) hours as needed.     amoxicillin-clavulanate 875-125 MG tablet  Commonly known as:  AUGMENTIN  Take 1 tablet by mouth 2 (two) times  daily.     aspirin 81 MG EC tablet  Take 1 tablet (81 mg total) by mouth daily.     atorvastatin 40 MG tablet  Commonly known as:  LIPITOR  Take 1 tablet (40 mg total) by mouth daily at 6 PM.     cetirizine 10 MG tablet  Commonly known as:  ZYRTEC  Take 1 tablet (10 mg total) by mouth at bedtime as needed for allergies or rhinitis. Take one tablet by mouth every night at bedtime for allergic rhinitis     cyanocobalamin 100 MCG tablet  Take 1 tablet (100 mcg total) by mouth daily.     famotidine 20 MG tablet  Commonly known as:  PEPCID  Take 1 tablet (20 mg total) by mouth 2 (two) times daily as needed for heartburn or indigestion.     ferrous sulfate 325 (65 FE) MG EC tablet  Take 325 mg by mouth 3 (three) times daily with meals.     guaiFENesin 600 MG 12 hr tablet  Commonly known as:  MUCINEX  Take 1 tablet (600 mg total) by mouth 2 (two) times daily as needed for cough or to loosen phlegm.     linagliptin 5 MG Tabs tablet  Commonly known as:  TRADJENTA  Take 1 tablet (5 mg total) by mouth daily.     metFORMIN 500 MG tablet  Commonly known as:  GLUCOPHAGE  Take 1 tablet (500 mg total) by mouth 2 (two) times daily with a meal. Take one tablet by mouth twice daily to control blood sugar     metoprolol tartrate 25 MG tablet  Commonly known as:  LOPRESSOR  Take 1/2 tablet by mouth daily for blood pressure. HOLD for SBP <110     nitroGLYCERIN 0.4 MG SL tablet  Commonly known as:  NITROSTAT  Place 1 tablet (0.4 mg total) under the tongue every 5 (five) minutes as needed for chest pain.     polyethylene glycol packet  Commonly known as:  MIRALAX / GLYCOLAX  Mix powder in 4-8 oz of liquid once daily for constipation     RESOURCE THICKENUP CLEAR Powd  Nectar thick     saccharomyces boulardii 250 MG capsule  Commonly known as:  FLORASTOR  Take 1 capsule (250 mg total) by mouth 2 (two) times daily.     sulfaDIAZINE 500 MG tablet  Take two tablets by mouth twice daily with  H2O for IBS  zinc oxide 11.3 % Crea cream  Commonly known as:  BALMEX  Apply cream topically to groin and buttock every shift as preventative care       Follow-up Information    Follow up with [facility], MAHIMA, MD. Schedule an appointment as soon as possible for a visit in 1 month.   Specialty:  Internal Medicine   Contact information:   [place] [address] [phone]       Follow up with [facility] .   Specialty:  [place] information:   [address] [place] [phone]       The results of significant diagnostics from this hospitalization (including imaging, microbiology, ancillary and laboratory) are listed below for reference.    Significant Diagnostic Studies: Ct Head Wo Contrast  07/08/2015  CLINICAL DATA:  Acute onset of altered mental status tonight. EXAM: CT HEAD WITHOUT CONTRAST TECHNIQUE: Contiguous axial images were obtained from the base of the skull through the vertex without intravenous contrast. COMPARISON:  CT scan dated 10/29/2007 FINDINGS: No mass lesion. No midline shift. No acute hemorrhage or hematoma. No extra-axial fluid collections. No evidence of acute infarction. There is diffuse slight cerebral cortical atrophy. Slight periventricular lucency in the frontal lobes, left more than right, chronic and unchanged. There are new air-fluid levels in the sphenoid sinus. The osseous structures otherwise are unchanged with prominent chronic hyperostosis frontalis interna. IMPRESSION: 1. No acute abnormality of the brain. Slight atrophy with chronic slight white matter disease in the frontal lobes, probably small vessel ischemic change. 2. New small air-fluid levels in the sphenoid sinus, nonspecific. Electronically Signed   By: Lorriane Shire M.D.   On: 07/08/2015 20:31   Nm Pulmonary Perf And Vent  07/09/2015  CLINICAL DATA:  Hypoxia/respiratory failure EXAM: NUCLEAR  MEDICINE VENTILATION - PERFUSION LUNG SCAN Views: Anterior, posterior, RPO, LPO, RAO, LAO -ventilation and perfusion Radionuclide: Technetium 60mDTPA -ventilation; Technetium 928macroaggregated albumin-perfusion Dose:  3033.3illicurie-ventilation; 4.2 millicurie-perfusion Route of administration: Inhalation -ventilation ; intravenous -perfusion COMPARISON:  Chest radiograph July 07, 2015 FINDINGS: Ventilation: Radiotracer uptake bilaterally is homogeneous and symmetric. No ventilation defects appreciable. Perfusion: Radiotracer uptake bilaterally is homogeneous and symmetric. No perfusion defects appreciable. IMPRESSION: No appreciable ventilation or perfusion defects. This study constitutes a very low probability of pulmonary embolus. Electronically Signed   By: WiLowella GripII M.D.   On: 07/09/2015 13:40   Dg Chest Port 1 View  07/07/2015  CLINICAL DATA:  Right-sided facial droop and lethargy.  Hypoxia. EXAM: PORTABLE CHEST 1 VIEW COMPARISON:  09/07/2012 FINDINGS: Midline trachea. Cardiomegaly accentuated by AP portable technique. Atherosclerosis in the transverse aorta. No pleural effusion or pneumothorax. Low lung volumes with resultant pulmonary interstitial prominence. Patchy left base airspace disease IMPRESSION: Low lung volumes. Patchy left base airspace disease is favored to represent atelectasis superimposed upon scarring. Early infection or aspiration cannot be excluded. Cardiomegaly with aortic atherosclerosis. Electronically Signed   By: KyAbigail Miyamoto.D.   On: 07/07/2015 18:42   Dg Swallowing Func-speech Pathology  07/08/2015  Objective Swallowing Evaluation:   MBS Patient Details Name: MaQUANTISHA MARSICANORN: [id number] of Birth: [id number]'s Date: 07/08/2015 Time: SLP Start Time (ACUTE ONLY): 1000-SLP Stop Time (ACUTE ONLY): 1017 SLP Time Calculation (min) (ACUTE ONLY): 17 min Past Medical History: Past Medical History Diagnosis Date . Hypertension  . Diabetes mellitus  . GERD  (gastroesophageal reflux disease)  . Neuromuscular disorder ([place] .  Multiple sclerosis ([place])  . Anxiety  . Hyperlipemia  . Pneumonia  . Colitis  . Unable to ambulate  . GI bleed 09/07/2012 . Type II or unspecified type diabetes mellitus without mention of complication, uncontrolled 02/03/2013 . Ulcerative colitis, unspecified 02/03/2013 . Chronic diastolic CHF (congestive heart failure) [facility])  Past Surgical History: Past Surgical History Procedure Laterality Date . Tubal ligation   . Flexible sigmoidoscopy  10/05/2011   Procedure: FLEXIBLE SIGMOIDOSCOPY;  Surgeon: Winfield Cunas., MD;  Location: [facility];  Service: Endoscopy;  Laterality: N/A;  pt. coming from [place] daughter phone no. [phone] might come with her sedation is needed, pt. is paraplegic . Esophagogastroduodenoscopy N/A 09/09/2012   Procedure: ESOPHAGOGASTRODUODENOSCOPY (EGD);  Surgeon: Winfield Cunas., MD;  Location: [facility] ENDOSCOPY;  Service: Endoscopy;  Laterality: N/A; . Colonoscopy N/A 09/09/2012   Procedure: COLONOSCOPY;  Surgeon: Winfield Cunas., MD;  Location: [facility] ENDOSCOPY;  Service: Endoscopy;  Laterality: N/A;  possible flex sig HPI: 80 y.o. female with PMH of hypertension, diabetes mellitus, GERD, anxiety, IBD, UC, GI bleeding, severe MS with quadriplegia and difficulty speaking, who presents with productive cough and shortness of breath and questionable facial drooop from SNF. In ED, patient was found to have positive urinalysis for UTI, positive troponin. Chest x-ray showed patchy left base airspace disease, early infection or aspiration cannot be excluded.  Previous MBS 2013 revealed pharyngeal stasis, no penetration or aspiration, Dys 3 thin liquids recommended. RN received report pt is on thick liquids at SNF although pt denies and states she drinks thin liquids. Difficult to assess safety with thin liquids with sole bedside swallow.   No Data Recorded Assessment / Plan / Recommendation CHL IP  CLINICAL IMPRESSIONS 07/08/2015 Therapy Diagnosis -- Clinical Impression Decreased oral cohesion and mildly delayed transit due to weakness. Mod-severe sensory>motor pharyngeal dysphagia. Pt lacks head control therefore swallow function assessed in both flexed and extended (more supine) positions. Most swallows were initiated at the level of the pyriform sinuses due to decreased sensation. Laryngeal penetration, flash with initial trial thin and silently aspirated in head extension and semi upright position using straw with thin. Decreased cervical esophageal pressures led to barium backflow/residuals to pyriform sinuses. Aspiration is significantly increased as pt is a dependent feeder and dependent on staff for proper head and trunk positioning. SLP recommends Dys 1 diet (due to dentures not present) and nectar thick liquids, upright as possible and head in neutral, small sips, straw allowed and pills whole in applesauce.    Impact on safety and function Severe aspiration risk   CHL IP TREATMENT RECOMMENDATION 07/08/2015 Treatment Recommendations Therapy as outlined in treatment plan below   Prognosis 07/08/2015 Prognosis for Safe Diet Advancement Fair Barriers to Reach Goals Cognitive deficits;Severity of deficits Barriers/Prognosis Comment -- CHL IP DIET RECOMMENDATION 07/08/2015 SLP Diet Recommendations Dysphagia 1 (Puree) solids;Nectar thick liquid Liquid Administration via Cup;Straw Medication Administration Whole meds with puree Compensations Slow rate;Small sips/bites Postural Changes Remain semi-upright after after feeds/meals (Comment)   CHL IP OTHER RECOMMENDATIONS 07/08/2015 Recommended Consults -- Oral Care Recommendations Oral care BID Other Recommendations Order thickener from pharmacy   CHL IP FOLLOW UP RECOMMENDATIONS 07/08/2015 Follow up Recommendations Skilled Nursing facility   [facility] IP FREQUENCY AND DURATION 07/08/2015 Speech Therapy Frequency (ACUTE ONLY) min 2x/week Treatment Duration 2 weeks       CHL IP ORAL PHASE 07/08/2015 Oral Phase Impaired Oral - Pudding Teaspoon -- Oral - Pudding Cup -- Oral - Honey Teaspoon -- Oral - Honey  Cup -- Oral - Nectar Teaspoon -- Oral - Nectar Cup Decreased bolus cohesion Oral - Nectar Straw -- Oral - Thin Teaspoon -- Oral - Thin Cup Decreased bolus cohesion Oral - Thin Straw -- Oral - Puree Delayed oral transit Oral - Mech Soft -- Oral - Regular -- Oral - Multi-Consistency -- Oral - Pill Delayed oral transit Oral Phase - Comment --  CHL IP PHARYNGEAL PHASE 07/08/2015 Pharyngeal Phase Impaired Pharyngeal- Pudding Teaspoon -- Pharyngeal -- Pharyngeal- Pudding Cup -- Pharyngeal -- Pharyngeal- Honey Teaspoon -- Pharyngeal -- Pharyngeal- Honey Cup -- Pharyngeal -- Pharyngeal- Nectar Teaspoon -- Pharyngeal -- Pharyngeal- Nectar Cup Penetration/Aspiration during swallow;Pharyngeal residue - valleculae;Pharyngeal residue - pyriform;Reduced tongue base retraction;Reduced laryngeal elevation Pharyngeal Material enters airway, remains ABOVE vocal cords and not ejected out Pharyngeal- Nectar Straw -- Pharyngeal -- Pharyngeal- Thin Teaspoon -- Pharyngeal -- Pharyngeal- Thin Cup Penetration/Aspiration during swallow;Delayed swallow initiation-pyriform sinuses;Pharyngeal residue - valleculae;Pharyngeal residue - pyriform;Reduced tongue base retraction;Reduced laryngeal elevation Pharyngeal Material enters airway, passes BELOW cords without attempt by patient to eject out (silent aspiration);Material enters airway, CONTACTS cords and not ejected out;Material enters airway, remains ABOVE vocal cords and not ejected out Pharyngeal- Thin Straw -- Pharyngeal -- Pharyngeal- Puree Delayed swallow initiation-pyriform sinuses;Pharyngeal residue - valleculae;Reduced tongue base retraction Pharyngeal -- Pharyngeal- Mechanical Soft -- Pharyngeal -- Pharyngeal- Regular -- Pharyngeal -- Pharyngeal- Multi-consistency -- Pharyngeal -- Pharyngeal- Pill Delayed swallow initiation-vallecula Pharyngeal --  Pharyngeal Comment --  CHL IP CERVICAL ESOPHAGEAL PHASE 07/08/2015 Cervical Esophageal Phase Impaired Pudding Teaspoon -- Pudding Cup -- Honey Teaspoon -- Honey Cup -- Nectar Teaspoon -- Nectar Cup -- Nectar Straw -- Thin Teaspoon -- Thin Cup -- Thin Straw -- Puree -- Mechanical Soft -- Regular -- Multi-consistency -- Pill -- Cervical Esophageal Comment (No Data) Houston Siren 07/08/2015, 11:25 AM  Orbie Pyo Colvin Caroli.Ed [employer] [phone]              Microbiology: Recent Results (from the past 240 hour(s))  Blood Culture (routine x 2)     Status: None (Preliminary result)   Collection Time: 07/07/15  6:05 PM  Result Value Ref Range Status   Specimen Description BLOOD LEFT HAND  Final   Special Requests BOTTLES DRAWN AEROBIC AND ANAEROBIC 5CC  Final   Culture NO GROWTH 4 DAYS  Final   Report Status PENDING  Incomplete  Blood Culture (routine x 2)     Status: None   Collection Time: 07/07/15  6:07 PM  Result Value Ref Range Status   Specimen Description BLOOD LEFT ANTECUBITAL  Final   Special Requests BOTTLES DRAWN AEROBIC AND ANAEROBIC 5CC  Final   Culture  Setup Time   Final    GRAM POSITIVE COCCI IN CLUSTERS ANAEROBIC BOTTLE ONLY CRITICAL RESULT CALLED TO, READ BACK BY AND VERIFIED WITH: B DALTON RN 1917 07/08/15 A BROWNING    Culture   Final    STAPHYLOCOCCUS SPECIES (COAGULASE NEGATIVE) THE SIGNIFICANCE OF ISOLATING THIS ORGANISM FROM A SINGLE SET OF BLOOD CULTURES WHEN MULTIPLE SETS ARE DRAWN IS UNCERTAIN. PLEASE NOTIFY THE MICROBIOLOGY DEPARTMENT WITHIN ONE WEEK IF SPECIATION AND SENSITIVITIES ARE REQUIRED.    Report Status 07/10/2015 FINAL  Final  Urine culture     Status: None (Preliminary result)   Collection Time: 07/07/15  7:14 PM  Result Value Ref Range Status   Specimen Description URINE, CATHETERIZED  Final   Special Requests NONE  Final   Culture   Final    >=100,000 COLONIES/mL ESCHERICHIA COLI SENT TO  REFERENCE LAB FOR SUSCEPTIBLITY TESTING RESULT  CALLED TO, READ BACK BY AND VERIFIED WITH: Jackquline Berlin AT 1024 07/11/15 BY L BENFIELD    Report Status PENDING  Incomplete  MRSA PCR Screening     Status: None   Collection Time: 07/07/15 10:39 PM  Result Value Ref Range Status   MRSA by PCR NEGATIVE NEGATIVE Final    Comment:        The GeneXpert MRSA Assay (FDA approved for NASAL specimens only), is one component of a comprehensive MRSA colonization surveillance program. It is not intended to diagnose MRSA infection nor to guide or monitor treatment for MRSA infections.   Culture, sputum-assessment     Status: None   Collection Time: 07/07/15 11:50 PM  Result Value Ref Range Status   Specimen Description EXPECTORATED SPUTUM  Final   Special Requests NONE  Final   Sputum evaluation   Final    MICROSCOPIC FINDINGS SUGGEST THAT THIS SPECIMEN IS NOT REPRESENTATIVE OF LOWER RESPIRATORY SECRETIONS. PLEASE RECOLLECT. Gram Stain Report Called to,Read Back By and Verified With: DENISE _0  07/08/15 MKELLY    Report Status 07/08/2015 FINAL  Final     Labs: Basic Metabolic Panel:  Recent Labs Lab 07/07/15 1805 07/08/15 0720 07/09/15 0555 07/10/15 0430 07/11/15 0556  NA 145 142 141 143 142  K 4.7 4.1 4.3 5.2* 3.4*  CL 105 106 106 110 107  CO2 31 29 21* 19* 28  GLUCOSE 154* 122* 107* 92 121*  BUN _1 <5*  CREATININE 0.46 0.33* 0.36* 0.40* 0.36*  CALCIUM 10.0 8.8* 9.1 9.3 8.9   Liver Function Tests:  Recent Labs Lab 07/07/15 1805  AST 23  ALT 11*  ALKPHOS 63  BILITOT 0.1*  PROT 7.6  ALBUMIN 3.5   No results for input(s): LIPASE, AMYLASE in the last 168 hours. No results for input(s): AMMONIA in the last 168 hours. CBC:  Recent Labs Lab 07/07/15 1805 07/09/15 0550 07/10/15 0700 07/11/15 0556  WBC 8.7 6.9 5.3 4.8  NEUTROABS 5.2  --   --   --   HGB 14.1 10.6* 10.7* 10.7*  HCT 49.5* 37.7 37.0 36.5  MCV 83.1 84.0 80.6 80.4  PLT PLATELET CLUMPS NOTED ON SMEAR, COUNT APPEARS ADEQUATE 243 283 262    Cardiac Enzymes:  Recent Labs Lab 07/07/15 2049 07/08/15 0309 07/08/15 0720 07/11/15 0556  TROPONINI 1.23* 1.12* 0.99* 0.37*   BNP: BNP (last 3 results)  Recent Labs  07/07/15 1805  BNP 36.9    ProBNP (last 3 results) No results for input(s): PROBNP in the last 8760 hours.  CBG:  Recent Labs Lab 07/10/15 1620 07/10/15 2125 07/11/15 0049 07/11/15 0414 07/11/15 0820  GLUCAP 223* 179* 118* 113* 118*    Time coordinating discharge: 35 minutes  Signed:  Lulu Hirschmann  [facility] 07/11/2015, 10:28 AM

## 2015-07-11 NOTE — Progress Notes (Signed)
Patient will discharge to [facility] Anticipated discharge date: 12/31 Family notified: pt dtr Transportation by [facility]- called at 9:50pm  CSW signing off.  Domenica Reamer, [place] Social Worker [phone]

## 2015-07-11 NOTE — Clinical Social Work Note (Signed)
Clinical Social Work Assessment  Patient Details  Name: [name] MRN: [id number] Date of Birth: 1935/04/01  Date of referral:  07/11/15               Reason for consult:                   Permission sought to share information with:  Family Supports, Customer service manager Permission granted to share information::     Name::     Edward Qualia  Agency::  [facility]  Relationship::  dtr  Contact Information:     Housing/Transportation Living arrangements for the past 2 months:  [place] of Information:  Adult Children Patient Interpreter Needed:  None Criminal Activity/Legal Involvement Pertinent to Current Situation/Hospitalization:  No - Comment as needed Significant Relationships:  Adult Children Lives with:  Facility Resident Do you feel safe going back to the place where you live?  Yes Need for family participation in patient care:  Yes (Comment) (decision making)  Care giving concerns:  None- pt is long term resident at [facility] / plan:  CSW spoke with dtr concerning pt return to [facility]  Employment status:  Retired Forensic scientist:  Programmer, applications PT Recommendations:  Not assessed at this time [place] / Referral to community resources:     Patient/Family's Response to care:Dtr agreeable to pt return to [facility]- pt has been living there for many years.  Patient/Family's Understanding of and Emotional Response to Diagnosis, Current Treatment, and Prognosis:  Some concern about pt DCing today- states MD had informed them it would likely be a few days before she could return.  Emotional Assessment Appearance:  Appears stated age Attitude/Demeanor/Rapport:  Unable to Assess Affect (typically observed):  Unable to Assess Orientation:  Oriented to Self, Oriented to Place, Oriented to  Time, Oriented to Situation Alcohol / Substance use:    Psych involvement (Current and /or in the  community):  No (Comment)  Discharge Needs  Concerns to be addressed:  No discharge needs identified Readmission within the last 30 days:  No Current discharge risk:  None Barriers to Discharge:  No Barriers Identified   Cranford Mon, LCSW 07/11/2015, 9:22 AM

## 2015-07-11 NOTE — Progress Notes (Signed)
Patient being d/c to [facility]. Attempted report to [facility]. [facility] here to transport patient back to [facility]. Family notified of discharge.

## 2015-07-12 LAB — CULTURE, BLOOD (ROUTINE X 2): CULTURE: NO GROWTH

## 2015-07-14 ENCOUNTER — Telehealth: Payer: Self-pay | Admitting: Internal Medicine

## 2015-07-14 ENCOUNTER — Non-Acute Institutional Stay (SKILLED_NURSING_FACILITY): Payer: Medicare Other | Admitting: Internal Medicine

## 2015-07-14 DIAGNOSIS — G35 Multiple sclerosis: Secondary | ICD-10-CM | POA: Diagnosis not present

## 2015-07-14 DIAGNOSIS — D509 Iron deficiency anemia, unspecified: Secondary | ICD-10-CM

## 2015-07-14 DIAGNOSIS — B9620 Unspecified Escherichia coli [E. coli] as the cause of diseases classified elsewhere: Secondary | ICD-10-CM

## 2015-07-14 DIAGNOSIS — K51919 Ulcerative colitis, unspecified with unspecified complications: Secondary | ICD-10-CM

## 2015-07-14 DIAGNOSIS — I248 Other forms of acute ischemic heart disease: Secondary | ICD-10-CM | POA: Diagnosis not present

## 2015-07-14 DIAGNOSIS — J189 Pneumonia, unspecified organism: Secondary | ICD-10-CM

## 2015-07-14 DIAGNOSIS — E785 Hyperlipidemia, unspecified: Secondary | ICD-10-CM

## 2015-07-14 DIAGNOSIS — N390 Urinary tract infection, site not specified: Secondary | ICD-10-CM

## 2015-07-14 DIAGNOSIS — K5900 Constipation, unspecified: Secondary | ICD-10-CM

## 2015-07-14 DIAGNOSIS — I10 Essential (primary) hypertension: Secondary | ICD-10-CM

## 2015-07-14 DIAGNOSIS — J9601 Acute respiratory failure with hypoxia: Secondary | ICD-10-CM | POA: Diagnosis not present

## 2015-07-14 DIAGNOSIS — H6123 Impacted cerumen, bilateral: Secondary | ICD-10-CM

## 2015-07-14 DIAGNOSIS — J9602 Acute respiratory failure with hypercapnia: Secondary | ICD-10-CM

## 2015-07-14 DIAGNOSIS — E119 Type 2 diabetes mellitus without complications: Secondary | ICD-10-CM | POA: Diagnosis not present

## 2015-07-14 DIAGNOSIS — K219 Gastro-esophageal reflux disease without esophagitis: Secondary | ICD-10-CM

## 2015-07-14 DIAGNOSIS — M792 Neuralgia and neuritis, unspecified: Secondary | ICD-10-CM

## 2015-07-14 DIAGNOSIS — R1310 Dysphagia, unspecified: Secondary | ICD-10-CM

## 2015-07-14 DIAGNOSIS — E876 Hypokalemia: Secondary | ICD-10-CM | POA: Diagnosis not present

## 2015-07-14 LAB — URINE CULTURE

## 2015-07-14 MED ORDER — SULFAMETHOXAZOLE-TRIMETHOPRIM 800-160 MG PO TABS: 1.0000 | ORAL_TABLET | Freq: Two times a day (BID) | ORAL | Status: DC

## 2015-07-14 NOTE — Progress Notes (Addendum)
Patient ID: [name], female   DOB: 1934-10-21, 81 y.o.   MRN: [id number]     Facility: [facility]    PCP: Blanchie Serve, MD  Code Status: DNR  Allergies  Allergen Reactions  . Contrast Media [Iodinated Diagnostic Agents]     Unknown    Chief Complaint  Patient presents with  . Readmit To SNF     HPI:  81 y.o. patient is here for long term care post hospital admission from 07/07/15-07/11/15 with acute respiratory failure with hypoxia and hypercarbia in setting of HCAP vs aspiration pneumonia, E.Coli UTI and sepsis with hypotension. She received iv fluids and was started on antibiotics. She was seen by SLP team. She also had troponin leak thought to be from demand ischemia in setting of sepsis. Cardiology was consulted and recommended medical management. She has PMH of MS with quadriplegia, HTN, GERD, Ulcerative colitis, DM, chronic CHF among others. She is seen in her room today. She complaints of right earache. Complaints of pain to her feet. Denies any other concerns. Her breathing has been stable.   Review of Systems:  Constitutional: Negative for fever, chills, diaphoresis.  HENT: Negative for headache, congestion, nasal discharge Eyes: Negative for blurred vision, double vision and discharge.  Respiratory: Negative for cough, shortness of breath and wheezing.   Cardiovascular: Negative for chest pain, palpitations, leg swelling.  Gastrointestinal: Negative for heartburn, nausea, vomiting, abdominal pain. Had bowel movement this am Genitourinary: Negative for dysuria.  Skin: Negative for itching, rash.  Psychiatric/Behavioral: Negative for depression   Past Medical History  Diagnosis Date  . Hypertension   . Diabetes mellitus   . GERD (gastroesophageal reflux disease)   . Neuromuscular disorder ([place])   . Multiple sclerosis ([place])   . Anxiety   . Hyperlipemia   . Pneumonia   . Colitis   . Unable to ambulate   . GI bleed 09/07/2012  .  Type II or unspecified type diabetes mellitus without mention of complication, uncontrolled 02/03/2013  . Ulcerative colitis, unspecified 02/03/2013  . Chronic diastolic CHF (congestive heart failure) [facility])    Past Surgical History  Procedure Laterality Date  . Tubal ligation    . Flexible sigmoidoscopy  10/05/2011    Procedure: FLEXIBLE SIGMOIDOSCOPY;  Surgeon: Winfield Cunas., MD;  Location: [facility];  Service: Endoscopy;  Laterality: N/A;   pt. coming from [place] daughter phone no. [phone] might come with her  sedation is needed, pt. is paraplegic  . Esophagogastroduodenoscopy N/A 09/09/2012    Procedure: ESOPHAGOGASTRODUODENOSCOPY (EGD);  Surgeon: Winfield Cunas., MD;  Location: [facility] ENDOSCOPY;  Service: Endoscopy;  Laterality: N/A;  . Colonoscopy N/A 09/09/2012    Procedure: COLONOSCOPY;  Surgeon: Winfield Cunas., MD;  Location: [facility] ENDOSCOPY;  Service: Endoscopy;  Laterality: N/A;  possible flex sig   Social History:   reports that she quit smoking about 19 years ago. She has never used smokeless tobacco. She reports that she does not drink alcohol or use illicit drugs.  Family History  Problem Relation Age of Onset  . CAD Mother   . Diabetes Daughter   . Diabetes Daughter   . Diabetes Son     Medications:   Medication List       This list is accurate as of: 07/14/15  9:39 AM.  Always use your most recent med list.               acetaminophen 325 MG tablet  Commonly known as:  TYLENOL  Take 650 mg by mouth every 4 (four) hours as needed.     amoxicillin-clavulanate 875-125 MG tablet  Commonly known as:  AUGMENTIN  Take 1 tablet by mouth 2 (two) times daily.     aspirin 81 MG EC tablet  Take 1 tablet (81 mg total) by mouth daily.     atorvastatin 40 MG tablet  Commonly known as:  LIPITOR  Take 1 tablet (40 mg total) by mouth daily at 6 PM.     cetirizine 10 MG tablet  Commonly known as:  ZYRTEC  Take 1 tablet (10 mg  total) by mouth at bedtime as needed for allergies or rhinitis. Take one tablet by mouth every night at bedtime for allergic rhinitis     cyanocobalamin 100 MCG tablet  Take 1 tablet (100 mcg total) by mouth daily.     famotidine 20 MG tablet  Commonly known as:  PEPCID  Take 1 tablet (20 mg total) by mouth 2 (two) times daily as needed for heartburn or indigestion.     ferrous sulfate 325 (65 FE) MG EC tablet  Take 325 mg by mouth 3 (three) times daily with meals.     guaiFENesin 600 MG 12 hr tablet  Commonly known as:  MUCINEX  Take 1 tablet (600 mg total) by mouth 2 (two) times daily as needed for cough or to loosen phlegm.     linagliptin 5 MG Tabs tablet  Commonly known as:  TRADJENTA  Take 1 tablet (5 mg total) by mouth daily.     metFORMIN 500 MG tablet  Commonly known as:  GLUCOPHAGE  Take 1 tablet (500 mg total) by mouth 2 (two) times daily with a meal. Take one tablet by mouth twice daily to control blood sugar     metoprolol tartrate 25 MG tablet  Commonly known as:  LOPRESSOR  Take 1/2 tablet by mouth daily for blood pressure. HOLD for SBP <110     nitroGLYCERIN 0.4 MG SL tablet  Commonly known as:  NITROSTAT  Place 1 tablet (0.4 mg total) under the tongue every 5 (five) minutes as needed for chest pain.     polyethylene glycol packet  Commonly known as:  MIRALAX / GLYCOLAX  Mix powder in 4-8 oz of liquid once daily for constipation     RESOURCE THICKENUP CLEAR Powd  Nectar thick     saccharomyces boulardii 250 MG capsule  Commonly known as:  FLORASTOR  Take 1 capsule (250 mg total) by mouth 2 (two) times daily.     sulfaDIAZINE 500 MG tablet  Take two tablets by mouth twice daily with H2O for IBS     zinc oxide 11.3 % Crea cream  Commonly known as:  BALMEX  Apply cream topically to groin and buttock every shift as preventative care         Physical Exam: Filed Vitals:   07/14/15 0939  BP: 113/68  Pulse: 74  Temp: 97.6 F (36.4 C)  Resp: 18    SpO2: 98%    General- elderly frail female in no acute distress Head- atraumatic, normocephalic Eyes- PERRLA, EOMI, no pallor, no icterus, no discharge Ears- impacted cerumen to left ear and tympanic membrane not visualized, right ear has cerumen and some blood noted Neck- no lymphadenopathy, no JVD Mouth- normal mucus membrane, has dentures Cardiovascular- normal s1,s2, no murmurs, normal distal pulses, trace edema Respiratory- bilateral clear to auscultation, no wheeze, no rhonchi, no crackles, on o2 Abdomen-  bowel sounds present, soft, non tender Musculoskeletal- quadriplegia, restricted ROM at neck area, no cervical spine tenderness, contracture in her feet,  no leg edema Neurological- alert and oriented Skin- warm and dry Psychiatry- normal mood and affect   Labs reviewed: Basic Metabolic Panel:  Recent Labs  07/09/15 0555 07/10/15 0430 07/11/15 0556  NA 141 143 142  K 4.3 5.2* 3.4*  CL 106 110 107  CO2 21* 19* 28  GLUCOSE 107* 92 121*  BUN 8 6 <5*  CREATININE 0.36* 0.40* 0.36*  CALCIUM 9.1 9.3 8.9   Liver Function Tests:  Recent Labs  07/07/15 1805  AST 23  ALT 11*  ALKPHOS 63  BILITOT 0.1*  PROT 7.6  ALBUMIN 3.5   No results for input(s): LIPASE, AMYLASE in the last 8760 hours. No results for input(s): AMMONIA in the last 8760 hours. CBC:  Recent Labs  07/07/15 1805 07/09/15 0550 07/10/15 0700 07/11/15 0556  WBC 8.7 6.9 5.3 4.8  NEUTROABS 5.2  --   --   --   HGB 14.1 10.6* 10.7* 10.7*  HCT 49.5* 37.7 37.0 36.5  MCV 83.1 84.0 80.6 80.4  PLT PLATELET CLUMPS NOTED ON SMEAR, COUNT APPEARS ADEQUATE 243 283 262   Cardiac Enzymes:  Recent Labs  07/08/15 0309 07/08/15 0720 07/11/15 0556  TROPONINI 1.12* 0.99* 0.37*   BNP: Invalid input(s): POCBNP CBG:  Recent Labs  07/11/15 0049 07/11/15 0414 07/11/15 0820  GLUCAP 118* 113* 118*    Radiological Exams: Ct Head Wo Contrast  07/08/2015  CLINICAL DATA:  Acute onset of altered mental  status tonight. EXAM: CT HEAD WITHOUT CONTRAST TECHNIQUE: Contiguous axial images were obtained from the base of the skull through the vertex without intravenous contrast. COMPARISON:  CT scan dated 10/29/2007 FINDINGS: No mass lesion. No midline shift. No acute hemorrhage or hematoma. No extra-axial fluid collections. No evidence of acute infarction. There is diffuse slight cerebral cortical atrophy. Slight periventricular lucency in the frontal lobes, left more than right, chronic and unchanged. There are new air-fluid levels in the sphenoid sinus. The osseous structures otherwise are unchanged with prominent chronic hyperostosis frontalis interna. IMPRESSION: 1. No acute abnormality of the brain. Slight atrophy with chronic slight white matter disease in the frontal lobes, probably small vessel ischemic change. 2. New small air-fluid levels in the sphenoid sinus, nonspecific. Electronically Signed   By: Lorriane Shire M.D.   On: 07/08/2015 20:31   Dg Chest Port 1 View  07/07/2015  CLINICAL DATA:  Right-sided facial droop and lethargy.  Hypoxia. EXAM: PORTABLE CHEST 1 VIEW COMPARISON:  09/07/2012 FINDINGS: Midline trachea. Cardiomegaly accentuated by AP portable technique. Atherosclerosis in the transverse aorta. No pleural effusion or pneumothorax. Low lung volumes with resultant pulmonary interstitial prominence. Patchy left base airspace disease IMPRESSION: Low lung volumes. Patchy left base airspace disease is favored to represent atelectasis superimposed upon scarring. Early infection or aspiration cannot be excluded. Cardiomegaly with aortic atherosclerosis. Electronically Signed   By: Abigail Miyamoto M.D.   On: 07/07/2015 18:42   Dg Swallowing Func-speech Pathology  07/08/2015  Objective Swallowing Evaluation:   MBS Patient Details Name: MOZELLE TORSON MRN: [id number] Date of Birth: 1935-02-03 Today's Date: 07/08/2015 Time: SLP Start Time (ACUTE ONLY): 1000-SLP Stop Time (ACUTE ONLY): 1017 SLP Time  Calculation (min) (ACUTE ONLY): 17 min Past Medical History: Past Medical History Diagnosis Date . Hypertension  . Diabetes mellitus  . GERD (gastroesophageal reflux disease)  . Neuromuscular disorder ([place])  . Multiple sclerosis ([place])  . Anxiety  .  Hyperlipemia  . Pneumonia  . Colitis  . Unable to ambulate  . GI bleed 09/07/2012 . Type II or unspecified type diabetes mellitus without mention of complication, uncontrolled 02/03/2013 . Ulcerative colitis, unspecified 02/03/2013 . Chronic diastolic CHF (congestive heart failure) [facility])  Past Surgical History: Past Surgical History Procedure Laterality Date . Tubal ligation   . Flexible sigmoidoscopy  10/05/2011   Procedure: FLEXIBLE SIGMOIDOSCOPY;  Surgeon: Winfield Cunas., MD;  Location: [facility];  Service: Endoscopy;  Laterality: N/A;  pt. coming from [place] daughter phone no. [phone] might come with her sedation is needed, pt. is paraplegic . Esophagogastroduodenoscopy N/A 09/09/2012   Procedure: ESOPHAGOGASTRODUODENOSCOPY (EGD);  Surgeon: Winfield Cunas., MD;  Location: [facility] ENDOSCOPY;  Service: Endoscopy;  Laterality: N/A; . Colonoscopy N/A 09/09/2012   Procedure: COLONOSCOPY;  Surgeon: Winfield Cunas., MD;  Location[facility] END[facility]COPY;  Service: Endoscopy;  Laterality: N/A;  possible flex sig HPI: 81 y.o. female with PMH of hypertension, diabetes mellitus, GERD, anxiety, IBD, UC, GI bleeding, severe MS with quadriplegia and difficulty speaking, who presents with productive cough and shortness of breath and questionable facial drooop from SNF. In ED, patient was found to have positive urinalysis for UTI, positive troponin. Chest x-ray showed patchy left base airspace disease, early infection or aspiration cannot be excluded.  Previous MBS 2013 revealed pharyngeal stasis, no penetration or aspiration, Dys 3 thin liquids recommended. RN received report pt is on thick liquids at SNF although pt denies and states she drinks thin  liquids. Difficult to assess safety with thin liquids with sole bedside swallow.   No Data Recorded Assessment / Plan / Recommendation CHL IP CLINICAL IMPRESSIONS 07/08/2015 Therapy Diagnosis -- Clinical Impression Decreased oral cohesion and mildly delayed transit due to weakness. Mod-severe sensory>motor pharyngeal dysphagia. Pt lacks head control therefore swallow function assessed in both flexed and extended (more supine) positions. Most swallows were initiated at the level of the pyriform sinuses due to decreased sensation. Laryngeal penetration, flash with initial trial thin and silently aspirated in head extension and semi upright position using straw with thin. Decreased cervical esophageal pressures led to barium backflow/residuals to pyriform sinuses. Aspiration is significantly increased as pt is a dependent feeder and dependent on staff for proper head and trunk positioning. SLP recommends Dys 1 diet (due to dentures not present) and nectar thick liquids, upright as possible and head in neutral, small sips, straw allowed and pills whole in applesauce.    Impact on safety and function Severe aspiration risk   CHL IP TREATMENT RECOMMENDATION 07/08/2015 Treatment Recommendations Therapy as outlined in treatment plan below   Prognosis 07/08/2015 Prognosis for Safe Diet Advancement Fair Barriers to Reach Goals Cognitive deficits;Severity of deficits Barriers/Prognosis Comment -- CHL IP DIET RECOMMENDATION 07/08/2015 SLP Diet Recommendations Dysphagia 1 (Puree) solids;Nectar thick liquid Liquid Administration via Cup;Straw Medication Administration Whole meds with puree Compensations Slow rate;Small sips/bites Postural Changes Remain semi-upright after after feeds/meals (Comment)   CHL IP OTHER RECOMMENDATIONS 07/08/2015 Recommended Consults -- Oral Care Recommendations Oral care BID Other Recommendations Order thickener from pharmacy   CHL IP FOLLOW UP RECOMMENDATIONS 07/08/2015 Follow up Recommendations  Skilled Nursing facility   [facility] IP FREQUENCY AND DURATION 07/08/2015 Speech Therapy Frequency (ACUTE ONLY) min 2x/week Treatment Duration 2 weeks      CHL IP ORAL PHASE 07/08/2015 Oral Phase Impaired Oral - Pudding Teaspoon -- Oral - Pudding Cup -- Oral - Honey Teaspoon -- Oral - Honey Cup -- Oral - Nectar Teaspoon -- Oral -  Nectar Cup Decreased bolus cohesion Oral - Nectar Straw -- Oral - Thin Teaspoon -- Oral - Thin Cup Decreased bolus cohesion Oral - Thin Straw -- Oral - Puree Delayed oral transit Oral - Mech Soft -- Oral - Regular -- Oral - Multi-Consistency -- Oral - Pill Delayed oral transit Oral Phase - Comment --  CHL IP PHARYNGEAL PHASE 07/08/2015 Pharyngeal Phase Impaired Pharyngeal- Pudding Teaspoon -- Pharyngeal -- Pharyngeal- Pudding Cup -- Pharyngeal -- Pharyngeal- Honey Teaspoon -- Pharyngeal -- Pharyngeal- Honey Cup -- Pharyngeal -- Pharyngeal- Nectar Teaspoon -- Pharyngeal -- Pharyngeal- Nectar Cup Penetration/Aspiration during swallow;Pharyngeal residue - valleculae;Pharyngeal residue - pyriform;Reduced tongue base retraction;Reduced laryngeal elevation Pharyngeal Material enters airway, remains ABOVE vocal cords and not ejected out Pharyngeal- Nectar Straw -- Pharyngeal -- Pharyngeal- Thin Teaspoon -- Pharyngeal -- Pharyngeal- Thin Cup Penetration/Aspiration during swallow;Delayed swallow initiation-pyriform sinuses;Pharyngeal residue - valleculae;Pharyngeal residue - pyriform;Reduced tongue base retraction;Reduced laryngeal elevation Pharyngeal Material enters airway, passes BELOW cords without attempt by patient to eject out (silent aspiration);Material enters airway, CONTACTS cords and not ejected out;Material enters airway, remains ABOVE vocal cords and not ejected out Pharyngeal- Thin Straw -- Pharyngeal -- Pharyngeal- Puree Delayed swallow initiation-pyriform sinuses;Pharyngeal residue - valleculae;Reduced tongue base retraction Pharyngeal -- Pharyngeal- Mechanical Soft -- Pharyngeal --  Pharyngeal- Regular -- Pharyngeal -- Pharyngeal- Multi-consistency -- Pharyngeal -- Pharyngeal- Pill Delayed swallow initiation-vallecula Pharyngeal -- Pharyngeal Comment --  CHL IP CERVICAL ESOPHAGEAL PHASE 07/08/2015 Cervical Esophageal Phase Impaired Pudding Teaspoon -- Pudding Cup -- Honey Teaspoon -- Honey Cup -- Nectar Teaspoon -- Nectar Cup -- Nectar Straw -- Thin Teaspoon -- Thin Cup -- Thin Straw -- Puree -- Mechanical Soft -- Regular -- Multi-consistency -- Pill -- Cervical Esophageal Comment (No Data) [place] Siren 07/08/2015, 11:25 AM  Orbie Pyo Colvin Caroli.Ed CCC-SLP Pager [phone]              Assessment/Plan  Acute respiratory failure In setting of HCAP. Improved, will complete her antibiotics today. Currently on o2, wean off o2 as tolerated  HCAP On augmentin, will complete course today. Afebrile and back to her baseline. Monitor clinically, continue pulmonary toileting. Continue prn mucinex  E.col uti Reviewed urine culture and sensitivity report. Start bactrim ds 1 tab q12h x 5 days. Hydration to be maintained  Neuropathic pain Start neurontin 100 mg qhs  Iron deficiency anemia Continue feso4 325 mg tid, monitor cbc  Dysphagia Continue aspiration precaution and dysphagia diet  Impacted cerumen Causing earache. Add debrox ear drop bid x 1 week and then ear lavage  DM type 2 Monitor cbg, continue tradjenta 5 mg daily and metformin 500 mg bid  demand ischemia Continue lopressor and aspirin with statin and prn NTG, remains chest pain free.  HTN Stable, monitor bp, continue lopressor 12.5 mg daily  Hypokalemia Monitor bmp  Constipation Continue miralax daily  Hyperlipidemia Continue lipitor 40 mg daily  gerd Stable, continue famotidine 20 mg bid prn  Ulcerative colitis Continue sulfadiazine  MS With quadriplegia, under total care, fall precautions, air mattress and pressure ulcer prophylaxis   Goals of care: long term care  Labs/tests  ordered: cbc, cmp 07/20/15  Family/ staff Communication: reviewed care plan with patient and nursing supervisor    Blanchie Serve, MD  [place] [phone] (Monday-Friday 8 am - 5 pm) [phone] (afterhours)

## 2015-07-14 NOTE — Telephone Encounter (Signed)
Spoke to RN caring for patient about urine culture results.  Rx for 5 days of bactrim for her UTI.

## 2015-07-20 LAB — BASIC METABOLIC PANEL
BUN: 18 mg/dL (ref 4–21)
Creatinine: 0.4 mg/dL — AB (ref 0.5–1.1)
Glucose: 103 mg/dL
SODIUM: 140 mmol/L (ref 137–147)

## 2015-07-20 LAB — HEPATIC FUNCTION PANEL
ALK PHOS: 56 U/L (ref 25–125)
ALT: 13 U/L (ref 7–35)
BILIRUBIN, TOTAL: 0.2 mg/dL

## 2015-07-20 LAB — CBC AND DIFFERENTIAL
HCT: 52 % — AB (ref 36–46)
HEMOGLOBIN: 14.8 g/dL (ref 12.0–16.0)
Platelets: 384 10*3/uL (ref 150–399)
WBC: 11.9 10*3/mL

## 2015-07-21 ENCOUNTER — Encounter: Payer: Self-pay | Admitting: Internal Medicine

## 2015-07-21 ENCOUNTER — Non-Acute Institutional Stay (SKILLED_NURSING_FACILITY): Payer: Medicare Other | Admitting: Internal Medicine

## 2015-07-21 DIAGNOSIS — R630 Anorexia: Secondary | ICD-10-CM | POA: Diagnosis not present

## 2015-07-21 DIAGNOSIS — D72829 Elevated white blood cell count, unspecified: Secondary | ICD-10-CM

## 2015-07-21 DIAGNOSIS — R000 Tachycardia, unspecified: Secondary | ICD-10-CM

## 2015-07-21 NOTE — Progress Notes (Signed)
Patient ID: [name], female   DOB: 1934-11-02, 81 y.o.   MRN: [id number]     Facility: [facility]    PCP: Blanchie Serve, MD  Code Status: DNR  Allergies  Allergen Reactions  . Contrast Media [Iodinated Diagnostic Agents]     Unknown    Chief Complaint  Patient presents with  . Acute Visit    Decreased appetite and tachycardia      HPI:  81 y.o. patient is seen for acute visit. Staff have noticed her to be tachycardic. Of note, she was recently in hospital with sepsis and acute respiratory failure in setting of UTI and HCAP. She continues to required oxygen. She is seen in her room today. She complaints of loss of appetite. Denies any palpitation. Denies any fever or chills. Other vital signs on review are stable. She has PMH of MS with quadriplegia, HTN, GERD, Ulcerative colitis, DM, chronic CHF among others.    Review of Systems:  Constitutional: Negative for fever, chills, diaphoresis.  HENT: Negative for headache, congestion, nasal discharge Eyes: Negative for blurred vision, double vision and discharge.  Respiratory: positive for cough. denies shortness of breath and wheezing.   Cardiovascular: Negative for chest pain, palpitations, leg swelling.  Gastrointestinal: Negative for heartburn, nausea, vomiting, abdominal pain.  Genitourinary: Negative for dysuria or flank pain.  Skin: Negative for rash.  Psychiatric/Behavioral: Negative for depression   Past Medical History  Diagnosis Date  . Hypertension   . Diabetes mellitus   . GERD (gastroesophageal reflux disease)   . Neuromuscular disorder ([place])   . Multiple sclerosis ([name])   . Anxiety   . Hyperlipemia   . Pneumonia   . Colitis   . Unable to ambulate   . GI bleed 09/07/2012  . Type II or unspecified type diabetes mellitus without mention of complication, uncontrolled 02/03/2013  . Ulcerative colitis, unspecified 02/03/2013  . Chronic diastolic CHF (congestive heart failure) (HCC)       Medications:   Medication List       This list is accurate as of: 07/21/15  4:34 PM.  Always use your most recent med list.               acetaminophen 325 MG tablet  Commonly known as:  TYLENOL  Take 650 mg by mouth every 4 (four) hours as needed.     aspirin 81 MG EC tablet  Take 1 tablet (81 mg total) by mouth daily.     atorvastatin 40 MG tablet  Commonly known as:  LIPITOR  Take 1 tablet (40 mg total) by mouth daily at 6 PM.     cetirizine 10 MG tablet  Commonly known as:  ZYRTEC  Take 10 mg by mouth at bedtime.     cyanocobalamin 100 MCG tablet  Take 1 tablet (100 mcg total) by mouth daily.     famotidine 20 MG tablet  Commonly known as:  PEPCID  Take 1 tablet (20 mg total) by mouth 2 (two) times daily as needed for heartburn or indigestion.     ferrous sulfate 325 (65 FE) MG EC tablet  Take 325 mg by mouth 3 (three) times daily with meals.     guaiFENesin 600 MG 12 hr tablet  Commonly known as:  MUCINEX  Take 1 tablet (600 mg total) by mouth 2 (two) times daily as needed for cough or to loosen phlegm.     linagliptin 5 MG Tabs tablet  Commonly known as:  TRADJENTA  Take 1 tablet (5 mg total) by mouth daily.     metFORMIN 500 MG tablet  Commonly known as:  GLUCOPHAGE  Take 1 tablet (500 mg total) by mouth 2 (two) times daily with a meal. Take one tablet by mouth twice daily to control blood sugar     metoprolol tartrate 25 MG tablet  Commonly known as:  LOPRESSOR  Take 1/2 tablet by mouth daily for blood pressure. HOLD for SBP <110     nitroGLYCERIN 0.4 MG SL tablet  Commonly known as:  NITROSTAT  Place 1 tablet (0.4 mg total) under the tongue every 5 (five) minutes as needed for chest pain.     polyethylene glycol packet  Commonly known as:  MIRALAX / GLYCOLAX  Mix powder in 4-8 oz of liquid once daily for constipation     sulfaDIAZINE 500 MG tablet  Take two tablets by mouth twice daily with H2O for IBS     zinc oxide 11.3 % Crea cream    Commonly known as:  BALMEX  Apply cream topically to groin and buttock every shift as preventative care         Physical Exam: Filed Vitals:   07/21/15 1619  BP: 116/63  Pulse: 110  Temp: 97.8 F (36.6 C)  TempSrc: Oral  Resp: 16  SpO2: 92%    General- elderly frail female in no acute distress Head- atraumatic, normocephalic Neck- no lymphadenopathy, no JVD Mouth- normal mucus membrane, has dentures Cardiovascular- tachycardic, no murmurs, normal distal pulses, trace edema Respiratory- bilateral clear to auscultation, no wheeze, no rhonchi, no crackles, on o2 Abdomen- bowel sounds present, soft, non tender Musculoskeletal- quadriplegia, restricted ROM at neck area, no cervical spine tenderness, contracture in her feet,  no leg edema Neurological- alert and oriented Skin- warm and dry Psychiatry- normal mood and affect   Labs reviewed: Basic Metabolic Panel:  Recent Labs  07/09/15 0555 07/10/15 0430 07/11/15 0556 07/20/15  NA 141 143 142 140  K 4.3 5.2* 3.4*  --   CL 106 110 107  --   CO2 21* 19* 28  --   GLUCOSE 107* 92 121*  --   BUN 8 6 <5* 18  CREATININE 0.36* 0.40* 0.36* 0.4*  CALCIUM 9.1 9.3 8.9  --    Liver Function Tests:  Recent Labs  07/07/15 1805 07/20/15  AST 23  --   ALT 11* 13  ALKPHOS 63 56  BILITOT 0.1*  --   PROT 7.6  --   ALBUMIN 3.5  --    No results for input(s): LIPASE, AMYLASE in the last 8760 hours. No results for input(s): AMMONIA in the last 8760 hours. CBC:  Recent Labs  07/07/15 1805 07/09/15 0550 07/10/15 0700 07/11/15 0556 07/20/15  WBC 8.7 6.9 5.3 4.8 11.9  NEUTROABS 5.2  --   --   --   --   HGB 14.1 10.6* 10.7* 10.7* 14.8  HCT 49.5* 37.7 37.0 36.5 52*  MCV 83.1 84.0 80.6 80.4  --   PLT PLATELET CLUMPS NOTED ON SMEAR, COUNT APPEARS ADEQUATE 243 283 262 384   Cardiac Enzymes:  Recent Labs  07/08/15 0309 07/08/15 0720 07/11/15 0556  TROPONINI 1.12* 0.99* 0.37*   BNP: Invalid input(s):  POCBNP CBG:  Recent Labs  07/11/15 0049 07/11/15 0414 07/11/15 0820  GLUCAP 118* 113* 118*    Radiological Exams: Ct Head Wo Contrast  07/08/2015  CLINICAL DATA:  Acute onset of altered mental status tonight. EXAM: CT HEAD WITHOUT  CONTRAST TECHNIQUE: Contiguous axial images were obtained from the base of the skull through the vertex without intravenous contrast. COMPARISON:  CT scan dated 10/29/2007 FINDINGS: No mass lesion. No midline shift. No acute hemorrhage or hematoma. No extra-axial fluid collections. No evidence of acute infarction. There is diffuse slight cerebral cortical atrophy. Slight periventricular lucency in the frontal lobes, left more than right, chronic and unchanged. There are new air-fluid levels in the sphenoid sinus. The osseous structures otherwise are unchanged with prominent chronic hyperostosis frontalis interna. IMPRESSION: 1. No acute abnormality of the brain. Slight atrophy with chronic slight white matter disease in the frontal lobes, probably small vessel ischemic change. 2. New small air-fluid levels in the sphenoid sinus, nonspecific. Electronically Signed   By: Lorriane Shire M.D.   On: 07/08/2015 20:31   Dg Chest Port 1 View  07/07/2015  CLINICAL DATA:  Right-sided facial droop and lethargy.  Hypoxia. EXAM: PORTABLE CHEST 1 VIEW COMPARISON:  09/07/2012 FINDINGS: Midline trachea. Cardiomegaly accentuated by AP portable technique. Atherosclerosis in the transverse aorta. No pleural effusion or pneumothorax. Low lung volumes with resultant pulmonary interstitial prominence. Patchy left base airspace disease IMPRESSION: Low lung volumes. Patchy left base airspace disease is favored to represent atelectasis superimposed upon scarring. Early infection or aspiration cannot be excluded. Cardiomegaly with aortic atherosclerosis. Electronically Signed   By: Abigail Miyamoto M.D.   On: 07/07/2015 18:42    Assessment/Plan  Tachycardia Sinus tachycardia on ekg. Patient  asymptomatic. Change lopressor to 25 mg daily for now. Monitor HR and BP BID. Get CXR abd repeat u/a with c/s to rule out infection contributing to this tachycardia. Patient feels clinically improved and has remained afebrile, thus infectious etiology less likely.   Poor appetite Add remeron 7.5 mg qhs and monitor, get dietary consult for poor appetite  Leukocytosis Recheck cbc with diff, send u/a with c/s and get cxr PA/ lateral for now, just completed antibiotics for uti and pneumonia. Monitor temperature curve and wbc count   Family/ staff Communication: reviewed care plan with patient and nursing supervisor    Blanchie Serve, MD  [facility] [phone] (Monday-Friday 8 am - 5 pm) [phone] (afterhours)

## 2015-07-22 LAB — CBC AND DIFFERENTIAL
HCT: 47 % — AB (ref 36–46)
Hemoglobin: 13.8 g/dL (ref 12.0–16.0)
PLATELETS: 386 10*3/uL (ref 150–399)
WBC: 11.4 10*3/mL

## 2015-08-09 ENCOUNTER — Emergency Department (HOSPITAL_COMMUNITY): Payer: Medicare Other

## 2015-08-09 ENCOUNTER — Encounter (HOSPITAL_COMMUNITY): Payer: Self-pay

## 2015-08-09 ENCOUNTER — Inpatient Hospital Stay (HOSPITAL_COMMUNITY)
Admission: EM | Admit: 2015-08-09 | Discharge: 2015-08-10 | DRG: 871 | Disposition: A | Discharge status: Skilled Nursing Facility | Payer: Medicare Other | Attending: Internal Medicine | Admitting: Internal Medicine

## 2015-08-09 DIAGNOSIS — Z833 Family history of diabetes mellitus: Secondary | ICD-10-CM | POA: Diagnosis not present

## 2015-08-09 DIAGNOSIS — J9601 Acute respiratory failure with hypoxia: Secondary | ICD-10-CM | POA: Diagnosis present

## 2015-08-09 DIAGNOSIS — J189 Pneumonia, unspecified organism: Secondary | ICD-10-CM | POA: Diagnosis present

## 2015-08-09 DIAGNOSIS — F411 Generalized anxiety disorder: Secondary | ICD-10-CM | POA: Diagnosis present

## 2015-08-09 DIAGNOSIS — Z7984 Long term (current) use of oral hypoglycemic drugs: Secondary | ICD-10-CM | POA: Diagnosis not present

## 2015-08-09 DIAGNOSIS — I110 Hypertensive heart disease with heart failure: Secondary | ICD-10-CM | POA: Diagnosis present

## 2015-08-09 DIAGNOSIS — Z8701 Personal history of pneumonia (recurrent): Secondary | ICD-10-CM | POA: Diagnosis not present

## 2015-08-09 DIAGNOSIS — I252 Old myocardial infarction: Secondary | ICD-10-CM

## 2015-08-09 DIAGNOSIS — R627 Adult failure to thrive: Secondary | ICD-10-CM | POA: Diagnosis present

## 2015-08-09 DIAGNOSIS — Z7189 Other specified counseling: Secondary | ICD-10-CM | POA: Insufficient documentation

## 2015-08-09 DIAGNOSIS — E860 Dehydration: Secondary | ICD-10-CM | POA: Diagnosis present

## 2015-08-09 DIAGNOSIS — A419 Sepsis, unspecified organism: Secondary | ICD-10-CM | POA: Diagnosis present

## 2015-08-09 DIAGNOSIS — Z87891 Personal history of nicotine dependence: Secondary | ICD-10-CM | POA: Diagnosis not present

## 2015-08-09 DIAGNOSIS — Z79899 Other long term (current) drug therapy: Secondary | ICD-10-CM

## 2015-08-09 DIAGNOSIS — I10 Essential (primary) hypertension: Secondary | ICD-10-CM | POA: Diagnosis not present

## 2015-08-09 DIAGNOSIS — R05 Cough: Secondary | ICD-10-CM | POA: Diagnosis not present

## 2015-08-09 DIAGNOSIS — K5190 Ulcerative colitis, unspecified, without complications: Secondary | ICD-10-CM | POA: Diagnosis present

## 2015-08-09 DIAGNOSIS — Z9981 Dependence on supplemental oxygen: Secondary | ICD-10-CM

## 2015-08-09 DIAGNOSIS — G8250 Quadriplegia, unspecified: Secondary | ICD-10-CM | POA: Diagnosis present

## 2015-08-09 DIAGNOSIS — Z7982 Long term (current) use of aspirin: Secondary | ICD-10-CM | POA: Diagnosis not present

## 2015-08-09 DIAGNOSIS — E119 Type 2 diabetes mellitus without complications: Secondary | ICD-10-CM | POA: Diagnosis present

## 2015-08-09 DIAGNOSIS — Z8249 Family history of ischemic heart disease and other diseases of the circulatory system: Secondary | ICD-10-CM | POA: Diagnosis not present

## 2015-08-09 DIAGNOSIS — R1310 Dysphagia, unspecified: Secondary | ICD-10-CM | POA: Diagnosis present

## 2015-08-09 DIAGNOSIS — Z7401 Bed confinement status: Secondary | ICD-10-CM

## 2015-08-09 DIAGNOSIS — I5032 Chronic diastolic (congestive) heart failure: Secondary | ICD-10-CM | POA: Diagnosis present

## 2015-08-09 DIAGNOSIS — E785 Hyperlipidemia, unspecified: Secondary | ICD-10-CM | POA: Diagnosis present

## 2015-08-09 DIAGNOSIS — K219 Gastro-esophageal reflux disease without esophagitis: Secondary | ICD-10-CM | POA: Diagnosis present

## 2015-08-09 DIAGNOSIS — G35 Multiple sclerosis: Secondary | ICD-10-CM | POA: Diagnosis present

## 2015-08-09 DIAGNOSIS — Z66 Do not resuscitate: Secondary | ICD-10-CM | POA: Diagnosis present

## 2015-08-09 DIAGNOSIS — J690 Pneumonitis due to inhalation of food and vomit: Secondary | ICD-10-CM | POA: Diagnosis present

## 2015-08-09 DIAGNOSIS — Z515 Encounter for palliative care: Secondary | ICD-10-CM | POA: Insufficient documentation

## 2015-08-09 DIAGNOSIS — K51919 Ulcerative colitis, unspecified with unspecified complications: Secondary | ICD-10-CM

## 2015-08-09 HISTORY — DX: Non-ST elevation (NSTEMI) myocardial infarction: I21.4

## 2015-08-09 LAB — CBC WITH DIFFERENTIAL/PLATELET
BASOS ABS: 0 10*3/uL (ref 0.0–0.1)
Basophils Relative: 0 %
EOS ABS: 0.1 10*3/uL (ref 0.0–0.7)
Eosinophils Relative: 1 %
HCT: 43 % (ref 36.0–46.0)
HEMOGLOBIN: 13.3 g/dL (ref 12.0–15.0)
LYMPHS ABS: 2.6 10*3/uL (ref 0.7–4.0)
LYMPHS PCT: 25 %
MCH: 25.6 pg — AB (ref 26.0–34.0)
MCHC: 30.9 g/dL (ref 30.0–36.0)
MCV: 82.7 fL (ref 78.0–100.0)
MONOS PCT: 9 %
Monocytes Absolute: 0.9 10*3/uL (ref 0.1–1.0)
Neutro Abs: 6.6 10*3/uL (ref 1.7–7.7)
Neutrophils Relative %: 65 %
PLATELETS: 266 10*3/uL (ref 150–400)
RBC: 5.2 MIL/uL — AB (ref 3.87–5.11)
RDW: 21.5 % — ABNORMAL HIGH (ref 11.5–15.5)
WBC: 10.2 10*3/uL (ref 4.0–10.5)

## 2015-08-09 LAB — URINALYSIS, ROUTINE W REFLEX MICROSCOPIC
BILIRUBIN URINE: NEGATIVE
GLUCOSE, UA: NEGATIVE mg/dL
Hgb urine dipstick: NEGATIVE
KETONES UR: NEGATIVE mg/dL
LEUKOCYTES UA: NEGATIVE
Nitrite: NEGATIVE
PH: 5.5 (ref 5.0–8.0)
PROTEIN: NEGATIVE mg/dL
Specific Gravity, Urine: 1.024 (ref 1.005–1.030)

## 2015-08-09 LAB — BASIC METABOLIC PANEL
ANION GAP: 11 (ref 5–15)
BUN: 14 mg/dL (ref 6–20)
CALCIUM: 10.6 mg/dL — AB (ref 8.9–10.3)
CO2: 33 mmol/L — ABNORMAL HIGH (ref 22–32)
Chloride: 101 mmol/L (ref 101–111)
Creatinine, Ser: 0.59 mg/dL (ref 0.44–1.00)
Glucose, Bld: 140 mg/dL — ABNORMAL HIGH (ref 65–99)
POTASSIUM: 4.4 mmol/L (ref 3.5–5.1)
SODIUM: 145 mmol/L (ref 135–145)

## 2015-08-09 LAB — I-STAT CG4 LACTIC ACID, ED: LACTIC ACID, VENOUS: 2.39 mmol/L — AB (ref 0.5–2.0)

## 2015-08-09 MED ORDER — SULFADIAZINE 500 MG PO TABS
1000.0000 mg | ORAL_TABLET | Freq: Two times a day (BID) | ORAL | Status: DC
  Administered 2015-08-10: 1000 mg via ORAL
  Filled 2015-08-09 (×2): qty 2

## 2015-08-09 MED ORDER — GUAIFENESIN ER 600 MG PO TB12: 600.0000 mg | ORAL_TABLET | Freq: Two times a day (BID) | ORAL | Status: DC | PRN

## 2015-08-09 MED ORDER — ENOXAPARIN SODIUM 40 MG/0.4ML ~~LOC~~ SOLN
40.0000 mg | SUBCUTANEOUS | Status: DC
  Administered 2015-08-10: 40 mg via SUBCUTANEOUS
  Filled 2015-08-09: qty 0.4

## 2015-08-09 MED ORDER — VANCOMYCIN HCL IN DEXTROSE 1-5 GM/200ML-% IV SOLN
1000.0000 mg | Freq: Once | INTRAVENOUS | Status: AC
  Administered 2015-08-09: 1000 mg via INTRAVENOUS
  Filled 2015-08-09: qty 200

## 2015-08-09 MED ORDER — FERROUS SULFATE 325 (65 FE) MG PO TABS
325.0000 mg | ORAL_TABLET | Freq: Three times a day (TID) | ORAL | Status: DC
  Administered 2015-08-10 (×3): 325 mg via ORAL
  Filled 2015-08-09 (×3): qty 1

## 2015-08-09 MED ORDER — INSULIN ASPART 100 UNIT/ML ~~LOC~~ SOLN
0.0000 [IU] | Freq: Three times a day (TID) | SUBCUTANEOUS | Status: DC
  Administered 2015-08-10: 1 [IU] via SUBCUTANEOUS

## 2015-08-09 MED ORDER — VITAMIN B-12 100 MCG PO TABS
100.0000 ug | ORAL_TABLET | Freq: Every day | ORAL | Status: DC
  Administered 2015-08-10: 100 ug via ORAL
  Filled 2015-08-09: qty 1

## 2015-08-09 MED ORDER — ASPIRIN EC 81 MG PO TBEC
81.0000 mg | DELAYED_RELEASE_TABLET | Freq: Every day | ORAL | Status: DC
  Administered 2015-08-10: 81 mg via ORAL
  Filled 2015-08-09: qty 1

## 2015-08-09 MED ORDER — LORATADINE 10 MG PO TABS
10.0000 mg | ORAL_TABLET | Freq: Every day | ORAL | Status: DC
  Administered 2015-08-10: 10 mg via ORAL
  Filled 2015-08-09: qty 1

## 2015-08-09 MED ORDER — POLYETHYLENE GLYCOL 3350 17 G PO PACK
17.0000 g | PACK | Freq: Two times a day (BID) | ORAL | Status: DC
  Administered 2015-08-10: 17 g via ORAL
  Filled 2015-08-09: qty 1

## 2015-08-09 MED ORDER — SODIUM CHLORIDE 0.9 % IV SOLN
INTRAVENOUS | Status: DC
  Administered 2015-08-10: 01:00:00 via INTRAVENOUS

## 2015-08-09 MED ORDER — PIPERACILLIN-TAZOBACTAM 3.375 G IVPB
3.3750 g | Freq: Three times a day (TID) | INTRAVENOUS | Status: DC
  Administered 2015-08-10 (×2): 3.375 g via INTRAVENOUS
  Filled 2015-08-09 (×4): qty 50

## 2015-08-09 MED ORDER — ZINC OXIDE 11.3 % EX CREA
1.0000 "application " | TOPICAL_CREAM | Freq: Three times a day (TID) | CUTANEOUS | Status: DC
  Filled 2015-08-09: qty 56

## 2015-08-09 MED ORDER — DILTIAZEM HCL 25 MG/5ML IV SOLN: 10.0000 mg | Freq: Once | INTRAVENOUS | Status: DC

## 2015-08-09 MED ORDER — SODIUM CHLORIDE 0.9 % IV BOLUS (SEPSIS): 500.0000 mL | Freq: Once | INTRAVENOUS | Status: DC

## 2015-08-09 MED ORDER — SODIUM CHLORIDE 0.9 % IV BOLUS (SEPSIS): 2000.0000 mL | Freq: Once | INTRAVENOUS | Status: DC

## 2015-08-09 MED ORDER — NITROGLYCERIN 0.4 MG SL SUBL: 0.4000 mg | SUBLINGUAL_TABLET | SUBLINGUAL | Status: DC | PRN

## 2015-08-09 MED ORDER — LEVALBUTEROL HCL 1.25 MG/0.5ML IN NEBU
1.2500 mg | INHALATION_SOLUTION | Freq: Four times a day (QID) | RESPIRATORY_TRACT | Status: DC
  Administered 2015-08-10: 1.25 mg via RESPIRATORY_TRACT

## 2015-08-09 MED ORDER — FAMOTIDINE 20 MG PO TABS: 20.0000 mg | ORAL_TABLET | Freq: Two times a day (BID) | ORAL | Status: DC | PRN

## 2015-08-09 MED ORDER — VANCOMYCIN HCL IN DEXTROSE 1-5 GM/200ML-% IV SOLN
1000.0000 mg | INTRAVENOUS | Status: DC
  Filled 2015-08-09: qty 200

## 2015-08-09 MED ORDER — ATORVASTATIN CALCIUM 40 MG PO TABS
40.0000 mg | ORAL_TABLET | Freq: Every day | ORAL | Status: DC
  Administered 2015-08-10: 40 mg via ORAL
  Filled 2015-08-09: qty 1

## 2015-08-09 MED ORDER — METOPROLOL TARTRATE 12.5 MG HALF TABLET
12.5000 mg | ORAL_TABLET | Freq: Two times a day (BID) | ORAL | Status: DC
  Administered 2015-08-10 (×2): 12.5 mg via ORAL
  Filled 2015-08-09 (×2): qty 1

## 2015-08-09 MED ORDER — SODIUM CHLORIDE 0.9 % IV BOLUS (SEPSIS)
500.0000 mL | Freq: Once | INTRAVENOUS | Status: AC
  Administered 2015-08-09: 500 mL via INTRAVENOUS

## 2015-08-09 MED ORDER — PIPERACILLIN-TAZOBACTAM 3.375 G IVPB 30 MIN
3.3750 g | Freq: Once | INTRAVENOUS | Status: AC
  Administered 2015-08-09: 3.375 g via INTRAVENOUS
  Filled 2015-08-09: qty 50

## 2015-08-09 MED ORDER — PIPERACILLIN-TAZOBACTAM 3.375 G IVPB 30 MIN: 3.3750 g | Freq: Three times a day (TID) | INTRAVENOUS | Status: DC

## 2015-08-09 NOTE — ED Notes (Addendum)
PER EMS: pt from [facility]. Staff called out due to patients O2 on 1L [place] at 89%. She was dx with pneumonia 3 weeks ago and placed on 3L [place] and the plan was to wean down her oxygen via [place]. The nurse this morning put her on 1L and when staff checked on her tonight that's when they noticed her low O2. EMS arrived and placed her on 10L [place] and her O2 increased to 100%. No complaints from patients, denies pain/SOB/CP.  Dr. Tamera Punt at bedside upon arrival.

## 2015-08-09 NOTE — ED Notes (Signed)
Dr. Tamera Punt states to place pt on 4L [place].

## 2015-08-09 NOTE — ED Provider Notes (Signed)
CSN: [id number]     Arrival date & time 08/09/15  2048 History   First MD Initiated Contact with Patient 08/09/15 2058     Chief Complaint  Patient presents with  . Hypoxia      (Consider location/radiation/quality/duration/timing/severity/associated sxs/prior Treatment) HPI Comments: Patient is a 81 year old female who currently resides at [facility]. She has a history of MS with resulting quadriplegia. She is bedridden. She was recently admitted about one month ago for healthcare associated pneumonia and UTI. She completed a home course of Augmentin. Her follow-up urine cultures revealed Escherichia coli and she completed a five-day course of Bactrim on January 8. She has been oxygen dependent since her recent admission for pneumonia. She has been on 3 L/m. EMS was called out to the nursing facility for hypoxia. When EMS arrived, she was on 1 L/m of oxygen via nasal cannula. Her oxygen saturations were 80%. She was placed on a nonrebreather mask. Patient is currently denying any symptoms. She is awake and alert and answering questions appropriately. She denies any shortness of breath. She denies any chest pain. She denies any abdominal pain. She denies any recent fevers. She denies any abdominal pain. She denies any nausea or vomiting.   Past Medical History  Diagnosis Date  . Hypertension   . Diabetes mellitus   . GERD (gastroesophageal reflux disease)   . Neuromuscular disorder ([place])   . Multiple sclerosis ([place])   . Anxiety   . Hyperlipemia   . Pneumonia   . Colitis   . Unable to ambulate   . GI bleed 09/07/2012  . Type II or unspecified type diabetes mellitus without mention of complication, uncontrolled 02/03/2013  . Ulcerative colitis, unspecified 02/03/2013  . Chronic diastolic CHF (congestive heart failure) [facility])    Past Surgical History  Procedure Laterality Date  . Tubal ligation    . Flexible sigmoidoscopy  10/05/2011    Procedure: FLEXIBLE SIGMOIDOSCOPY;   Surgeon: Winfield Cunas., MD;  Location: [facility];  Service: Endoscopy;  Laterality: N/A;   pt. coming from [place] phone no. [phone] might come with her  sedation is needed, pt. is paraplegic  . Esophagogastroduodenoscopy N/A 09/09/2012    Procedure: ESOPHAGOGASTRODUODENOSCOPY (EGD);  Surgeon: Winfield Cunas., MD;  Location: [facility] ENDOSCOPY;  Service: Endoscopy;  Laterality: N/A;  . Colonoscopy N/A 09/09/2012    Procedure: COLONOSCOPY;  Surgeon: Winfield Cunas., MD;  Location[facility] ENDOSCOPY;  Service: Endoscopy;  Laterality: N/A;  possible flex sig   Family History  Problem Relation Age of Onset  . CAD Mother   . Diabetes Daughter   . Diabetes Daughter   . Diabetes Son    Social History  Substance Use Topics  . Smoking status: Former Smoker -- 1.50 packs/day for 30 years    Quit date: 11/16/1995  . Smokeless tobacco: Never Used  . Alcohol Use: No   OB History    No data available     Review of Systems  Constitutional: Positive for fatigue. Negative for fever, chills and diaphoresis.  HENT: Negative for congestion, rhinorrhea and sneezing.   Eyes: Negative.   Respiratory: Positive for cough. Negative for chest tightness and shortness of breath.   Cardiovascular: Negative for chest pain and leg swelling.  Gastrointestinal: Negative for nausea, vomiting, abdominal pain, diarrhea and blood in stool.  Genitourinary: Negative for frequency, hematuria, flank pain and difficulty urinating.  Musculoskeletal: Negative for back pain and arthralgias.  Skin: Negative for rash.  Neurological: Negative for dizziness, speech difficulty, weakness, numbness and headaches.      Allergies  Contrast media  Home Medications   Prior to Admission medications   Medication Sig Start Date End Date Taking? Authorizing Provider  acetaminophen (TYLENOL) 325 MG tablet Take 650 mg by mouth every 4 (four) hours as needed for moderate pain.    Yes  Historical Provider, MD  aspirin EC 81 MG EC tablet Take 1 tablet (81 mg total) by mouth daily. 07/11/15  Yes Janece Canterbury, MD  atorvastatin (LIPITOR) 40 MG tablet Take 1 tablet (40 mg total) by mouth daily at 6 PM. 07/11/15  Yes Janece Canterbury, MD  cetirizine (ZYRTEC) 10 MG tablet Take 10 mg by mouth at bedtime.   Yes Historical Provider, MD  famotidine (PEPCID) 20 MG tablet Take 1 tablet (20 mg total) by mouth 2 (two) times daily as needed for heartburn or indigestion. 07/11/15  Yes Janece Canterbury, MD  ferrous sulfate 325 (65 FE) MG EC tablet Take 325 mg by mouth 3 (three) times daily with meals.   Yes Historical Provider, MD  guaiFENesin (MUCINEX) 600 MG 12 hr tablet Take 1 tablet (600 mg total) by mouth 2 (two) times daily as needed for cough or to loosen phlegm. 07/11/15  Yes Janece Canterbury, MD  linagliptin (TRADJENTA) 5 MG TABS tablet Take 1 tablet (5 mg total) by mouth daily. 03/18/15  Yes Lauree Chandler, NP  metFORMIN (GLUCOPHAGE) 500 MG tablet Take 1 tablet (500 mg total) by mouth 2 (two) times daily with a meal. Take one tablet by mouth twice daily to control blood sugar 07/11/15  Yes Janece Canterbury, MD  metoprolol tartrate (LOPRESSOR) 25 MG tablet Take 12.5 mg by mouth 2 (two) times daily. Take 1/2 tablet by mouth daily for blood pressure. HOLD for SBP <110 11/24/11  Yes Lezlie Octave Black, NP  nitroGLYCERIN (NITROSTAT) 0.4 MG SL tablet Place 1 tablet (0.4 mg total) under the tongue every 5 (five) minutes as needed for chest pain. 07/11/15  Yes Janece Canterbury, MD  OXYGEN Inhale 3 L into the lungs continuous. To keep sats >90%   Yes Historical Provider, MD  polyethylene glycol (MIRALAX / GLYCOLAX) packet Take 17 g by mouth 2 (two) times daily. Mix powder in 4-8 oz of liquid once daily for constipation 06/01/15  Yes Lauree Chandler, NP  sulfaDIAZINE 500 MG tablet Take 1,000 mg by mouth 2 (two) times daily. Take two tablets by mouth twice daily with H2O for IBS   Yes Historical Provider, MD   vitamin B-12 100 MCG tablet Take 1 tablet (100 mcg total) by mouth daily. 09/11/12  Yes Monika Salk, MD  zinc oxide (BALMEX) 11.3 % CREA cream Apply 1 application topically 3 (three) times daily. Apply cream topically to groin and buttock every shift as preventative care   Yes Historical Provider, MD   BP 120/60 mmHg  Pulse 120  Temp(Src) 98.4 F (36.9 C) (Rectal)  Resp 21  SpO2 94% Physical Exam  Constitutional: She is oriented to person, place, and time. She appears well-developed and well-nourished.  HENT:  Head: Normocephalic and atraumatic.  Mouth/Throat: Oropharynx is clear and moist.  Eyes: Pupils are equal, round, and reactive to light.  Neck: Normal range of motion. Neck supple.  Cardiovascular: Normal rate, regular rhythm and normal heart sounds.   Pulmonary/Chest: Effort normal and breath sounds normal. No respiratory distress. She has no wheezes. She has no rales. She exhibits no tenderness.  Rhonchi bilaterally  Abdominal:  Soft. Bowel sounds are normal. There is no tenderness. There is no rebound and no guarding.  Musculoskeletal: Normal range of motion. She exhibits edema (1+ pain edema bilaterally).  Lymphadenopathy:    She has no cervical adenopathy.  Neurological: She is alert and oriented to person, place, and time.  Patient with quadriplegia  Skin: Skin is warm and dry. No rash noted.  Psychiatric: She has a normal mood and affect.    ED Course  Procedures (including critical care time) Labs Review Labs Reviewed  BASIC METABOLIC PANEL - Abnormal; Notable for the following:    CO2 33 (*)    Glucose, Bld 140 (*)    Calcium 10.6 (*)    All other components within normal limits  CBC WITH DIFFERENTIAL/PLATELET - Abnormal; Notable for the following:    RBC 5.20 (*)    MCH 25.6 (*)    RDW 21.5 (*)    All other components within normal limits  I-STAT CG4 LACTIC ACID, ED - Abnormal; Notable for the following:    Lactic Acid, Venous 2.39 (*)    All other  components within normal limits  URINE CULTURE  CULTURE, BLOOD (ROUTINE X 2)  CULTURE, BLOOD (ROUTINE X 2)  URINALYSIS, ROUTINE W REFLEX MICROSCOPIC (NOT AT [facility])  I-STAT CG4 LACTIC ACID, ED    Imaging Review Dg Chest 2 View  08/09/2015  CLINICAL DATA:  Hypoxia EXAM: CHEST  2 VIEW COMPARISON:  07/07/2015 FINDINGS: Low lung volumes with bibasilar opacity, partially bandlike on lateral view. Chronic cardiomegaly. Stable aortic contours. Negative for pulmonary edema. IMPRESSION: Low volume chest with bibasilar atelectasis. Superimposed pneumonia or aspiration would be obscured. Electronically Signed   By: Monte Fantasia M.D.   On: 08/09/2015 21:46   I have personally reviewed and evaluated these images and lab results as part of my medical decision-making.   EKG Interpretation   Date/Time:  Sunday August 09 2015 21:06:24 EST Ventricular Rate:  118 PR Interval:  163 QRS Duration: 85 QT Interval:  303 QTC Calculation: 424 R Axis:   61 Text Interpretation:  Sinus tachycardia Probable left atrial enlargement  Confirmed by Abrham Maslowski  MD, Stockton Nunley ([id number]) on 08/09/2015 9:10:29 PM      MDM   Final diagnoses:  HCAP (healthcare-associated pneumonia)  Sepsis, due to unspecified organism [facility])    Patient presents from a skilled nursing facility with hypoxia and tachycardia. She reports an ongoing cough but doesn't report any increased shortness of breath. She is markedly tachycardic in the 120s. She has not been hypotensive. Her oxygen saturation saturations have been in the low to mid 90s on a nasal cannula at 4 L/m which is a little bit more than her baseline oxygen at a nursing facility. Her chest x-ray doesn't show any definite pneumonia but she has low lung volumes. I feel that given her tachycardia, tachypnea, and elevated lactate, it would be prudent to treat her for sepsis and possible healthcare associated pneumonia. Her urinalysis is still pending. I am giving her fluids, but at a slower  rate given her history of CHF. I will consult the hospitalist for admission.  I spoke with Dr. Blaine Hamper who will admit the patient to telemetry bed.    Malvin Johns, MD 08/09/15 2325

## 2015-08-09 NOTE — H&P (Signed)
[facility] History and Physical  [name] [id number] DOB: 12-23-1934 DOA: 08/09/2015  Referring physician: ED physician PCP: Blanchie Serve, MD  Specialists:   Chief Complaint: Oxygen desaturation and cough   HPI: [name] is a 81 y.o. female with PMH of hypertension, diabetes mellitus, GERD, anxiety, IBD, UC, GI bleeding, severe MS with quadriplegia and difficulty speaking, who presents with oxygen desaturation and cough.  Patient was recently hospitalized from 12/27-12/31/16 because of aspiration pneumonia versus HCPA, UTI, demanding ischemia and sepsis. She was discharged to nursing home in stable condition. She completed a home course of Augmentin. She was put on 3L nasal cannula oxygen. She has been doing fine until today when she was found to have hypoxia with desaturation to 80% on 1 L nasal cannula. Patient has been coughing with white mucus production. Denies chest pain and shortness of breath. No fever or chills. Patient does not have abdominal pain, diarrhea, symptoms of UTI.   In ED, patient was found to have WBC 10.2, temperature normal, tachycardia, tachypnea, lactate 2.39, negative urinalysis, electrolytes and renal function okay. His x-ray showed possible bibasilar opacities. Patient is admitted to inpatient for further evaluation treatment.  EKG: Independently reviewed. QTC 424, tachycardia, LAE.  Where does patient live?  SNF Can patient participate in ADLs?  None  Review of Systems:   General: no fevers, chills, no changes in body weight, has fatigue HEENT: no blurry vision, hearing changes or sore throat Pulm: no dyspnea, has coughing, no wheezing CV: no chest pain, palpitations Abd: no nausea, vomiting, abdominal pain, diarrhea, constipation GU: no dysuria, burning on urination, increased urinary frequency, hematuria  Ext: no leg edema Neuro: Has quadriplegia  Skin: no rash MSK: No muscle spasm, no deformity, no limitation of range of movement in  spin Heme: No easy bruising.  Travel history: No recent long distant travel.  Allergy:  Allergies  Allergen Reactions  . Contrast Media [Iodinated Diagnostic Agents] Other (See Comments)    Unknown    Past Medical History  Diagnosis Date  . Hypertension   . Diabetes mellitus   . GERD (gastroesophageal reflux disease)   . Neuromuscular disorder ([place])   . Multiple sclerosis ([place])   . Anxiety   . Hyperlipemia   . Pneumonia   . Colitis   . Unable to ambulate   . GI bleed 09/07/2012  . Type II or unspecified type diabetes mellitus without mention of complication, uncontrolled 02/03/2013  . Ulcerative colitis, unspecified 02/03/2013  . Chronic diastolic CHF (congestive heart failure) ([place])   . NSTEMI (non-ST elevated myocardial infarction) [facility])     Past Surgical History  Procedure Laterality Date  . Tubal ligation    . Flexible sigmoidoscopy  10/05/2011    Procedure: FLEXIBLE SIGMOIDOSCOPY;  Surgeon: Winfield Cunas., MD;  Location: [facility];  Service: Endoscopy;  Laterality: N/A;   pt. coming from [place] daughter phone no. [phone] might come with her  sedation is needed, pt. is paraplegic  . Esophagogastroduodenoscopy N/A 09/09/2012    Procedure: ESOPHAGOGASTRODUODENOSCOPY (EGD);  Surgeon: Winfield Cunas., MD;  Location: [facility] ENDOSCOPY;  Service: Endoscopy;  Laterality: N/A;  . Colonoscopy N/A 09/09/2012    Procedure: COLONOSCOPY;  Surgeon: Winfield Cunas., MD;  Location: [facility] ENDOSCOPY;  Service: Endoscopy;  Laterality: N/A;  possible flex sig    Social History:  reports that she quit smoking about 19 years ago. She has never used smokeless tobacco. She reports that she does not drink alcohol  or use illicit drugs.  Family History:  Family History  Problem Relation Age of Onset  . CAD Mother   . Diabetes Daughter   . Diabetes Daughter   . Diabetes Son      Prior to Admission medications   Medication Sig Start Date End Date  Taking? Authorizing Provider  acetaminophen (TYLENOL) 325 MG tablet Take 650 mg by mouth every 4 (four) hours as needed for moderate pain.    Yes Historical Provider, MD  aspirin EC 81 MG EC tablet Take 1 tablet (81 mg total) by mouth daily. 07/11/15  Yes Janece Canterbury, MD  atorvastatin (LIPITOR) 40 MG tablet Take 1 tablet (40 mg total) by mouth daily at 6 PM. 07/11/15  Yes Janece Canterbury, MD  cetirizine (ZYRTEC) 10 MG tablet Take 10 mg by mouth at bedtime.   Yes Historical Provider, MD  famotidine (PEPCID) 20 MG tablet Take 1 tablet (20 mg total) by mouth 2 (two) times daily as needed for heartburn or indigestion. 07/11/15  Yes Janece Canterbury, MD  ferrous sulfate 325 (65 FE) MG EC tablet Take 325 mg by mouth 3 (three) times daily with meals.   Yes Historical Provider, MD  guaiFENesin (MUCINEX) 600 MG 12 hr tablet Take 1 tablet (600 mg total) by mouth 2 (two) times daily as needed for cough or to loosen phlegm. 07/11/15  Yes Janece Canterbury, MD  linagliptin (TRADJENTA) 5 MG TABS tablet Take 1 tablet (5 mg total) by mouth daily. 03/18/15  Yes Lauree Chandler, NP  metFORMIN (GLUCOPHAGE) 500 MG tablet Take 1 tablet (500 mg total) by mouth 2 (two) times daily with a meal. Take one tablet by mouth twice daily to control blood sugar 07/11/15  Yes Janece Canterbury, MD  metoprolol tartrate (LOPRESSOR) 25 MG tablet Take 12.5 mg by mouth 2 (two) times daily. Take 1/2 tablet by mouth daily for blood pressure. HOLD for SBP <110 11/24/11  Yes Lezlie Octave Black, NP  nitroGLYCERIN (NITROSTAT) 0.4 MG SL tablet Place 1 tablet (0.4 mg total) under the tongue every 5 (five) minutes as needed for chest pain. 07/11/15  Yes Janece Canterbury, MD  OXYGEN Inhale 3 L into the lungs continuous. To keep sats >90%   Yes Historical Provider, MD  polyethylene glycol (MIRALAX / GLYCOLAX) packet Take 17 g by mouth 2 (two) times daily. Mix powder in 4-8 oz of liquid once daily for constipation 06/01/15  Yes Lauree Chandler, NP   sulfaDIAZINE 500 MG tablet Take 1,000 mg by mouth 2 (two) times daily. Take two tablets by mouth twice daily with H2O for IBS   Yes Historical Provider, MD  vitamin B-12 100 MCG tablet Take 1 tablet (100 mcg total) by mouth daily. 09/11/12  Yes Monika Salk, MD  zinc oxide (BALMEX) 11.3 % CREA cream Apply 1 application topically 3 (three) times daily. Apply cream topically to groin and buttock every shift as preventative care   Yes Historical Provider, MD    Physical Exam: Filed Vitals:   08/09/15 2300 08/09/15 2315 08/09/15 2330 08/09/15 2335  BP: 130/70 131/64 137/64   Pulse: 121 114 110   Temp:      TempSrc:      Resp: 25 24 18    Height:    5\' 6"  (1.676 m)  Weight:    54.432 kg (120 lb)  SpO2: 94% 94% 95%    General: Not in acute distress HEENT:       Eyes: PERRL, EOMI, no scleral icterus.  ENT: No discharge from the ears and nose, no pharynx injection, no tonsillar enlargement.        Neck: No JVD, no bruit, no mass felt. Heme: No neck lymph node enlargement. Cardiac: S1/S2, RRR, tachycardia, No murmurs, No gallops or rubs. Pulm: Has crackles bilaterally. No wheezing or rubs. Abd: Soft, nondistended, nontender, no rebound pain, no organomegaly, BS present. Ext: No pitting leg edema bilaterally. 2+DP/PT pulse bilaterally. Musculoskeletal: No joint deformities, No joint redness or warmth, no limitation of ROM in spin. Skin: No rashes.  Neuro: Alert, oriented X3, cranial nerves II-XII grossly intact, quadriplegia Psych: Patient is not psychotic, no suicidal or hemocidal ideation.  Labs on Admission:  Basic Metabolic Panel:  Recent Labs Lab 08/09/15 2207  NA 145  K 4.4  CL 101  CO2 33*  GLUCOSE 140*  BUN 14  CREATININE 0.59  CALCIUM 10.6*   Liver Function Tests: No results for input(s): AST, ALT, ALKPHOS, BILITOT, PROT, ALBUMIN in the last 168 hours. No results for input(s): LIPASE, AMYLASE in the last 168 hours. No results for input(s): AMMONIA in the last 168  hours. CBC:  Recent Labs Lab 08/09/15 2207  WBC 10.2  NEUTROABS 6.6  HGB 13.3  HCT 43.0  MCV 82.7  PLT 266   Cardiac Enzymes: No results for input(s): CKTOTAL, CKMB, CKMBINDEX, TROPONINI in the last 168 hours.  BNP (last 3 results)  Recent Labs  07/07/15 1805  BNP 36.9    ProBNP (last 3 results) No results for input(s): PROBNP in the last 8760 hours.  CBG: No results for input(s): GLUCAP in the last 168 hours.  Radiological Exams on Admission: Dg Chest 2 View  08/09/2015  CLINICAL DATA:  Hypoxia EXAM: CHEST  2 VIEW COMPARISON:  07/07/2015 FINDINGS: Low lung volumes with bibasilar opacity, partially bandlike on lateral view. Chronic cardiomegaly. Stable aortic contours. Negative for pulmonary edema. IMPRESSION: Low volume chest with bibasilar atelectasis. Superimposed pneumonia or aspiration would be obscured. Electronically Signed   By: Monte Fantasia M.D.   On: 08/09/2015 21:46    Assessment/Plan Principal Problem:   Acute respiratory failure with hypoxia (HCC) Active Problems:   Quadriplegia (HCC)   MS (multiple sclerosis) (HCC)   Ulcerative colitis ([place])   GERD (gastroesophageal reflux disease)   Essential hypertension, benign   Dyslipidemia   Anxiety state   Diabetes mellitus type II, controlled, with no complications (HCC)   Sepsis ([place])   Aspiration pneumonia ([place])   HCAP (healthcare-associated pneumonia)   Hypercalcemia   History of non-ST elevation myocardial infarction (NSTEMI)   Principal Problem:   Acute respiratory failure with hypoxia (HCC) Active Problems:   Quadriplegia (HCC)   MS (multiple sclerosis) (HCC)   Ulcerative colitis ([place])   GERD (gastroesophageal reflux disease)   Essential hypertension, benign   Dyslipidemia   Anxiety state   Diabetes mellitus type II, controlled, with no complications (HCC)   Sepsis ([place])   Aspiration pneumonia ([place])   HCAP (healthcare-associated pneumonia)   Hypercalcemia   History of non-ST elevation  myocardial infarction (NSTEMI)   Acute respiratory failure with hypoxia: This is likely due to aspiration vs. HCAP pneumonia. No chest pain or signs of DVT, less likely to have pulmonary embolism. Patient does not have leg edema, less likely to have CHF exacerbation.  - Will admit to tele - IV Vancomycin and Zosyn  - Mucinex for cough  - Xopenex Neb prn for SOB - Urine legionella and S. pneumococcal antigen, flu pcr - Follow up blood culture x2,  sputum culture  - NPO until SLP done  Sepsis 2/2 PNA: Patient meets criteria for sepsis with tachycardia, tachypnea and elevated lacate. Currently hemodynamically stable. -will get Procalcitonin and trend lactic acid levels per sepsis protocol. -IVF: 2..5L of NS bolus in ED, followed by 75 cc/h  -On IV Abx as above  Hx of NSTEMI: No chest pain.  - continue aspirin, metoprolol, Lipitor - When necessary  - trop x 3  Chronic diastolic congestive heart failure: 2-D echo on 07/08/15 showed EF 60-65%. Patient is not taking diuretics at home. No leg edema, CHF is compensated on admission. -Continue metoprolol and ASA - check BNP  MS (multiple sclerosis) ([place]): No acute issues. -Observe closely  GERD: -Protonix  HTN:  -On metoprolol  Ulcerative colitis ([place]) and IBS: stable -continue sulfadiazine  DM-II: Last A1c 6.0 on 07/08/15, well controled. Patient is taking metformin and Tradjenta at home -SSI  Mild hypercalcemia: Calcium 10.6, likely due to dehydration. -IV fluids as above -Follow-up by BMP   DVT ppx: SQ Lovenox  Code Status: DNR Family Communication: Yes, patient's daughter  at bed side Disposition Plan: Admit to inpatient   Date of Service 08/10/2015    Ivor Costa [facility] Pager [phone]  If 7PM-7AM, please contact night-coverage [email or web address] Password [email or web address] 08/10/2015, 12:03 AM

## 2015-08-09 NOTE — Progress Notes (Signed)
ANTIBIOTIC CONSULT NOTE - INITIAL  Pharmacy Consult for Vancomycin / Zosyn Indication: pneumonia  Allergies  Allergen Reactions  . Contrast Media [Iodinated Diagnostic Agents] Other (See Comments)    Unknown    Patient Measurements: Height: 5\' 6"  (167.6 cm) Weight: 120 lb (54.432 kg) IBW/kg (Calculated) : 59.3   Vital Signs: Temp: 98.4 F (36.9 C) (01/29 2229) Temp Source: Rectal (01/29 2229) BP: 137/64 mmHg (01/29 2330) Pulse Rate: 110 (01/29 2330) Intake/Output from previous day:   Intake/Output from this shift: Total I/O In: 700 [IV Piggyback:700] Out: -   Labs:  Recent Labs  08/09/15 2207  WBC 10.2  HGB 13.3  PLT 266  CREATININE 0.59   Estimated Creatinine Clearance: 49.8 mL/min (by C-G formula based on Cr of 0.59). No results for input(s): VANCOTROUGH, VANCOPEAK, VANCORANDOM, GENTTROUGH, GENTPEAK, GENTRANDOM, TOBRATROUGH, TOBRAPEAK, TOBRARND, AMIKACINPEAK, AMIKACINTROU, AMIKACIN in the last 72 hours.   Microbiology: No results found for this or any previous visit (from the past 720 hour(s)).  Medical History: Past Medical History  Diagnosis Date  . Hypertension   . Diabetes mellitus   . GERD (gastroesophageal reflux disease)   . Neuromuscular disorder ([place])   . Multiple sclerosis ([place])   . Anxiety   . Hyperlipemia   . Pneumonia   . Colitis   . Unable to ambulate   . GI bleed 09/07/2012  . Type II or unspecified type diabetes mellitus without mention of complication, uncontrolled 02/03/2013  . Ulcerative colitis, unspecified 02/03/2013  . Chronic diastolic CHF (congestive heart failure) [facility])      Assessment: 81 year old female to continue Vancomycin and Zosyn for HCAP Doses received at 2230 pm  Goal of Therapy:  Vancomycin trough level 15-20 mcg/ml  Appropriate Zosyn dosing  Plan:  Zosyn 3.375 grams iv Q 8 hours - 4 hr infusion Vancomycin 1 gram iv Q 24 hours Follow up Scr, cultures, fever trend  Thank you Anette Guarneri,  PharmD [phone]  08/09/2015,11:49 PM

## 2015-08-10 ENCOUNTER — Encounter (HOSPITAL_COMMUNITY): Payer: Self-pay | Admitting: *Deleted

## 2015-08-10 DIAGNOSIS — J9601 Acute respiratory failure with hypoxia: Secondary | ICD-10-CM

## 2015-08-10 DIAGNOSIS — J189 Pneumonia, unspecified organism: Secondary | ICD-10-CM

## 2015-08-10 DIAGNOSIS — G8250 Quadriplegia, unspecified: Secondary | ICD-10-CM

## 2015-08-10 DIAGNOSIS — A419 Sepsis, unspecified organism: Principal | ICD-10-CM

## 2015-08-10 DIAGNOSIS — G35 Multiple sclerosis: Secondary | ICD-10-CM

## 2015-08-10 DIAGNOSIS — K5100 Ulcerative (chronic) pancolitis without complications: Secondary | ICD-10-CM

## 2015-08-10 DIAGNOSIS — J690 Pneumonitis due to inhalation of food and vomit: Secondary | ICD-10-CM

## 2015-08-10 DIAGNOSIS — Z7189 Other specified counseling: Secondary | ICD-10-CM

## 2015-08-10 DIAGNOSIS — Z515 Encounter for palliative care: Secondary | ICD-10-CM

## 2015-08-10 DIAGNOSIS — I10 Essential (primary) hypertension: Secondary | ICD-10-CM

## 2015-08-10 LAB — PROCALCITONIN

## 2015-08-10 LAB — TROPONIN I: Troponin I: <0.03 ng/mL (ref <0.031)

## 2015-08-10 LAB — INFLUENZA PANEL BY PCR (TYPE A & B)
H1N1FLUPCR: NOT DETECTED
INFLAPCR: NEGATIVE
INFLBPCR: NEGATIVE

## 2015-08-10 LAB — GLUCOSE, CAPILLARY
Glucose-Capillary: 113 mg/dL — ABNORMAL HIGH (ref 65–99)
Glucose-Capillary: 141 mg/dL — ABNORMAL HIGH (ref 65–99)
Glucose-Capillary: 108 mg/dL — ABNORMAL HIGH (ref 65–99)

## 2015-08-10 LAB — BRAIN NATRIURETIC PEPTIDE: B NATRIURETIC PEPTIDE 5: 20.7 pg/mL (ref 0.0–100.0)

## 2015-08-10 LAB — APTT: aPTT: 30 seconds (ref 24–37)

## 2015-08-10 LAB — PROTIME-INR
INR: 1.03 (ref 0.00–1.49)
Prothrombin Time: 13.8 seconds (ref 11.6–15.2)

## 2015-08-10 LAB — LACTIC ACID, PLASMA
LACTIC ACID, VENOUS: 1.5 mmol/L (ref 0.5–2.0)
LACTIC ACID, VENOUS: 1.8 mmol/L (ref 0.5–2.0)

## 2015-08-10 LAB — MRSA PCR SCREENING: MRSA by PCR: NEGATIVE

## 2015-08-10 MED ORDER — LEVALBUTEROL HCL 1.25 MG/0.5ML IN NEBU
1.2500 mg | INHALATION_SOLUTION | Freq: Four times a day (QID) | RESPIRATORY_TRACT | Status: DC
  Administered 2015-08-10 (×2): 1.25 mg via RESPIRATORY_TRACT
  Filled 2015-08-10 (×4): qty 0.5

## 2015-08-10 MED ORDER — RESOURCE THICKENUP CLEAR PO POWD
ORAL | Status: DC | PRN
  Filled 2015-08-10: qty 125

## 2015-08-10 NOTE — Progress Notes (Signed)
Patient arrived from [facility] ED. Alert and oriented X 4. No complaints of pain. Patient is quadriplegic from [facility]. No skin breakdown noted. Slight redness to bottom. Sinus tach on telemetry. Patient is a DNR. VSS.

## 2015-08-10 NOTE — Progress Notes (Signed)
Pt has orders to be discharged back to [facility]. Report called. Discharge instructions given and pt has no additional questions at this time. Medication regimen reviewed and pt educated. Pt verbalized understanding and has no additional questions. Telemetry box removed. IV removed and site in good condition. Pt stable and waiting for transportation back to [facility].  Maurene Capes RN

## 2015-08-10 NOTE — Progress Notes (Signed)
[facility] Progress Note   [name]  [id number]  DOB: 1934-12-28  DOA: 08/09/2015 PCP: Blanchie Serve, MD  Brief narrative: [name] is a 81 y.o. female with hypertension, diabetes mellitus, severe multiple sclerosis with quadriplegia and aspiration issues who presents to the hospital with hypoxia and cough. Chest x-ray reveals bibasilar infiltrates which could possibly be pneumonia. The patient was recently admitted in December and treated for pneumonia, UTI and sepsis.   Subjective: Continues to have cough with white sputum. No complaints of shortness of breath. Wants to leave the hospital.  Assessment/Plan: Principal Problem:   Acute respiratory failure with hypoxia/aspiration/dysphagia/failure to thrive- sepsis secondary to pneumonia. -Likely secondary to continued aspiration-speech therapy has evaluated her this morning and is again recommending thickened liquids however the patient does not agree to taking liquids - currently on 4 L O2 with pulse ox of 92% -In addition, the patient states that she has no appetite and has lost almost 20 pounds in the past few weeks -I have had a discussion with her power of attorney who is her daughter [name] and we have discussed ongoing risks of aspiration and continued physical decline. I have recommended palliative care and at this time she is in agreement with speaking with the palliative care team.  Active Problems:   Quadriplegia -  MS (multiple sclerosis)  - at baseline    Ulcerative colitis  - cont sulfadiazine    GERD (gastroesophageal reflux disease) - Protonix     Essential hypertension, benign - cont Metoprolol    Diabetes mellitus type II, controlled, with no complications  - cont ISS  Antibiotics: Anti-infectives    Start     Dose/Rate Route Frequency Ordered Stop   08/10/15 2230  vancomycin (VANCOCIN) IVPB 1000 mg/200 mL premix     1,000 mg 200 mL/hr over 60 Minutes Intravenous Every 24 hours 08/09/15  2354     08/10/15 1000  sulfaDIAZINE tablet 1,000 mg     1,000 mg Oral 2 times daily 08/09/15 2339     08/10/15 0630  piperacillin-tazobactam (ZOSYN) IVPB 3.375 g     3.375 g 12.5 mL/hr over 240 Minutes Intravenous Every 8 hours 08/09/15 2354     08/09/15 2345  piperacillin-tazobactam (ZOSYN) IVPB 3.375 g  Status:  Discontinued     3.375 g 100 mL/hr over 30 Minutes Intravenous 3 times per day 08/09/15 2339 08/09/15 2350   08/09/15 2230  piperacillin-tazobactam (ZOSYN) IVPB 3.375 g     3.375 g 100 mL/hr over 30 Minutes Intravenous  Once 08/09/15 2224 08/09/15 2308   08/09/15 2230  vancomycin (VANCOCIN) IVPB 1000 mg/200 mL premix     1,000 mg 200 mL/hr over 60 Minutes Intravenous  Once 08/09/15 2224 08/09/15 2346     Code Status:     Code Status Orders        Start     Ordered   08/09/15 2342  Do not attempt resuscitation (DNR)   Continuous    Question Answer Comment  In the event of cardiac or respiratory ARREST Do not call a "code blue"   In the event of cardiac or respiratory ARREST Do not perform Intubation, CPR, defibrillation or ACLS   In the event of cardiac or respiratory ARREST Use medication by any route, position, wound care, and other measures to relive pain and suffering. May use oxygen, suction and manual treatment of airway obstruction as needed for comfort.      08/09/15 2342    Code Status  History    Date Active Date Inactive Code Status Order ID Comments User Context   07/09/2015  8:10 AM 07/11/2015  1:41 PM DNR [id number]  Janece Canterbury, MD Inpatient   07/07/2015  8:54 PM 07/09/2015  8:10 AM Full Code [id number]  Ivor Costa, MD ED   09/16/2013 11:07 PM 07/07/2015  8:54 PM Full Code [id number]  Gerlene Fee, NP Outpatient   09/07/2012  9:10 PM 09/11/2012  6:13 PM Full Code [id number]  Theressa Millard, MD ED   11/18/2011 10:06 AM 11/24/2011  6:03 PM Full Code [id number]  Eugenie Filler, MD Inpatient    Advance Directive Documentation        Most Recent Value    Type of Advance Directive  Healthcare Power of Attorney, Living will   Pre-existing out of facility DNR order (yellow form or pink MOST form)     "MOST" Form in Place?       Family Communication: daughter [name] Disposition Plan: return to SNF DVT prophylaxis: Lovenox  Consultants: palliative Procedures:     Objective: Filed Weights   08/09/15 2335 08/10/15 0015  Weight: 54.432 kg (120 lb) 50.803 kg (112 lb)    Intake/Output Summary (Last 24 hours) at 08/10/15 1106 Last data filed at 08/10/15 0752  Gross per 24 hour  Intake 1101.25 ml  Output      0 ml  Net 1101.25 ml     Vitals Filed Vitals:   08/10/15 0030 08/10/15 0524 08/10/15 0900 08/10/15 1020  BP:  122/65 122/65   Pulse:  123 99   Temp:  97.8 F (36.6 C) 97.8 F (36.6 C)   TempSrc:  Oral Oral   Resp:  18 20   Height:      Weight:      SpO2: 93% 93% 94% 97%    Exam:  General:  Pt is alert, not in acute distress  HEENT: No icterus, No thrush, oral mucosa moist  Cardiovascular: regular rate and rhythm, S1/S2 No murmur  Respiratory: clear to auscultation bilaterally   Abdomen: Soft, +Bowel sounds, non tender, non distended, no guarding  MSK: No cyanosis or clubbing- no pedal edema   Data Reviewed: Basic Metabolic Panel:  Recent Labs Lab 08/09/15 2207  NA 145  K 4.4  CL 101  CO2 33*  GLUCOSE 140*  BUN 14  CREATININE 0.59  CALCIUM 10.6*   Liver Function Tests: No results for input(s): AST, ALT, ALKPHOS, BILITOT, PROT, ALBUMIN in the last 168 hours. No results for input(s): LIPASE, AMYLASE in the last 168 hours. No results for input(s): AMMONIA in the last 168 hours. CBC:  Recent Labs Lab 08/09/15 2207  WBC 10.2  NEUTROABS 6.6  HGB 13.3  HCT 43.0  MCV 82.7  PLT 266   Cardiac Enzymes:  Recent Labs Lab 08/10/15 0052 08/10/15 0548  TROPONINI <0.03 <0.03   BNP (last 3 results)  Recent Labs  07/07/15 1805 08/10/15 0548  BNP 36.9 20.7    ProBNP (last 3 results) No  results for input(s): PROBNP in the last 8760 hours.  CBG:  Recent Labs Lab 08/10/15 0600  GLUCAP 113*    Recent Results (from the past 240 hour(s))  MRSA PCR Screening     Status: None   Collection Time: 08/10/15 12:25 AM  Result Value Ref Range Status   MRSA by PCR NEGATIVE NEGATIVE Final    Comment:        The GeneXpert MRSA Assay (FDA approved for NASAL specimens only),  is one component of a comprehensive MRSA colonization surveillance program. It is not intended to diagnose MRSA infection nor to guide or monitor treatment for MRSA infections.      Studies: Dg Chest 2 View  08/09/2015  CLINICAL DATA:  Hypoxia EXAM: CHEST  2 VIEW COMPARISON:  07/07/2015 FINDINGS: Low lung volumes with bibasilar opacity, partially bandlike on lateral view. Chronic cardiomegaly. Stable aortic contours. Negative for pulmonary edema. IMPRESSION: Low volume chest with bibasilar atelectasis. Superimposed pneumonia or aspiration would be obscured. Electronically Signed   By: Monte Fantasia M.D.   On: 08/09/2015 21:46    Scheduled Meds:  Scheduled Meds: . aspirin EC  81 mg Oral Daily  . atorvastatin  40 mg Oral q1800  . enoxaparin (LOVENOX) injection  40 mg Subcutaneous Q24H  . ferrous sulfate  325 mg Oral TID WC  . insulin aspart  0-9 Units Subcutaneous TID WC  . levalbuterol  1.25 mg Nebulization QID  . loratadine  10 mg Oral Daily  . metoprolol tartrate  12.5 mg Oral BID  . piperacillin-tazobactam (ZOSYN)  IV  3.375 g Intravenous Q8H  . polyethylene glycol  17 g Oral BID  . sodium chloride  2,000 mL Intravenous Once  . sulfaDIAZINE  1,000 mg Oral BID  . vancomycin  1,000 mg Intravenous Q24H  . vitamin B-12  100 mcg Oral Daily   Continuous Infusions: . sodium chloride 75 mL/hr at 08/10/15 0044    Time spent on care of this patient: 35 min   [place], MD 08/10/2015, 11:06 AM  LOS: 1 day   [facility] Office  [phone] Pager - Text Page per [email or web address] If  7PM-7AM, please contact night-coverage [email or web address]

## 2015-08-10 NOTE — Progress Notes (Signed)
PT Cancellation Note  Patient Details Name: [name] MRN: [id number] DOB: 1934-09-25   Cancelled Treatment:     Spoke with nsg, patient is and SNF resident with severe multiple sclerosis with quadriplegia. Not appropriate for therapy evaluation at this time, will defer back to SNF for custodial care.   Duncan Dull 08/10/2015, 12:31 PM  Alben Deeds, [place] DPT  [phone]

## 2015-08-10 NOTE — Discharge Summary (Signed)
Physician Discharge Summary  [name] [id number] DOB: [birth year] DOA: 08/09/2015  PCP: Blanchie Serve, MD  Admit date: 08/09/2015 Discharge date: 08/10/2015  Time spent: 50 minutes  Recommendations for Outpatient Follow-up:  1. Will return to SNF with hospice   Discharge Condition: fair    Discharge Diagnoses:  Principal Problem:   Aspiration pneumonia ([place]) Active Problems:   Acute respiratory failure with hypoxia ([place])   Quadriplegia ([place])   MS (multiple sclerosis) ([place])   Ulcerative colitis ([place])   GERD (gastroesophageal reflux disease)   Essential hypertension, benign   Dyslipidemia   Anxiety state   Diabetes mellitus type II, controlled, with no complications (HCC)   Sepsis ([place])   Hypercalcemia   History of non-ST elevation myocardial infarction (NSTEMI)   History of present illness:  [name] is a 81 y.o. female with hypertension, diabetes mellitus, severe multiple sclerosis with quadriplegia and aspiration issues who presents to the hospital with hypoxia and cough. Chest x-ray reveals bibasilar infiltrates which could possibly be pneumonia. The patient was recently admitted in December and treated for pneumonia, UTI and sepsis.  Hospital Course:  Principal Problem: Acute respiratory failure with hypoxia/aspiration/dysphagia Failure to thrive Sepsis secondary to pneumonia. -Likely secondary to continued aspiration-speech therapy has evaluated her this morning and is again recommending thickened liquids however the patient does not agree to taking liquids - currently on 4 L O2 with pulse ox of 92% -In addition, the patient states that she has no appetite and has lost almost 20 pounds in the past few weeks -I have had a discussion with her power of attorney who is her daughter [name] and we have discussed ongoing risks of aspiration and continued physical decline. She has spoken with the palliative care team and it has been decided that antibiotics should be  stopped and patient will return to SNF with hospice.   Active Problems:  Quadriplegia - MS (multiple sclerosis)  - at baseline   Ulcerative colitis  - cont sulfadiazine   GERD (gastroesophageal reflux disease) - Protonix    Essential hypertension, benign - cont Metoprolol   Diabetes mellitus type II, controlled, with no complications  - cont ISS   Procedures:  none  Consultations:  Palliative care  Discharge Exam: Filed Weights   08/09/15 2335 08/10/15 0015  Weight: 54.432 kg (120 lb) 50.803 kg (112 lb)   Filed Vitals:   08/10/15 0900 08/10/15 1120  BP: 122/65 116/60  Pulse: 99 98  Temp: 97.8 F (36.6 C) 97.8 F (36.6 C)  Resp: 20 18    General: AAO x 3, no distress Cardiovascular: RRR, no murmurs  Respiratory: clear to auscultation bilaterally GI: soft, non-tender, non-distended, bowel sound positive  Discharge Instructions You were cared for by a hospitalist during your hospital stay. If you have any questions about your discharge medications or the care you received while you were in the hospital after you are discharged, you can call the unit and asked to speak with the hospitalist on call if the hospitalist that took care of you is not available. Once you are discharged, your primary care physician will handle any further medical issues. Please note that NO REFILLS for any discharge medications will be authorized once you are discharged, as it is imperative that you return to your primary care physician (or establish a relationship with a primary care physician if you do not have one) for your aftercare needs so that they can reassess your need for medications and monitor your lab values.  Discharge Instructions    Discharge instructions    Complete by:  As directed   Regular diet     Increase activity slowly    Complete by:  As directed             Medication List    TAKE these medications        acetaminophen 325 MG tablet  Commonly  known as:  TYLENOL  Take 650 mg by mouth every 4 (four) hours as needed for moderate pain.     aspirin 81 MG EC tablet  Take 1 tablet (81 mg total) by mouth daily.     atorvastatin 40 MG tablet  Commonly known as:  LIPITOR  Take 1 tablet (40 mg total) by mouth daily at 6 PM.     cetirizine 10 MG tablet  Commonly known as:  ZYRTEC  Take 10 mg by mouth at bedtime.     cyanocobalamin 100 MCG tablet  Take 1 tablet (100 mcg total) by mouth daily.     famotidine 20 MG tablet  Commonly known as:  PEPCID  Take 1 tablet (20 mg total) by mouth 2 (two) times daily as needed for heartburn or indigestion.     ferrous sulfate 325 (65 FE) MG EC tablet  Take 325 mg by mouth 3 (three) times daily with meals.     guaiFENesin 600 MG 12 hr tablet  Commonly known as:  MUCINEX  Take 1 tablet (600 mg total) by mouth 2 (two) times daily as needed for cough or to loosen phlegm.     linagliptin 5 MG Tabs tablet  Commonly known as:  TRADJENTA  Take 1 tablet (5 mg total) by mouth daily.     metFORMIN 500 MG tablet  Commonly known as:  GLUCOPHAGE  Take 1 tablet (500 mg total) by mouth 2 (two) times daily with a meal. Take one tablet by mouth twice daily to control blood sugar     metoprolol tartrate 25 MG tablet  Commonly known as:  LOPRESSOR  Take 12.5 mg by mouth 2 (two) times daily. Take 1/2 tablet by mouth daily for blood pressure. HOLD for SBP <110     nitroGLYCERIN 0.4 MG SL tablet  Commonly known as:  NITROSTAT  Place 1 tablet (0.4 mg total) under the tongue every 5 (five) minutes as needed for chest pain.     OXYGEN  Inhale 3 L into the lungs continuous. To keep sats >90%     polyethylene glycol packet  Commonly known as:  MIRALAX / GLYCOLAX  Take 17 g by mouth 2 (two) times daily. Mix powder in 4-8 oz of liquid once daily for constipation     sulfaDIAZINE 500 MG tablet  Take 1,000 mg by mouth 2 (two) times daily. Take two tablets by mouth twice daily with H2O for IBS     zinc oxide  11.3 % Crea cream  Commonly known as:  BALMEX  Apply 1 application topically 3 (three) times daily. Apply cream topically to groin and buttock every shift as preventative care       Allergies  Allergen Reactions  . Contrast Media [Iodinated Diagnostic Agents] Other (See Comments)    Unknown      The results of significant diagnostics from this hospitalization (including imaging, microbiology, ancillary and laboratory) are listed below for reference.    Significant Diagnostic Studies: Dg Chest 2 View  08/09/2015  CLINICAL DATA:  Hypoxia EXAM: CHEST  2 VIEW COMPARISON:  07/07/2015 FINDINGS: Low  lung volumes with bibasilar opacity, partially bandlike on lateral view. Chronic cardiomegaly. Stable aortic contours. Negative for pulmonary edema. IMPRESSION: Low volume chest with bibasilar atelectasis. Superimposed pneumonia or aspiration would be obscured. Electronically Signed   By: Monte Fantasia M.D.   On: 08/09/2015 21:46    Microbiology: Recent Results (from the past 240 hour(s))  Urine culture     Status: None (Preliminary result)   Collection Time: 08/09/15 10:30 PM  Result Value Ref Range Status   Specimen Description URINE, CATHETERIZED  Final   Special Requests NONE  Final   Culture NO GROWTH < 12 HOURS  Final   Report Status PENDING  Incomplete  MRSA PCR Screening     Status: None   Collection Time: 08/10/15 12:25 AM  Result Value Ref Range Status   MRSA by PCR NEGATIVE NEGATIVE Final    Comment:        The GeneXpert MRSA Assay (FDA approved for NASAL specimens only), is one component of a comprehensive MRSA colonization surveillance program. It is not intended to diagnose MRSA infection nor to guide or monitor treatment for MRSA infections.      Labs: Basic Metabolic Panel:  Recent Labs Lab 08/09/15 2207  NA 145  K 4.4  CL 101  CO2 33*  GLUCOSE 140*  BUN 14  CREATININE 0.59  CALCIUM 10.6*   Liver Function Tests: No results for input(s): AST, ALT,  ALKPHOS, BILITOT, PROT, ALBUMIN in the last 168 hours. No results for input(s): LIPASE, AMYLASE in the last 168 hours. No results for input(s): AMMONIA in the last 168 hours. CBC:  Recent Labs Lab 08/09/15 2207  WBC 10.2  NEUTROABS 6.6  HGB 13.3  HCT 43.0  MCV 82.7  PLT 266   Cardiac Enzymes:  Recent Labs Lab 08/10/15 0052 08/10/15 0548 08/10/15 1211  TROPONINI <0.03 <0.03 <0.03   BNP: BNP (last 3 results)  Recent Labs  07/07/15 1805 08/10/15 0548  BNP 36.9 20.7    ProBNP (last 3 results) No results for input(s): PROBNP in the last 8760 hours.  CBG:  Recent Labs Lab 08/10/15 0600 08/10/15 1119  GLUCAP 113* 141*       SignedDebbe Odea, MD [facility] 08/10/2015, 4:32 PM

## 2015-08-10 NOTE — Evaluation (Signed)
Clinical/Bedside Swallow Evaluation Patient Details  Name: [name] MRN: [id number] Date of Birth: 01-Nov-1934  Today's Date: 08/10/2015 Time: SLP Start Time (ACUTE ONLY): 68 SLP Stop Time (ACUTE ONLY): 0940 SLP Time Calculation (min) (ACUTE ONLY): 25 min  Past Medical History:  Past Medical History  Diagnosis Date  . Hypertension   . Diabetes mellitus   . GERD (gastroesophageal reflux disease)   . Neuromuscular disorder ([place])   . Multiple sclerosis ([place])   . Anxiety   . Hyperlipemia   . Pneumonia   . Colitis   . Unable to ambulate   . GI bleed 09/07/2012  . Type II or unspecified type diabetes mellitus without mention of complication, uncontrolled 02/03/2013  . Ulcerative colitis, unspecified 02/03/2013  . Chronic diastolic CHF (congestive heart failure) ([place])   . NSTEMI (non-ST elevated myocardial infarction) [facility])    Past Surgical History:  Past Surgical History  Procedure Laterality Date  . Tubal ligation    . Flexible sigmoidoscopy  10/05/2011    Procedure: FLEXIBLE SIGMOIDOSCOPY;  Surgeon: Winfield Cunas., MD;  Location: [facility] END[facility];  Service: Endoscopy;  Laterality: N/A;   pt. coming from [place] daughter phone no. [phone] might come with her  sedation is needed, pt. is paraplegic  . Esophagogastroduodenoscopy N/A 09/09/2012    Procedure: ESOPHAGOGASTRODUODENOSCOPY (EGD);  Surgeon: Winfield Cunas., MD;  Location: [facility] ENDOSCOPY;  Service: Endoscopy;  Laterality: N/A;  . Colonoscopy N/A 09/09/2012    Procedure: COLONOSCOPY;  Surgeon: Winfield Cunas., MD;  Location[facility] END[facility]COPY;  Service: Endoscopy;  Laterality: N/A;  possible flex sig   HPI:  Pt is 81 y.o. female with h/o hypertension, DM, GERD, IBD, GI bleeding, severe MS with quadriplegia and difficulty speaking. Pt presented to ED with oxygen desaturation and cough. Recent hospital stay (12/27-12/31/2016) due to aspiration pneumonia vs HCPA, UTI, demanding ischemia and sepsis.  CXR 1/29 showed bibasilar atelectasis. MBSS 07/08/2015 showed mod-severe sensory>motor pharyngeal dysphagia characterized by delayed initiation of the swallow and reduced awareness of bolus.   Assessment / Plan / Recommendation Clinical Impression  SLP provided POs at bedside to assess swallow function from last instrumental study done 07/08/2015 which noted severe aspiration risk even with thickened liquids and modified diet. Pt reported change to regular diet following return to nursing facility after last hospital admission. Multiple dry swallows and immediate coughing were observed throughout with puree and liquids (thin, nectar thick, honey thick). Volitional cough weak and likely inadequate to clear possible penetrates. Pt reported dislike for thickened liquids and modified diet and would prefer regular diet and thin liquids. Pt educated re: diet recommendation and aspiration precautions, despite education lacks understanding of situation. Recommend regular diet and thin liquids with known aspiration risk given chronic dysphagia, however order set for Dysphagia 2 (fine chop) diet and honey thick liquids until pallative consulted. D/w MD and family. SLP will f/u to advise diet advancement and further educate as needed.    Aspiration Risk  Severe aspiration risk    Diet Recommendation Dysphagia 2 (Fine chop);Honey-thick liquid   Liquid Administration via: Cup Medication Administration: Whole meds with puree Supervision: Full supervision/cueing for compensatory strategies Compensations: Slow rate;Small sips/bites Postural Changes: Seated upright at 90 degrees    Other  Recommendations Recommended Consults: Other (Comment) (pallative) Oral Care Recommendations: Oral care BID Other Recommendations: Order thickener from pharmacy   Follow up Recommendations  Skilled Nursing facility    Frequency and Duration min 2x/week  2 weeks  Prognosis Prognosis for Safe Diet Advancement:  Fair Barriers to Reach Goals: Severity of deficits      Swallow Study   General HPI: Pt is 81 y.o. female with h/o hypertension, DM, GERD, IBD, GI bleeding, severe MS with quadriplegia and difficulty speaking. Pt presented to ED with oxygen desaturation and cough. Recent hospital stay (12/27-12/31/2016) due to aspiration pneumonia vs HCPA, UTI, demanding ischemia and sepsis. CXR 1/29 showed bibasilar atelectasis. MBSS 07/08/2015 showed mod-severe sensory>motor pharyngeal dysphagia characterized by delayed initiation of the swallow and reduced awareness of bolus. Type of Study: Bedside Swallow Evaluation Previous Swallow Assessment: MBSS 12/28, severe aspiration risk even with thickened liquids and downgraded diet Diet Prior to this Study: NPO Temperature Spikes Noted: No Respiratory Status: Nasal cannula History of Recent Intubation: No Behavior/Cognition: Alert;Cooperative Oral Cavity Assessment: Within Functional Limits Oral Care Completed by SLP: No Oral Cavity - Dentition: Adequate natural dentition Vision: Functional for self-feeding Self-Feeding Abilities: Total assist Patient Positioning: Upright in bed Baseline Vocal Quality: Low vocal intensity Volitional Cough: Weak Volitional Swallow: Able to elicit    Oral/Motor/Sensory Function Overall Oral Motor/Sensory Function: Within functional limits   Ice Chips Ice chips: Not tested   Thin Liquid Thin Liquid: Impaired Presentation: Cup Oral Phase Impairments: Poor awareness of bolus Pharyngeal  Phase Impairments: Decreased hyoid-laryngeal movement;Multiple swallows;Cough - Immediate    Nectar Thick Nectar Thick Liquid: Impaired Presentation: Cup Pharyngeal Phase Impairments: Decreased hyoid-laryngeal movement;Multiple swallows;Cough - Immediate   Honey Thick Honey Thick Liquid: Impaired Presentation: Cup Pharyngeal Phase Impairments: Multiple swallows   Puree Puree: Impaired Presentation: Spoon Pharyngeal Phase Impairments:  Decreased hyoid-laryngeal movement;Multiple swallows   Solid   GO   Solid: Not tested        Marlene Beidler 08/10/2015,10:08 AM  Titus Mould, Student-SLP

## 2015-08-10 NOTE — Consult Note (Signed)
Consultation Note Date: 08/10/2015   Patient Name: [name]  DOB: Jan 01, 1935  MRN: [id number]  Age / Sex: 81 y.o., female  PCP: Blanchie Serve, MD Referring Physician: Debbe Odea, MD  Reason for Consultation: Establishing goals of care    Clinical Assessment/Narrative:  [name] is a 81 y.o. female with hypertension, diabetes mellitus, severe multiple sclerosis with quadriplegia and aspiration issues who presents to the hospital with hypoxia and cough. Chest x-ray reveals bibasilar infiltrates which could possibly be pneumonia. The patient was recently admitted in December and treated for pneumonia, UTI and sepsis.   The patient has been admitted to the hospitalist service for acute respiratory failure with hypoxia/aspiration/dysphagia/failure to thrive- sepsis secondary to pneumonia. -Likely secondary to continued aspiration-speech therapy evaluation notes dysphagia 2 diet, honey thick liquids, medications in applesauce.     The patient states that she has no appetite and has lost almost 20 pounds in the past few weeks. Palliative consult for further goals of care discussions.   The patient is resting in bed, she is awake alert. Brief life review performed: patient has worked in Software engineer, she has had 3 daughters and one son, she is widowed.   Discussed in family meeting with patient and her children:  Patient with gradual progressive decline, weight loss, cognitive decline. She is bed bound at baseline.  MOST form discussed.  Patient elects to DNR DNI no PEG, no transfers to hospital, no antibiotics.  She also accepts hospice as an extra layer of support.    All questions answered to the best of my ability.  Contacts/Participants in Discussion: Primary Decision Maker:     Relationship to Patient   HCPOA: yes   daughter [name] [id number]   SUMMARY OF RECOMMENDATIONS: DNR DNI no PEG tube.    Patient does not want IV Antibiotics, does not want to come back to the hospital Hospice consult Consider D/C back to [facility] place with hospice support Supplemental O2 prn for comfort    Code Status/Advance Care Planning: DNR    Code Status Orders        Start     Ordered   08/09/15 2342  Do not attempt resuscitation (DNR)   Continuous    Question Answer Comment  In the event of cardiac or respiratory ARREST Do not call a "code blue"   In the event of cardiac or respiratory ARREST Do not perform Intubation, CPR, defibrillation or ACLS   In the event of cardiac or respiratory ARREST Use medication by any route, position, wound care, and other measures to relive pain and suffering. May use oxygen, suction and manual treatment of airway obstruction as needed for comfort.      08/09/15 2342    Code Status History    Date Active Date Inactive Code Status Order ID Comments User Context   07/09/2015  8:10 AM 07/11/2015  1:41 PM DNR [id number]  Janece Canterbury, MD Inpatient   07/07/2015  8:54 PM 07/09/2015  8:10 AM Full Code [id number]  Ivor Costa, MD ED   09/16/2013 11:07 PM 07/07/2015  8:54 PM Full Code [id number]  Gerlene Fee, NP Outpatient   09/07/2012  9:10 PM 09/11/2012  6:13 PM Full Code [id number]  Theressa Millard, MD ED   11/18/2011 10:06 AM 11/24/2011  6:03 PM Full Code [id number]  Eugenie Filler, MD Inpatient    Advance Directive Documentation        Most Recent Value   Type of  Pension scheme manager Power of Attorney, Living will   Pre-existing out of facility DNR order (yellow form or pink MOST form)     "MOST" Form in Place?        Other Directives:Other  Symptom Management:    as above   Palliative Prophylaxis:   Bowel Regimen  Additional Recommendations (Limitations, Scope, Preferences):  Back to facility with hospice  Psycho-social/Spiritual:  Support System: [place] for further Chaplaincy support:no Additional Recommendations: Caregiving   Support/Resources  Prognosis:  ? Weeks to months.   Discharge Planning: [place] with Hospice   Chief Complaint/ Primary Diagnoses: Present on Admission:  . (Resolved) HCAP (healthcare-associated pneumonia) . Quadriplegia ([place]) . MS (multiple sclerosis) ([place]) . Ulcerative colitis ([place]) . GERD (gastroesophageal reflux disease) . Essential hypertension, benign . Dyslipidemia . Anxiety state . Sepsis ([place]) . Acute respiratory failure with hypoxia ([place]) . Aspiration pneumonia ([place]) . Hypercalcemia  I have reviewed the medical record, interviewed the patient and family, and examined the patient. The following aspects are pertinent.  Past Medical History  Diagnosis Date  . Hypertension   . Diabetes mellitus   . GERD (gastroesophageal reflux disease)   . Neuromuscular disorder ([place])   . Multiple sclerosis ([place])   . Anxiety   . Hyperlipemia   . Pneumonia   . Colitis   . Unable to ambulate   . GI bleed 09/07/2012  . Type II or unspecified type diabetes mellitus without mention of complication, uncontrolled 02/03/2013  . Ulcerative colitis, unspecified 02/03/2013  . Chronic diastolic CHF (congestive heart failure) ([place])   . NSTEMI (non-ST elevated myocardial infarction) [facility])    Social History   Social History  . Marital Status: Married    Spouse Name: N/A  . Number of Children: N/A  . Years of Education: N/A   Social History Main Topics  . Smoking status: Former Smoker -- 1.50 packs/day for 30 years    Quit date: 11/16/1995  . Smokeless tobacco: Never Used  . Alcohol Use: No  . Drug Use: No  . Sexual Activity: Not Asked   Other Topics Concern  . None   Social History Narrative   Family History  Problem Relation Age of Onset  . CAD Mother   . Diabetes Daughter   . Diabetes Daughter   . Diabetes Son    Scheduled Meds: . aspirin EC  81 mg Oral Daily  . atorvastatin  40 mg Oral q1800  . enoxaparin (LOVENOX) injection  40 mg Subcutaneous Q24H    . ferrous sulfate  325 mg Oral TID WC  . insulin aspart  0-9 Units Subcutaneous TID WC  . levalbuterol  1.25 mg Nebulization QID  . loratadine  10 mg Oral Daily  . metoprolol tartrate  12.5 mg Oral BID  . piperacillin-tazobactam (ZOSYN)  IV  3.375 g Intravenous Q8H  . polyethylene glycol  17 g Oral BID  . sodium chloride  2,000 mL Intravenous Once  . sulfaDIAZINE  1,000 mg Oral BID  . vancomycin  1,000 mg Intravenous Q24H  . vitamin B-12  100 mcg Oral Daily   Continuous Infusions: . sodium chloride 75 mL/hr at 08/10/15 0044   PRN Meds:.famotidine, guaiFENesin, nitroGLYCERIN, RESOURCE THICKENUP CLEAR Medications Prior to Admission:  Prior to Admission medications   Medication Sig Start Date End Date Taking? Authorizing Provider  acetaminophen (TYLENOL) 325 MG tablet Take 650 mg by mouth every 4 (four) hours as needed for moderate pain.    Yes Historical Provider, MD  aspirin EC 81 MG EC tablet Take 1 tablet (81 mg total) by mouth daily. 07/11/15  Yes Janece Canterbury, MD  atorvastatin (LIPITOR) 40 MG tablet Take 1 tablet (40 mg total) by mouth daily at 6 PM. 07/11/15  Yes Janece Canterbury, MD  cetirizine (ZYRTEC) 10 MG tablet Take 10 mg by mouth at bedtime.   Yes Historical Provider, MD  famotidine (PEPCID) 20 MG tablet Take 1 tablet (20 mg total) by mouth 2 (two) times daily as needed for heartburn or indigestion. 07/11/15  Yes Janece Canterbury, MD  ferrous sulfate 325 (65 FE) MG EC tablet Take 325 mg by mouth 3 (three) times daily with meals.   Yes Historical Provider, MD  guaiFENesin (MUCINEX) 600 MG 12 hr tablet Take 1 tablet (600 mg total) by mouth 2 (two) times daily as needed for cough or to loosen phlegm. 07/11/15  Yes Janece Canterbury, MD  linagliptin (TRADJENTA) 5 MG TABS tablet Take 1 tablet (5 mg total) by mouth daily. 03/18/15  Yes Lauree Chandler, NP  metFORMIN (GLUCOPHAGE) 500 MG tablet Take 1 tablet (500 mg total) by mouth 2 (two) times daily with a meal. Take one tablet by  mouth twice daily to control blood sugar 07/11/15  Yes Janece Canterbury, MD  metoprolol tartrate (LOPRESSOR) 25 MG tablet Take 12.5 mg by mouth 2 (two) times daily. Take 1/2 tablet by mouth daily for blood pressure. HOLD for SBP <110 11/24/11  Yes Lezlie Octave Black, NP  nitroGLYCERIN (NITROSTAT) 0.4 MG SL tablet Place 1 tablet (0.4 mg total) under the tongue every 5 (five) minutes as needed for chest pain. 07/11/15  Yes Janece Canterbury, MD  OXYGEN Inhale 3 L into the lungs continuous. To keep sats >90%   Yes Historical Provider, MD  polyethylene glycol (MIRALAX / GLYCOLAX) packet Take 17 g by mouth 2 (two) times daily. Mix powder in 4-8 oz of liquid once daily for constipation 06/01/15  Yes Lauree Chandler, NP  sulfaDIAZINE 500 MG tablet Take 1,000 mg by mouth 2 (two) times daily. Take two tablets by mouth twice daily with H2O for IBS   Yes Historical Provider, MD  vitamin B-12 100 MCG tablet Take 1 tablet (100 mcg total) by mouth daily. 09/11/12  Yes Monika Salk, MD  zinc oxide (BALMEX) 11.3 % CREA cream Apply 1 application topically 3 (three) times daily. Apply cream topically to groin and buttock every shift as preventative care   Yes Historical Provider, MD   Allergies  Allergen Reactions  . Contrast Media [Iodinated Diagnostic Agents] Other (See Comments)    Unknown    Review of Systems Positive for weakness, aspiration.   Physical Exam Weak elderly lady Coarse rhonchi congestion anterioyly S1 S2 Abdomen soft No edema Awake alert, some  Mild cognitive deficits, answers most questions appropriately.   Vital Signs: BP 116/60 mmHg  Pulse 98  Temp(Src) 97.8 F (36.6 C) (Oral)  Resp 18  Ht 5\' 6"  (1.676 m)  Wt 50.803 kg (112 lb)  BMI 18.09 kg/m2  SpO2 97%  SpO2: SpO2: 97 % O2 Device:SpO2: 97 % O2 Flow Rate: .O2 Flow Rate (L/min): 4 L/min  IO: Intake/output summary:  Intake/Output Summary (Last 24 hours) at 08/10/15 1630 Last data filed at 08/10/15 1500  Gross per 24 hour  Intake  1161.25 ml  Output      0 ml  Net 1161.25 ml    LBM: Last BM Date: 08/09/15 Baseline Weight: Weight: 54.432 kg (120 lb) Most recent weight: Weight: 50.803  kg (112 lb)      Palliative Assessment/Data:  Flowsheet Rows        Most Recent Value   Intake Tab    Referral Department  Hospitalist   Unit at Time of Referral  Med/Surg Unit   Palliative Care Primary Diagnosis  Pulmonary   Palliative Care Type  New Palliative care   Reason for referral  Non-pain Symptom, Clarify Goals of Care, Counsel Regarding Hospice   Date first seen by Palliative Care  08/10/15   Clinical Assessment    Palliative Performance Scale Score  30%   Pain Max last 24 hours  4   Pain Min Last 24 hours  3   Dyspnea Max Last 24 Hours  4   Dyspnea Min Last 24 hours  3   Psychosocial & Spiritual Assessment    Palliative Care Outcomes    Patient/Family meeting held?  Yes   Who was at the meeting?  2 daughters, patient, son in law    [place] goals of care, Counseled regarding hospice   Palliative Care follow-up planned  No      Additional Data Reviewed:  CBC:    Component Value Date/Time   WBC 10.2 08/09/2015 2207   WBC 11.9 07/20/2015   HGB 13.3 08/09/2015 2207   HCT 43.0 08/09/2015 2207   PLT 266 08/09/2015 2207   MCV 82.7 08/09/2015 2207   NEUTROABS 6.6 08/09/2015 2207   LYMPHSABS 2.6 08/09/2015 2207   MONOABS 0.9 08/09/2015 2207   EOSABS 0.1 08/09/2015 2207   BASOSABS 0.0 08/09/2015 2207   Comprehensive Metabolic Panel:    Component Value Date/Time   NA 145 08/09/2015 2207   NA 140 07/20/2015   K 4.4 08/09/2015 2207   CL 101 08/09/2015 2207   CO2 33* 08/09/2015 2207   BUN 14 08/09/2015 2207   BUN 18 07/20/2015   CREATININE 0.59 08/09/2015 2207   CREATININE 0.4* 07/20/2015   GLUCOSE 140* 08/09/2015 2207   CALCIUM 10.6* 08/09/2015 2207   AST 23 07/07/2015 1805   ALT 13 07/20/2015   ALKPHOS 56 07/20/2015   BILITOT 0.1* 07/07/2015 1805   PROT 7.6 07/07/2015  1805   ALBUMIN 3.5 07/07/2015 1805     Time In: 1500 Time Out: 1600 Time Total:  60 min  Greater than 50%  of this time was spent counseling and coordinating care related to the above assessment and plan.  Signed by: Loistine Chance, MD [id number] Loistine Chance, MD  08/10/2015, 4:30 PM  Please contact Palliative Medicine Team phone at [phone] for questions and concerns.

## 2015-08-10 NOTE — Progress Notes (Signed)
OT Cancellation Note  Patient Details Name: [name] MRN: [id number] DOB: Jan 29, 1935   Cancelled Treatment:    Reason Eval/Treat Not Completed: OT screened, no needs identified, will sign off  Talked with pt and at this time she is total care for all ADLS and self feeding secondary to quadriparesis.  Will defer OT eval at this time as pt is at her normal baseline with MS.   Arihana Ambrocio OTR/L 08/10/2015, 2:48 PM

## 2015-08-11 ENCOUNTER — Encounter: Payer: Self-pay | Admitting: Internal Medicine

## 2015-08-11 ENCOUNTER — Non-Acute Institutional Stay (SKILLED_NURSING_FACILITY): Payer: Medicare Other | Admitting: Internal Medicine

## 2015-08-11 DIAGNOSIS — I10 Essential (primary) hypertension: Secondary | ICD-10-CM

## 2015-08-11 DIAGNOSIS — E119 Type 2 diabetes mellitus without complications: Secondary | ICD-10-CM | POA: Diagnosis not present

## 2015-08-11 DIAGNOSIS — G35 Multiple sclerosis: Secondary | ICD-10-CM

## 2015-08-11 DIAGNOSIS — K51919 Ulcerative colitis, unspecified with unspecified complications: Secondary | ICD-10-CM

## 2015-08-11 DIAGNOSIS — J9601 Acute respiratory failure with hypoxia: Secondary | ICD-10-CM | POA: Diagnosis not present

## 2015-08-11 DIAGNOSIS — R627 Adult failure to thrive: Secondary | ICD-10-CM

## 2015-08-11 DIAGNOSIS — D509 Iron deficiency anemia, unspecified: Secondary | ICD-10-CM

## 2015-08-11 DIAGNOSIS — E785 Hyperlipidemia, unspecified: Secondary | ICD-10-CM | POA: Diagnosis not present

## 2015-08-11 DIAGNOSIS — K5900 Constipation, unspecified: Secondary | ICD-10-CM | POA: Diagnosis not present

## 2015-08-11 DIAGNOSIS — R1310 Dysphagia, unspecified: Secondary | ICD-10-CM | POA: Diagnosis not present

## 2015-08-11 DIAGNOSIS — J189 Pneumonia, unspecified organism: Secondary | ICD-10-CM

## 2015-08-11 DIAGNOSIS — E46 Unspecified protein-calorie malnutrition: Secondary | ICD-10-CM | POA: Diagnosis not present

## 2015-08-11 LAB — URINE CULTURE: Culture: NO GROWTH

## 2015-08-11 NOTE — Progress Notes (Signed)
Patient ID: [name], female   DOB: 1935-02-02, 81 y.o.   MRN: [id number]     Facility: [facility]    PCP: Blanchie Serve, MD  Code Status: DNR/MOST form  Allergies  Allergen Reactions  . Contrast Media [Iodinated Diagnostic Agents] Other (See Comments)    Unknown    Chief Complaint  Patient presents with  . Readmit To SNF    Readmission      HPI:  81 y.o. patient is here for long term care post hospital admission from 08/09/15-08/10/15 with acute respiratory failure with hypoxia from pneumonia. She was seen by SLP team and palliative care team and decision was made for comfort care. She is now off all antibiotics. She is seen in her room today, she is on oxygen and denies any concerns. She is on dysphagia diet.   Review of Systems:  Constitutional: Negative for fever, chills, diaphoresis.  HENT: Negative for headache, congestion, nasal discharge Eyes: Negative for blurred vision, double vision and discharge.  Respiratory: positive for cough. Negative for shortness of breath and wheezing.   Cardiovascular: Negative for chest pain, palpitations, leg swelling.  Gastrointestinal: Negative for heartburn, nausea, vomiting, abdominal pain. Genitourinary: Negative for dysuria and flank pain.  Skin: Negative for rash.  Neurological: Negative for dizziness Psychiatric/Behavioral: Negative for depression   Past Medical History  Diagnosis Date  . Hypertension   . Diabetes mellitus   . GERD (gastroesophageal reflux disease)   . Neuromuscular disorder ([place])   . Multiple sclerosis ([place])   . Anxiety   . Hyperlipemia   . Pneumonia   . Colitis   . Unable to ambulate   . GI bleed 09/07/2012  . Type II or unspecified type diabetes mellitus without mention of complication, uncontrolled 02/03/2013  . Ulcerative colitis, unspecified 02/03/2013  . Chronic diastolic CHF (congestive heart failure) ([place])   . NSTEMI (non-ST elevated myocardial infarction) [facility])      Past Surgical History  Procedure Laterality Date  . Tubal ligation    . Flexible sigmoidoscopy  10/05/2011    Procedure: FLEXIBLE SIGMOIDOSCOPY;  Surgeon: Winfield Cunas., MD;  Location: [facility];  Service: Endoscopy;  Laterality: N/A;   pt. coming from [place] daughter phone no. [phone] might come with her  sedation is needed, pt. is paraplegic  . Esophagogastroduodenoscopy N/A 09/09/2012    Procedure: ESOPHAGOGASTRODUODENOSCOPY (EGD);  Surgeon: Winfield Cunas., MD;  Location: [facility] ENDOSCOPY;  Service: Endoscopy;  Laterality: N/A;  . Colonoscopy N/A 09/09/2012    Procedure: COLONOSCOPY;  Surgeon: Winfield Cunas., MD;  Location: [facility] ENDOSCOPY;  Service: Endoscopy;  Laterality: N/A;  possible flex sig   Social History:   reports that she quit smoking about 19 years ago. She has never used smokeless tobacco. She reports that she does not drink alcohol or use illicit drugs.  Family History  Problem Relation Age of Onset  . CAD Mother   . Diabetes Daughter   . Diabetes Daughter   . Diabetes Son     Medications:   Medication List       This list is accurate as of: 08/11/15  1:56 PM.  Always use your most recent med list.               acetaminophen 325 MG tablet  Commonly known as:  TYLENOL  Take 650 mg by mouth every 4 (four) hours as needed for moderate pain.     aspirin 81 MG EC tablet  Take 1 tablet (81 mg total) by mouth daily.     atorvastatin 40 MG tablet  Commonly known as:  LIPITOR  Take 1 tablet (40 mg total) by mouth daily at 6 PM.     cetirizine 10 MG tablet  Commonly known as:  ZYRTEC  Take 10 mg by mouth at bedtime.     cyanocobalamin 100 MCG tablet  Take 1 tablet (100 mcg total) by mouth daily.     famotidine 20 MG tablet  Commonly known as:  PEPCID  Take 1 tablet (20 mg total) by mouth 2 (two) times daily as needed for heartburn or indigestion.     ferrous sulfate 325 (65 FE) MG EC tablet  Take 325 mg by  mouth 3 (three) times daily with meals.     guaiFENesin 600 MG 12 hr tablet  Commonly known as:  MUCINEX  Take 1 tablet (600 mg total) by mouth 2 (two) times daily as needed for cough or to loosen phlegm.     linagliptin 5 MG Tabs tablet  Commonly known as:  TRADJENTA  Take 1 tablet (5 mg total) by mouth daily.     metFORMIN 500 MG tablet  Commonly known as:  GLUCOPHAGE  Take 1 tablet (500 mg total) by mouth 2 (two) times daily with a meal. Take one tablet by mouth twice daily to control blood sugar     metoprolol tartrate 25 MG tablet  Commonly known as:  LOPRESSOR  Take 12.5 mg by mouth 2 (two) times daily. Take 1/2 tablet by mouth daily for blood pressure. HOLD for SBP <110     nitroGLYCERIN 0.4 MG SL tablet  Commonly known as:  NITROSTAT  Place 1 tablet (0.4 mg total) under the tongue every 5 (five) minutes as needed for chest pain.     OXYGEN  Inhale 3 L into the lungs continuous. To keep sats >90%     polyethylene glycol packet  Commonly known as:  MIRALAX / GLYCOLAX  Take 17 g by mouth 2 (two) times daily. Mix powder in 4-8 oz of liquid once daily for constipation     sulfaDIAZINE 500 MG tablet  Take 1,000 mg by mouth 2 (two) times daily. Take two tablets by mouth twice daily with H2O for IBS     zinc oxide 11.3 % Crea cream  Commonly known as:  BALMEX  Apply 1 application topically 3 (three) times daily. Apply cream topically to groin and buttock every shift as preventative care         Physical Exam: Filed Vitals:   08/11/15 0941  BP: 133/71  Pulse: 99  Temp: 97.3 F (36.3 C)  TempSrc: Oral  Resp: 18  SpO2: 92%    General- elderly frail female in no acute distress Head- atraumatic, normocephalic Eyes- PERRLA, EOMI, no pallor, no icterus, no discharge Neck- no lymphadenopathy, no JVD Mouth- normal mucus membrane, has dentures Cardiovascular- normal s1,s2, no murmurs, normal distal pulses, trace edema Respiratory- bilateral clear to auscultation, no  wheeze, no rhonchi, no crackles, on o2 Abdomen- bowel sounds present, soft, non tender Musculoskeletal- quadriplegia, restricted ROM at neck area, no cervical spine tenderness, contracture in her feet,  no leg edema Neurological- alert and oriented Skin- warm and dry Psychiatry- normal mood and affect   Labs reviewed: Basic Metabolic Panel:  Recent Labs  07/10/15 0430 07/11/15 0556 07/20/15 08/09/15 2207  NA 143 142 140 145  K 5.2* 3.4*  --  4.4  CL 110 107  --  101  CO2 19* 28  --  33*  GLUCOSE 92 121*  --  140*  BUN 6 <5* 18 14  CREATININE 0.40* 0.36* 0.4* 0.59  CALCIUM 9.3 8.9  --  10.6*   Liver Function Tests:  Recent Labs  07/07/15 1805 07/20/15  AST 23  --   ALT 11* 13  ALKPHOS 63 56  BILITOT 0.1*  --   PROT 7.6  --   ALBUMIN 3.5  --    No results for input(s): LIPASE, AMYLASE in the last 8760 hours. No results for input(s): AMMONIA in the last 8760 hours. CBC:  Recent Labs  07/07/15 1805  07/10/15 0700 07/11/15 0556 07/20/15 07/22/15 08/09/15 2207  WBC 8.7  < > 5.3 4.8 11.9 11.4 10.2  NEUTROABS 5.2  --   --   --   --   --  6.6  HGB 14.1  < > 10.7* 10.7* 14.8 13.8 13.3  HCT 49.5*  < > 37.0 36.5 52* 47* 43.0  MCV 83.1  < > 80.6 80.4  --   --  82.7  PLT PLATELET CLUMPS NOTED ON SMEAR, COUNT APPEARS ADEQUATE  < > 283 262 384 386 266  < > = values in this interval not displayed. Cardiac Enzymes:  Recent Labs  08/10/15 0052 08/10/15 0548 08/10/15 1211  TROPONINI <0.03 <0.03 <0.03   BNP: Invalid input(s): POCBNP CBG:  Recent Labs  08/10/15 0600 08/10/15 1119 08/10/15 1629  GLUCAP 113* 141* 108*    Radiological Exams: Dg Chest 2 View  08/09/2015  CLINICAL DATA:  Hypoxia EXAM: CHEST  2 VIEW COMPARISON:  07/07/2015 FINDINGS: Low lung volumes with bibasilar opacity, partially bandlike on lateral view. Chronic cardiomegaly. Stable aortic contours. Negative for pulmonary edema. IMPRESSION: Low volume chest with bibasilar atelectasis. Superimposed  pneumonia or aspiration would be obscured. Electronically Signed   By: Monte Fantasia M.D.   On: 08/09/2015 21:46    Assessment/Plan  Acute respiratory failure In setting of HCAP. Improved, off antibiotics at present, continue o2 and wean as tolerated. High risk for recurrent aspiration pneumonia. Will have her follow with SLP.   Failure to thrive With ongoing weight loss, poor appetite, recurrent hospital admission with pneumonia and deconditioning. Goal is for comfort care. Get palliative care and hospice consult. Reviewed her DNR and MOST form  Protein calorie malnutrition With ongoing weight loss. Get dietary consult. Patient would like an appetite stimulant. Start remeron 7.5 mg daily for now  HCAP Off antibiotics. Afebrile and back to her baseline. Monitor clinically, continue pulmonary toileting. Continue prn mucinex  Hyperlipidemia Lipid Panel     Component Value Date/Time   CHOL 154 07/08/2015 0720   TRIG 144 07/08/2015 0720   HDL 24* 07/08/2015 0720   CHOLHDL 6.4 07/08/2015 0720   VLDL 29 07/08/2015 0720   LDLCALC 101* 07/08/2015 0720  discintinue lipitor for now with her goals of care being comfort care  Iron deficiency anemia Hb stable, discontinue iron supplement for now. Does not want further blood work.   Dysphagia Continue aspiration precaution and dysphagia diet. Continue prn famotidine  DM type 2 Lab Results  Component Value Date   HGBA1C 6.0* 07/08/2015  Monitor cbg, with a1c suggestive of controlled DM, discontinue tradjenta and metformin  HTN Stable, monitor bp, continue lopressor 12.5 mg daily, check VS daily  MS With quadriplegia, under total care, fall precautions, air mattress and pressure ulcer prophylaxis  Constipation Continue miralax daily  Ulcerative colitis Continue sulfadiazine   Goals of care:  long term care   Labs/tests ordered: none  Family/ staff Communication: reviewed care plan with patient and nursing  supervisor    Blanchie Serve, MD  [facility] Adult [facility] [phone] (Monday-Friday 8 am - 5 pm) [phone] (afterhours)

## 2015-08-15 LAB — BASIC METABOLIC PANEL
BUN: 14 mg/dL (ref 4–21)
Glucose: 105 mg/dL
POTASSIUM: 4.2 mmol/L (ref 3.4–5.3)
SODIUM: 140 mmol/L (ref 137–147)

## 2015-08-15 LAB — CULTURE, BLOOD (ROUTINE X 2)
CULTURE: NO GROWTH
CULTURE: NO GROWTH

## 2015-08-17 ENCOUNTER — Encounter: Payer: Self-pay | Admitting: Family

## 2015-08-17 ENCOUNTER — Non-Acute Institutional Stay (SKILLED_NURSING_FACILITY): Payer: Medicare Other | Admitting: Family

## 2015-08-17 DIAGNOSIS — R0902 Hypoxemia: Secondary | ICD-10-CM | POA: Diagnosis not present

## 2015-08-17 NOTE — Progress Notes (Signed)
Patient ID: [name], female   DOB: 30-Oct-1934, 81 y.o.   MRN: [id number]  Location: [facility]  Provider: Blanchie Serve, MD  Code Status:  DNR/MOST form  Goals of care: Advanced Directive information Advanced Directives 08/17/2015  Does patient have an advance directive? Yes  Type of Advance Directive Out of facility DNR (pink MOST or yellow form)  Does patient want to make changes to advanced directive? No - Patient declined  Copy of advanced directive(s) in chart? Yes     Chief Complaint  Patient presents with  . Acute Visit    Oxygen level    HPI:  Pt is a 81 y.o. female seen today at [facility] for acute issues.She has a past medical History of HTN, GERD, MS, Type 2 DM,CHF, Anxiety among others. She is seen today in her room in the bed watching TV.  Facility staff reports that patient's oxygen saturations over the weekend 08/15/2015 was down to 79% on 3 Liters Nasal cannula. On call Provider ordered CXR and increase oxygen to 4 liters Nasal Cannula. Patient denies any chills, fever, dyspnea, SOB, Orthopnea , wheezing or chest pain.Her oxygen saturation are up to 95% on 4 Liters N/C.CXR showed right basilar atelectasis encouraged to use IS up to 10 times per day for two weeks by on Call provider. Patient continues with Palliative Care for comfort.  Review of Systems  Constitutional: Negative for fever and chills.  HENT: Negative.   Eyes: Negative.   Respiratory: Negative for cough, shortness of breath and wheezing.   Cardiovascular: Negative for chest pain, palpitations, orthopnea and leg swelling.  Gastrointestinal: Negative.   Genitourinary: Negative.   Skin: Negative.   Neurological: Negative.   Psychiatric/Behavioral: Negative.     Past Medical History  Diagnosis Date  . Hypertension   . Diabetes mellitus   . GERD (gastroesophageal reflux disease)   . Neuromuscular disorder ([place])   . Multiple sclerosis ([place])   .  Anxiety   . Hyperlipemia   . Pneumonia   . Colitis   . Unable to ambulate   . GI bleed 09/07/2012  . Type II or unspecified type diabetes mellitus without mention of complication, uncontrolled 02/03/2013  . Ulcerative colitis, unspecified 02/03/2013  . Chronic diastolic CHF (congestive heart failure) ([place])   . NSTEMI (non-ST elevated myocardial infarction) [facility])    Past Surgical History  Procedure Laterality Date  . Tubal ligation    . Flexible sigmoidoscopy  10/05/2011    Procedure: FLEXIBLE SIGMOIDOSCOPY;  Surgeon: Winfield Cunas., MD;  Location: [facility];  Service: Endoscopy;  Laterality: N/A;   pt. coming from [place] daughter phone no. [phone] might come with her  sedation is needed, pt. is paraplegic  . Esophagogastroduodenoscopy N/A 09/09/2012    Procedure: ESOPHAGOGASTRODUODENOSCOPY (EGD);  Surgeon: Winfield Cunas., MD;  Location: [facility] ENDOSCOPY;  Service: Endoscopy;  Laterality: N/A;  . Colonoscopy N/A 09/09/2012    Procedure: COLONOSCOPY;  Surgeon: Winfield Cunas., MD;  Location: [facility] ENDOSCOPY;  Service: Endoscopy;  Laterality: N/A;  possible flex sig    Allergies  Allergen Reactions  . Contrast Media [Iodinated Diagnostic Agents] Other (See Comments)    Unknown      Medication List       This list is accurate as of: 08/17/15  6:32 PM.  Always use your most recent med list.               acetaminophen 325  MG tablet  Commonly known as:  TYLENOL  Take 650 mg by mouth every 4 (four) hours as needed for moderate pain.     aspirin 81 MG EC tablet  Take 1 tablet (81 mg total) by mouth daily.     cetirizine 10 MG tablet  Commonly known as:  ZYRTEC  Take 10 mg by mouth at bedtime.     cyanocobalamin 100 MCG tablet  Take 1 tablet (100 mcg total) by mouth daily.     famotidine 20 MG tablet  Commonly known as:  PEPCID  Take 1 tablet (20 mg total) by mouth 2 (two) times daily as needed for heartburn or indigestion.      gabapentin 100 MG capsule  Commonly known as:  NEURONTIN  Take 100 mg by mouth at bedtime.     guaiFENesin 600 MG 12 hr tablet  Commonly known as:  MUCINEX  Take 1 tablet (600 mg total) by mouth 2 (two) times daily as needed for cough or to loosen phlegm.     metFORMIN 500 MG tablet  Commonly known as:  GLUCOPHAGE  Take 1 tablet (500 mg total) by mouth 2 (two) times daily with a meal. Take one tablet by mouth twice daily to control blood sugar     metoprolol tartrate 25 MG tablet  Commonly known as:  LOPRESSOR  12.5 mg. Take 1 tablet by mouth daily for blood pressure. HOLD for SBP <110     mirtazapine 7.5 MG tablet  Commonly known as:  REMERON  Take 7.5 mg by mouth daily.     nitroGLYCERIN 0.4 MG SL tablet  Commonly known as:  NITROSTAT  Place 1 tablet (0.4 mg total) under the tongue every 5 (five) minutes as needed for chest pain.     OXYGEN  Inhale 3 L into the lungs continuous. To keep sats >90%     polyethylene glycol packet  Commonly known as:  MIRALAX / GLYCOLAX  Take 17 g by mouth 2 (two) times daily. Mix powder in 4-8 oz of liquid once daily for constipation     sulfaDIAZINE 500 MG tablet  Take 1,000 mg by mouth 2 (two) times daily. Take two tablets by mouth twice daily with H2O for IBS     zinc oxide 11.3 % Crea cream  Commonly known as:  BALMEX  Apply 1 application topically 3 (three) times daily. Apply cream topically to groin and buttock every shift as preventative care        Immunization History  Administered Date(s) Administered  . Influenza-Unspecified 04/15/2014, 04/22/2015  . PPD Test 03/28/2014, 03/18/2015  . Pneumococcal-Unspecified 04/24/2012   Pertinent  Health Maintenance Due  Topic Date Due  . OPHTHALMOLOGY EXAM  09/15/2015  . FOOT EXAM  11/11/2015  . HEMOGLOBIN A1C  01/06/2016  . INFLUENZA VACCINE  02/09/2016  . DEXA SCAN  Addressed  . PNA vac Low Risk Adult  Addressed   Fall Risk  09/17/2014 05/12/2014  Falls in the past year? Exclusion  - non ambulatory No  Risk for fall due to : Impaired mobility -    Filed Vitals:   08/17/15 1557  BP: 116/68  Pulse: 109  Temp: 96.8 F (36 C)  Resp: 19  Height: 5\' 6"  (1.676 m)  Weight: 115 lb 12.8 oz (52.527 kg)  SpO2: 93%   Body mass index is 18.7 kg/(m^2). Physical Exam  Constitutional: She is oriented to person, place, and time. She appears well-developed.  Frail pleasant Elderly in no acute distress  HENT:  Head: Normocephalic.  Right Ear: External ear normal.  Left Ear: External ear normal.  Mouth/Throat: Oropharynx is clear and moist.  Eyes: EOM are normal. Pupils are equal, round, and reactive to light. Right eye exhibits no discharge. Left eye exhibits no discharge. No scleral icterus.  Neck:  Limited ROM to neck   Cardiovascular: Normal rate, regular rhythm, normal heart sounds and intact distal pulses.   Pulmonary/Chest: Effort normal and breath sounds normal. No respiratory distress. She has no wheezes. She has no rales.  Abdominal: Soft. Bowel sounds are normal. She exhibits no distension and no mass. There is no tenderness. There is no guarding.  Musculoskeletal: She exhibits no edema or tenderness.  Quadraplegia, Bilateral UE's and LE's contractures noted  Lymphadenopathy:    She has no cervical adenopathy.  Neurological: She is oriented to person, place, and time.  Skin: Skin is warm and dry. No rash noted. No erythema. No pallor.  Psychiatric: She has a normal mood and affect.    Labs reviewed:  Recent Labs  07/10/15 0430 07/11/15 0556 07/20/15 08/09/15 2207  NA 143 142 140 145  K 5.2* 3.4*  --  4.4  CL 110 107  --  101  CO2 19* 28  --  33*  GLUCOSE 92 121*  --  140*  BUN 6 <5* 18 14  CREATININE 0.40* 0.36* 0.4* 0.59  CALCIUM 9.3 8.9  --  10.6*    Recent Labs  07/07/15 1805 07/20/15  AST 23  --   ALT 11* 13  ALKPHOS 63 56  BILITOT 0.1*  --   PROT 7.6  --   ALBUMIN 3.5  --     Recent Labs  07/07/15 1805  07/10/15 0700  07/11/15 0556 07/20/15 07/22/15 08/09/15 2207  WBC 8.7  < > 5.3 4.8 11.9 11.4 10.2  NEUTROABS 5.2  --   --   --   --   --  6.6  HGB 14.1  < > 10.7* 10.7* 14.8 13.8 13.3  HCT 49.5*  < > 37.0 36.5 52* 47* 43.0  MCV 83.1  < > 80.6 80.4  --   --  82.7  PLT PLATELET CLUMPS NOTED ON SMEAR, COUNT APPEARS ADEQUATE  < > 283 262 384 386 266  < > = values in this interval not displayed. Lab Results  Component Value Date   TSH 0.56 09/17/2014   Lab Results  Component Value Date   HGBA1C 6.0* 07/08/2015   Lab Results  Component Value Date   CHOL 154 07/08/2015   HDL 24* 07/08/2015   LDLCALC 101* 07/08/2015   TRIG 144 07/08/2015   CHOLHDL 6.4 07/08/2015    Significant Diagnostic Results in last 30 days:  Dg Chest 2 View  08/09/2015  CLINICAL DATA:  Hypoxia EXAM: CHEST  2 VIEW COMPARISON:  07/07/2015 FINDINGS: Low lung volumes with bibasilar opacity, partially bandlike on lateral view. Chronic cardiomegaly. Stable aortic contours. Negative for pulmonary edema. IMPRESSION: Low volume chest with bibasilar atelectasis. Superimposed pneumonia or aspiration would be obscured. Electronically Signed   By: Monte Fantasia M.D.   On: 08/09/2015 21:46    Assessment/Plan  Oxygen desaturation Asymptomatic today. Desaturation over the weekend 08/15/2015 was down to 79% on 3 Liters Nasal cannula. On call Provider ordered CXR and increase oxygen to 4 liters Nasal Cannula.Her oxygen saturation are up to 95% on 4 Liters N/C.CXR showed right basilar atelectasis encouraged to use IS up to 10 times per day for two weeks by on  Call provider.Will continue on IS and monitor. Continue to follow up with Pallative care.         Family/ staff Communication:Reviewed plan of care with Patient and facility staff.   Labs/tests ordered: None

## 2015-08-21 NOTE — Progress Notes (Signed)
Admission/Discharge Note:  Pt transferred to: (return) to [facility] Anticipated date of transfer: 08/10/15 Transported by: Ambulance I(PTAR) Time Tentatively Scheduled for: 3:0o PM Family notified: Daughter Maudie Mercury Report # given to nursing to call report.  DC summary sent to facility. CSW spoke with admissions at [facility]- patient has been in the hospital overnight and wants to return back to the facility as soon as possible.  MD is agreeable to same.  No further CSW needs identified.    CSW signing off.  Kendell Bane, LCSW [phone]

## 2015-09-07 ENCOUNTER — Non-Acute Institutional Stay (SKILLED_NURSING_FACILITY): Payer: Medicare Other | Admitting: Family

## 2015-09-07 DIAGNOSIS — I10 Essential (primary) hypertension: Secondary | ICD-10-CM

## 2015-09-07 DIAGNOSIS — E119 Type 2 diabetes mellitus without complications: Secondary | ICD-10-CM | POA: Diagnosis not present

## 2015-09-07 DIAGNOSIS — K5901 Slow transit constipation: Secondary | ICD-10-CM

## 2015-09-07 DIAGNOSIS — K219 Gastro-esophageal reflux disease without esophagitis: Secondary | ICD-10-CM | POA: Diagnosis not present

## 2015-09-07 DIAGNOSIS — I5032 Chronic diastolic (congestive) heart failure: Secondary | ICD-10-CM | POA: Diagnosis not present

## 2015-09-07 NOTE — Progress Notes (Signed)
Patient ID: [name], female   DOB: January 20, 1935, 81 y.o.   MRN: [id number]  Location:  [place]:  SNF (31) Provider: Blanchie Serve, MD  Patient Care Team: Blanchie Serve, MD as PCP - General (Internal Medicine) Gerlene Fee, NP as Nurse Practitioner (Nurse Practitioner)  Extended Emergency Contact Information Primary Emergency Contact: Clapp,Kim Address: [address]          [place], [place] [address] [place] Phone: [phone] Relation: Daughter Secondary Emergency Contact: Fanny Dance, [place] [address] [place] Phone: [phone] Mobile Phone: [phone] Relation: Relative  Code Status:  Full code/MOST Forms  Goals of care: Advanced Directive information Advanced Directives 08/17/2015  Does patient have an advance directive? Yes  Type of Advance Directive Out of facility DNR (pink MOST or yellow form)  Does patient want to make changes to advanced directive? No - Patient declined  Copy of advanced directive(s) in chart? Yes     Chief Complaint  Patient presents with  . Medical Management of Chronic Issues    HPI:  Pt is a 81 y.o. female seen today at [facility] for medical management of chronic diseases. She has a medical history of HTN, GERD, Type 2 DM, Anxiety , MS amongst others. She is seen in her room today  in her bed watching TV. She denies any acute issues this visit. Previous coughing has resolved.Current CGB's in the morning ranges in the 80's -170's. Facility staff report no acute concerns.      Past Medical History  Diagnosis Date  . Hypertension   . Diabetes mellitus   . GERD (gastroesophageal reflux disease)   . Neuromuscular disorder ([place])   . Multiple sclerosis ([place])   . Anxiety   . Hyperlipemia   . Pneumonia   . Colitis   . Unable to ambulate   . GI bleed 09/07/2012  . Type II or unspecified type diabetes mellitus without  mention of complication, uncontrolled 02/03/2013  . Ulcerative colitis, unspecified 02/03/2013  . Chronic diastolic CHF (congestive heart failure) ([place])   . NSTEMI (non-ST elevated myocardial infarction) [facility])    Past Surgical History  Procedure Laterality Date  . Tubal ligation    . Flexible sigmoidoscopy  10/05/2011    Procedure: FLEXIBLE SIGMOIDOSCOPY;  Surgeon: Winfield Cunas., MD;  Location: [facility] ENDOS[facility];  Service: Endoscopy;  Laterality: N/A;   pt. coming from [place] daughter phone no. [phone] might come with her  sedation is needed, pt. is paraplegic  . Esophagogastroduodenoscopy N/A 09/09/2012    Procedure: ESOPHAGOGASTRODUODENOSCOPY (EGD);  Surgeon: Winfield Cunas., MD;  Location: [facility] ENDOSCOPY;  Service: Endoscopy;  Laterality: N/A;  . Colonoscopy N/A 09/09/2012    Procedure: COLONOSCOPY;  Surgeon: Winfield Cunas., MD;  Location: [facility] ENDOSCOPY;  Service: Endoscopy;  Laterality: N/A;  possible flex sig    Allergies  Allergen Reactions  . Contrast Media [Iodinated Diagnostic Agents] Other (See Comments)    Unknown      Medication List       This list is accurate as of: 09/07/15  6:30 PM.  Always use your most recent med list.               acetaminophen 325 MG tablet  Commonly known as:  TYLENOL  Take 650 mg by mouth every 4 (four) hours as needed for  moderate pain.     aspirin 81 MG EC tablet  Take 1 tablet (81 mg total) by mouth daily.     cetirizine 10 MG tablet  Commonly known as:  ZYRTEC  Take 10 mg by mouth at bedtime.     cyanocobalamin 100 MCG tablet  Take 1 tablet (100 mcg total) by mouth daily.     famotidine 20 MG tablet  Commonly known as:  PEPCID  Take 1 tablet (20 mg total) by mouth 2 (two) times daily as needed for heartburn or indigestion.     gabapentin 100 MG capsule  Commonly known as:  NEURONTIN  Take 100 mg by mouth at bedtime.     guaiFENesin 600 MG 12 hr tablet  Commonly known as:  MUCINEX    Take 1 tablet (600 mg total) by mouth 2 (two) times daily as needed for cough or to loosen phlegm.     ipratropium-albuterol 0.5-2.5 (3) MG/3ML Soln  Commonly known as:  DUONEB  Take 3 mLs by nebulization every 6 (six) hours.     metFORMIN 500 MG tablet  Commonly known as:  GLUCOPHAGE  Take 1 tablet (500 mg total) by mouth 2 (two) times daily with a meal. Take one tablet by mouth twice daily to control blood sugar     metoprolol tartrate 25 MG tablet  Commonly known as:  LOPRESSOR  12.5 mg. Take 1 tablet by mouth daily for blood pressure. HOLD for SBP <110     mirtazapine 7.5 MG tablet  Commonly known as:  REMERON  Take 7.5 mg by mouth daily.     nitroGLYCERIN 0.4 MG SL tablet  Commonly known as:  NITROSTAT  Place 1 tablet (0.4 mg total) under the tongue every 5 (five) minutes as needed for chest pain.     OXYGEN  Inhale 4 L into the lungs continuous. To keep sats >90%     polyethylene glycol packet  Commonly known as:  MIRALAX / GLYCOLAX  Take 17 g by mouth 2 (two) times daily. Mix powder in 4-8 oz of liquid once daily for constipation     sulfaDIAZINE 500 MG tablet  Take 1,000 mg by mouth 2 (two) times daily. Take two tablets by mouth twice daily with H2O for IBS     zinc oxide 11.3 % Crea cream  Commonly known as:  BALMEX  Apply 1 application topically 3 (three) times daily. Apply cream topically to groin and buttock every shift as preventative care        Review of Systems  Constitutional: Negative for fever, chills, appetite change and fatigue.  HENT: Positive for congestion. Negative for drooling, sinus pressure, sneezing, sore throat and trouble swallowing.   Eyes: Negative.   Respiratory: Negative for cough, chest tightness, shortness of breath and wheezing.   Cardiovascular: Negative for chest pain, palpitations and leg swelling.  Gastrointestinal: Negative for nausea, vomiting, abdominal pain, diarrhea, constipation and abdominal distention.  Endocrine:  Negative.   Genitourinary: Negative.   Musculoskeletal:       Quadriplegic   Allergic/Immunologic: Negative.   Neurological: Negative.   Hematological: Negative.   Psychiatric/Behavioral: Negative.     Immunization History  Administered Date(s) Administered  . Influenza-Unspecified 04/15/2014, 04/22/2015  . PPD Test 03/28/2014, 03/18/2015  . Pneumococcal-Unspecified 04/24/2012   Pertinent  Health Maintenance Due  Topic Date Due  . OPHTHALMOLOGY EXAM  09/15/2015  . FOOT EXAM  11/11/2015  . HEMOGLOBIN A1C  01/06/2016  . INFLUENZA VACCINE  02/09/2016  . DEXA  SCAN  Addressed  . PNA vac Low Risk Adult  Addressed   Fall Risk  09/17/2014 05/12/2014  Falls in the past year? Exclusion - non ambulatory No  Risk for fall due to : Impaired mobility -   Functional Status Survey:    Filed Vitals:   09/07/15 1817  BP: 102/50  Pulse: 72  Temp: 96.1 F (35.6 C)  Resp: 20  SpO2: 98%   There is no weight on file to calculate BMI. Physical Exam  Constitutional:  Frail Elderly in no acute distress.   HENT:  Head: Normocephalic and atraumatic.  Mouth/Throat: Oropharynx is clear and moist.  Eyes: Conjunctivae and EOM are normal. Pupils are equal, round, and reactive to light. Right eye exhibits no discharge. Left eye exhibits discharge. No scleral icterus.  Neck: Normal range of motion.  Cardiovascular: Normal rate, regular rhythm, normal heart sounds and intact distal pulses.  Exam reveals no gallop and no friction rub.   No murmur heard. Pulmonary/Chest: Effort normal and breath sounds normal. No respiratory distress. She has no wheezes. She has no rales.  Abdominal: Soft. Bowel sounds are normal. She exhibits no distension and no mass. There is no tenderness. There is no rebound and no guarding.  Musculoskeletal: She exhibits no edema or tenderness.  Bilateral UE's contractures. Quadraplegic   Lymphadenopathy:    She has no cervical adenopathy.  Neurological: She is alert.  Skin:  Skin is warm and dry. No rash noted. No erythema. No pallor.  Psychiatric: She has a normal mood and affect.    Labs reviewed:  Recent Labs  07/10/15 0430 07/11/15 0556 07/20/15 08/09/15 2207  NA 143 142 140 145  K 5.2* 3.4*  --  4.4  CL 110 107  --  101  CO2 19* 28  --  33*  GLUCOSE 92 121*  --  140*  BUN 6 <5* 18 14  CREATININE 0.40* 0.36* 0.4* 0.59  CALCIUM 9.3 8.9  --  10.6*    Recent Labs  07/07/15 1805 07/20/15  AST 23  --   ALT 11* 13  ALKPHOS 63 56  BILITOT 0.1*  --   PROT 7.6  --   ALBUMIN 3.5  --     Recent Labs  07/07/15 1805  07/10/15 0700 07/11/15 0556 07/20/15 07/22/15 08/09/15 2207  WBC 8.7  < > 5.3 4.8 11.9 11.4 10.2  NEUTROABS 5.2  --   --   --   --   --  6.6  HGB 14.1  < > 10.7* 10.7* 14.8 13.8 13.3  HCT 49.5*  < > 37.0 36.5 52* 47* 43.0  MCV 83.1  < > 80.6 80.4  --   --  82.7  PLT PLATELET CLUMPS NOTED ON SMEAR, COUNT APPEARS ADEQUATE  < > 283 262 384 386 266  < > = values in this interval not displayed. Lab Results  Component Value Date   TSH 0.56 09/17/2014   Lab Results  Component Value Date   HGBA1C 6.0* 07/08/2015   Lab Results  Component Value Date   CHOL 154 07/08/2015   HDL 24* 07/08/2015   LDLCALC 101* 07/08/2015   TRIG 144 07/08/2015   CHOLHDL 6.4 07/08/2015    Significant Diagnostic Results in last 30 days:  Dg Chest 2 View  08/09/2015  CLINICAL DATA:  Hypoxia EXAM: CHEST  2 VIEW COMPARISON:  07/07/2015 FINDINGS: Low lung volumes with bibasilar opacity, partially bandlike on lateral view. Chronic cardiomegaly. Stable aortic contours. Negative for pulmonary edema.  IMPRESSION: Low volume chest with bibasilar atelectasis. Superimposed pneumonia or aspiration would be obscured. Electronically Signed   By: Monte Fantasia M.D.   On: 08/09/2015 21:46    Assessment/Plan 1. Chronic diastolic CHF (congestive heart failure) (HCC) Stable. No recent weight gain, edema or shortness of breath.continue on Metoprolol   2. Essential  hypertension, benign B/p stable.continue on Metoprolol. Monitor BMP   3. Gastroesophageal reflux disease without esophagitis Asymptomatic on Famotidine twice daily.Continue to monitor.   4. Slow transit constipation Current regimen effective. Continue on Miralax   5. Controlled type 2 diabetes mellitus without complication, without long-term current use of insulin (HCC) CBG's stable on Metformin 500 mg Tablet twice daily. Monitor Hgb A1C     Family/ staff Communication: Reviewed plan of care with patient and facility Nurse.   Labs/tests ordered: None

## 2015-09-30 ENCOUNTER — Encounter: Payer: Self-pay | Admitting: Family

## 2015-09-30 ENCOUNTER — Non-Acute Institutional Stay (SKILLED_NURSING_FACILITY): Payer: Medicare Other | Admitting: Family

## 2015-09-30 DIAGNOSIS — R509 Fever, unspecified: Secondary | ICD-10-CM | POA: Diagnosis not present

## 2015-09-30 DIAGNOSIS — R000 Tachycardia, unspecified: Secondary | ICD-10-CM

## 2015-09-30 DIAGNOSIS — R05 Cough: Secondary | ICD-10-CM

## 2015-09-30 DIAGNOSIS — J9601 Acute respiratory failure with hypoxia: Secondary | ICD-10-CM

## 2015-09-30 DIAGNOSIS — R059 Cough, unspecified: Secondary | ICD-10-CM

## 2015-09-30 LAB — CBC AND DIFFERENTIAL
HEMATOCRIT: 34 % — AB (ref 36–46)
Hemoglobin: 10.8 g/dL — AB (ref 12.0–16.0)
Platelets: 254 10*3/uL (ref 150–399)
WBC: 16.4 10^3/mL

## 2015-09-30 LAB — BASIC METABOLIC PANEL
BUN: 34 mg/dL — AB (ref 4–21)
CREATININE: 0.6 mg/dL (ref <=1.1)
Glucose: 344 mg/dL
POTASSIUM: 4 mmol/L (ref 3.4–5.3)
SODIUM: 138 mmol/L (ref 137–147)

## 2015-09-30 NOTE — Progress Notes (Signed)
Location:  [place] Room Number: [id number] Place of Service:  SNF (31) Provider: Joen Laura, MD  Patient Care Team: Blanchie Serve, MD as PCP - General (Internal Medicine) Gerlene Fee, NP as Nurse Practitioner (Nurse Practitioner)  Extended Emergency Contact Information Primary Emergency Contact: Clapp,Kim Address: [address]          [place], [place] [address] [place] Phone: [phone] Relation: Daughter Secondary Emergency Contact: Fanny Dance, [place] [address] [place] of [place] Phone: [phone] Mobile Phone: [phone] Relation: Relative  Code Status: Full Code  Goals of care: Advanced Directive information Advanced Directives 09/30/2015  Does patient have an advance directive? Yes  Type of Advance Directive Out of facility DNR (pink MOST or yellow form)  Does patient want to make changes to advanced directive? No - Patient declined  Copy of advanced directive(s) in chart? Yes     No chief complaint on file.   HPI:  Pt is a 81 y.o. female seen today at [facility] for an acute visit for cough and congestion. She is seen in her room today. She complains of productive cough states coughing up thick yellow sputum. She has had chills and fever. Facility Nurse has given robitussin for cough. Patient's Temp 102.2, HR 132, B/p 90/60 with decreased oxygen saturation concerning for respiratory failure and sepsis. Discussed with patient and Patient 's daughter  options to send to ER for evaluation but patient refused.she agrees for CXR and antibiotics to be initiated. Patient's daughter will talk with the rest of the family members concerning code status and possible palliative/Hospice service.     Past Medical History  Diagnosis Date  . Hypertension   . Diabetes mellitus   . GERD (gastroesophageal reflux disease)   . Neuromuscular disorder ([place])   .  Multiple sclerosis ([place])   . Anxiety   . Hyperlipemia   . Pneumonia   . Colitis   . Unable to ambulate   . GI bleed 09/07/2012  . Type II or unspecified type diabetes mellitus without mention of complication, uncontrolled 02/03/2013  . Ulcerative colitis, unspecified 02/03/2013  . Chronic diastolic CHF (congestive heart failure) ([place])   . NSTEMI (non-ST elevated myocardial infarction) [facility])    Past Surgical History  Procedure Laterality Date  . Tubal ligation    . Flexible sigmoidoscopy  10/05/2011    Procedure: FLEXIBLE SIGMOIDOSCOPY;  Surgeon: Winfield Cunas., MD;  Location: [facility];  Service: Endoscopy;  Laterality: N/A;   pt. coming from [place] daughter phone no. [phone] might come with her  sedation is needed, pt. is paraplegic  . Esophagogastroduodenoscopy N/A 09/09/2012    Procedure: ESOPHAGOGASTRODUODENOSCOPY (EGD);  Surgeon: Winfield Cunas., MD;  Location: [facility] ENDOSCOPY;  Service: Endoscopy;  Laterality: N/A;  . Colonoscopy N/A 09/09/2012    Procedure: COLONOSCOPY;  Surgeon: Winfield Cunas., MD;  Location: [facility] ENDOSCOPY;  Service: Endoscopy;  Laterality: N/A;  possible flex sig    Allergies  Allergen Reactions  . Contrast Media [Iodinated Diagnostic Agents] Other (See Comments)    Unknown      Medication List       This list is accurate as of: 09/30/15 11:01 AM.  Always use your most recent med list.               acetaminophen 325 MG tablet  Commonly known  as:  TYLENOL  Take 650 mg by mouth every 4 (four) hours as needed for moderate pain.     aspirin 81 MG EC tablet  Take 1 tablet (81 mg total) by mouth daily.     cetirizine 10 MG tablet  Commonly known as:  ZYRTEC  Take 10 mg by mouth at bedtime.     cyanocobalamin 100 MCG tablet  Take 1 tablet (100 mcg total) by mouth daily.     famotidine 20 MG tablet  Commonly known as:  PEPCID  Take 1 tablet (20 mg total) by mouth 2 (two) times daily as needed for  heartburn or indigestion.     gabapentin 100 MG capsule  Commonly known as:  NEURONTIN  Take 100 mg by mouth at bedtime.     guaiFENesin 600 MG 12 hr tablet  Commonly known as:  MUCINEX  Take 1 tablet (600 mg total) by mouth 2 (two) times daily as needed for cough or to loosen phlegm.     ipratropium-albuterol 0.5-2.5 (3) MG/3ML Soln  Commonly known as:  DUONEB  Take 3 mLs by nebulization every 6 (six) hours.     metFORMIN 500 MG tablet  Commonly known as:  GLUCOPHAGE  Take 1 tablet (500 mg total) by mouth 2 (two) times daily with a meal. Take one tablet by mouth twice daily to control blood sugar     metoprolol tartrate 25 MG tablet  Commonly known as:  LOPRESSOR  12.5 mg. Take 1 tablet by mouth daily for blood pressure. HOLD for SBP <110     mirtazapine 15 MG tablet  Commonly known as:  REMERON  Take 15 mg by mouth at bedtime.     nitroGLYCERIN 0.4 MG SL tablet  Commonly known as:  NITROSTAT  Place 1 tablet (0.4 mg total) under the tongue every 5 (five) minutes as needed for chest pain.     OXYGEN  Inhale 4 L into the lungs continuous. To keep sats >90%     polyethylene glycol packet  Commonly known as:  MIRALAX / GLYCOLAX  Take 17 g by mouth 2 (two) times daily. Mix powder in 4-8 oz of liquid once daily for constipation     sulfaDIAZINE 500 MG tablet  Take 1,000 mg by mouth 2 (two) times daily. Take two tablets by mouth twice daily with H2O for IBS     zinc oxide 11.3 % Crea cream  Commonly known as:  BALMEX  Apply 1 application topically 3 (three) times daily. Apply cream topically to groin and buttock every shift as preventative care        Review of Systems  Constitutional: Positive for fever, chills and appetite change. Negative for activity change and fatigue.  HENT: Positive for sore throat. Negative for congestion, rhinorrhea and sneezing.   Eyes: Negative.   Respiratory: Positive for cough and shortness of breath. Negative for chest tightness and  wheezing.        Congestion   Cardiovascular: Negative for chest pain, palpitations and leg swelling.  Gastrointestinal: Negative for nausea, vomiting, abdominal pain, diarrhea, constipation and abdominal distention.  Endocrine: Negative.   Genitourinary: Negative for dysuria, urgency, frequency and flank pain.  Skin: Negative.   Neurological: Negative for dizziness, seizures, light-headedness, numbness and headaches.  Psychiatric/Behavioral: Negative for hallucinations, confusion, sleep disturbance and agitation. The patient is not nervous/anxious.     Immunization History  Administered Date(s) Administered  . Influenza-Unspecified 04/15/2014, 04/22/2015  . PPD Test 03/28/2014, 03/18/2015  . Pneumococcal-Unspecified 04/24/2012  Pertinent  Health Maintenance Due  Topic Date Due  . OPHTHALMOLOGY EXAM  09/15/2015  . FOOT EXAM  11/11/2015  . HEMOGLOBIN A1C  01/06/2016  . INFLUENZA VACCINE  02/09/2016  . DEXA SCAN  Addressed  . PNA vac Low Risk Adult  Addressed   Fall Risk  09/17/2014 05/12/2014  Falls in the past year? Exclusion - non ambulatory No  Risk for fall due to : Impaired mobility -   Functional Status Survey:    Filed Vitals:   09/30/15 1008  BP: 90/50  Pulse: 132  Temp: 102.2 F (39 C)  TempSrc: Oral  Resp: 26  Height: 5\' 6"  (1.676 m)  Weight: 115 lb 12.8 oz (52.527 kg)  SpO2: 85%   Body mass index is 18.7 kg/(m^2). Physical Exam  Constitutional: She is oriented to person, place, and time.  Thin , frail Elderly. Labored respiration   HENT:  Head: Normocephalic.  Mouth/Throat: Oropharynx is clear and moist.  Eyes: Conjunctivae and EOM are normal. Pupils are equal, round, and reactive to light. Right eye exhibits no discharge. Left eye exhibits no discharge. No scleral icterus.  Neck: Neck supple. No JVD present. No thyromegaly present.  Cardiovascular: Normal heart sounds and intact distal pulses.  Exam reveals no friction rub.   No murmur  heard. Tachycardia   Pulmonary/Chest: She is in respiratory distress. She has rales.  Abdominal: Soft. Bowel sounds are normal. She exhibits no distension. There is no tenderness. There is no rebound and no guarding.  Musculoskeletal: She exhibits no edema or tenderness.  Lymphadenopathy:    She has no cervical adenopathy.  Neurological: She is oriented to person, place, and time.  Skin: Skin is warm and dry. No rash noted. No erythema. No pallor.  Psychiatric: She has a normal mood and affect.    Labs reviewed:  Recent Labs  07/10/15 0430 07/11/15 0556 07/20/15 08/09/15 2207 08/15/15  NA 143 142 140 145 140  K 5.2* 3.4*  --  4.4 4.2  CL 110 107  --  101  --   CO2 19* 28  --  33*  --   GLUCOSE 92 121*  --  140*  --   BUN 6 <5* 18 14 14   CREATININE 0.40* 0.36* 0.4* 0.59  --   CALCIUM 9.3 8.9  --  10.6*  --     Recent Labs  07/07/15 1805 07/20/15  AST 23  --   ALT 11* 13  ALKPHOS 63 56  BILITOT 0.1*  --   PROT 7.6  --   ALBUMIN 3.5  --     Recent Labs  07/07/15 1805  07/10/15 0700 07/11/15 0556 07/20/15 07/22/15 08/09/15 2207  WBC 8.7  < > 5.3 4.8 11.9 11.4 10.2  NEUTROABS 5.2  --   --   --   --   --  6.6  HGB 14.1  < > 10.7* 10.7* 14.8 13.8 13.3  HCT 49.5*  < > 37.0 36.5 52* 47* 43.0  MCV 83.1  < > 80.6 80.4  --   --  82.7  PLT PLATELET CLUMPS NOTED ON SMEAR, COUNT APPEARS ADEQUATE  < > 283 262 384 386 266  < > = values in this interval not displayed. Lab Results  Component Value Date   TSH 0.56 09/17/2014   Lab Results  Component Value Date   HGBA1C 6.0* 07/08/2015   Lab Results  Component Value Date   CHOL 154 07/08/2015   HDL 24* 07/08/2015   [place]  101* 07/08/2015   TRIG 144 07/08/2015   CHOLHDL 6.4 07/08/2015    Significant Diagnostic Results in last 30 days:  No results found.  Assessment/Plan Cough  Cough and congestion. Continue Robitussin PRN. Obtain portable CXR 2 views due to immobility r/o PNA . Start Doxycycline 100 mg tablet one  by mouth twice daily X 10 days .Florastor 250 mg capsule twice daily x 10 days for ABX assoc. Diarrhea prevention.   Hypoxia  Refused referral to ER. Oxygen saturation ranges 72-78 % increase oxygen to 5 liters Nasal cannula oxygen went upto 85 % .Duoneb Q 6 hrs X 3 days then PRN   Fever Temp 102.2 Tylenol 325 mg tablet two by mouth Q 4 Hrs. ABXs iniated for possible PNA . Obtain U/A and C/S r/o UTI.   Tachycardia  Patient refused referral to ER for evaluation. Will continue to monitor. ABX initiated for possible PNA. Will obtain U/A and C/S .     Family/ staff Communication: Reviewed plan with patient and patient's daughter who does not wish to be referred to ER for evaluation. Reviewed plan with facility Nurse supervisor.  Labs/tests ordered: portable CXR 2 views due to immobility r/o PNA.  U/A and C/S.   Spend > 40 minutes planning and coordinating care with patient, patient's daughter and facility Nurse supervisor.

## 2015-10-01 ENCOUNTER — Other Ambulatory Visit: Payer: Self-pay

## 2015-10-01 MED ORDER — LORAZEPAM 0.5 MG PO TABS
0.5000 mg | ORAL_TABLET | Freq: Four times a day (QID) | ORAL | Status: AC | PRN
Start: 2015-10-01 — End: ?

## 2015-10-01 NOTE — Telephone Encounter (Signed)
[facility]  [address] [place] [address]  Phone: [phone]  Fax: [phone]  

## 2015-10-05 ENCOUNTER — Other Ambulatory Visit: Payer: Self-pay

## 2015-10-05 MED ORDER — LORAZEPAM 1 MG PO TABS: ORAL_TABLET | ORAL | Status: AC

## 2015-10-05 MED ORDER — MORPHINE SULFATE 20 MG/5ML PO SOLN: ORAL | Status: AC

## 2015-10-05 NOTE — Telephone Encounter (Signed)
Rx faxed to [facility] @ [phone], phone number [phone]  

## 2015-10-07 ENCOUNTER — Non-Acute Institutional Stay (SKILLED_NURSING_FACILITY): Payer: Medicare Other | Admitting: Family

## 2015-10-07 DIAGNOSIS — R5381 Other malaise: Secondary | ICD-10-CM | POA: Diagnosis not present

## 2015-10-07 DIAGNOSIS — K219 Gastro-esophageal reflux disease without esophagitis: Secondary | ICD-10-CM | POA: Diagnosis not present

## 2015-10-07 DIAGNOSIS — I10 Essential (primary) hypertension: Secondary | ICD-10-CM | POA: Diagnosis not present

## 2015-10-07 DIAGNOSIS — E1121 Type 2 diabetes mellitus with diabetic nephropathy: Secondary | ICD-10-CM | POA: Diagnosis not present

## 2015-10-07 DIAGNOSIS — G35 Multiple sclerosis: Secondary | ICD-10-CM | POA: Diagnosis not present

## 2015-10-07 NOTE — Progress Notes (Signed)
Location:  [place] Room Number: 301-APlace of Service:  SNF (31) Provider:  Blanchie Serve, MD  Patient Care Team: Blanchie Serve, MD as PCP - General (Internal Medicine) Gerlene Fee, NP as Nurse Practitioner (Nurse Practitioner)  Extended Emergency Contact Information Primary Emergency Contact: Clapp,Kim Address: [address]          [place], [place] [address] [place] Phone: [phone] Relation: Daughter Secondary Emergency Contact: Fanny Dance,  [address] [place] of [place] Phone: [phone] Mobile Phone: [phone] Relation: Relative  Code Status:  DNR Goals of care: Advanced Directive information Advanced Directives 10/07/2015  Does patient have an advance directive? Yes  Type of Advance Directive Out of facility DNR (pink MOST or yellow form)  Does patient want to make changes to advanced directive? No - Patient declined  Copy of advanced directive(s) in chart? Yes     Chief Complaint  Patient presents with  . Medical Management of Chronic Issues    HPI:  Pt is a 81 y.o. female seen today at  [facility] medical management of chronic diseases. She has a medical history of Multiple sclerosis, Type 2 DM, GERD, Anxiety, CHF among others. She is seen in her room today with her daughter and granddaughter at bedside.She was recently treated for pneumonia 09/30/2015 Patient and daughter did not want hospital referral. Patient conditioned has progressively declined and Hospice consulted. Family and hospice request all medication discontinued patient unable to take oral intake. Roxanol initiated by Hospice provider for pain and shortness of breath.    Past Medical History  Diagnosis Date  . Hypertension   . Diabetes mellitus   . GERD (gastroesophageal reflux disease)   . Neuromuscular disorder ([place])   . Multiple sclerosis ([place])   . Anxiety   .  Hyperlipemia   . Pneumonia   . Colitis   . Unable to ambulate   . GI bleed 09/07/2012  . Type II or unspecified type diabetes mellitus without mention of complication, uncontrolled 02/03/2013  . Ulcerative colitis, unspecified 02/03/2013  . Chronic diastolic CHF (congestive heart failure) ([place])   . NSTEMI (non-ST elevated myocardial infarction) [facility])    Past Surgical History  Procedure Laterality Date  . Tubal ligation    . Flexible sigmoidoscopy  10/05/2011    Procedure: FLEXIBLE SIGMOIDOSCOPY;  Surgeon: Winfield Cunas., MD;  Location: [facility];  Service: Endoscopy;  Laterality: N/A;   pt. coming from [place] daughter phone no. [phone] might come with her  sedation is needed, pt. is paraplegic  . Esophagogastroduodenoscopy N/A 09/09/2012    Procedure: ESOPHAGOGASTRODUODENOSCOPY (EGD);  Surgeon: Winfield Cunas., MD;  Location: [facility] ENDOSCOPY;  Service: Endoscopy;  Laterality: N/A;  . Colonoscopy N/A 09/09/2012    Procedure: COLONOSCOPY;  Surgeon: Winfield Cunas., MD;  Location: [facility] ENDOSCOPY;  Service: Endoscopy;  Laterality: N/A;  possible flex sig    Allergies  Allergen Reactions  . Contrast Media [Iodinated Diagnostic Agents] Other (See Comments)    Unknown      Medication List       This list is accurate as of: 10/07/15 11:17 AM.  Always use your most recent med list.               acetaminophen 325 MG tablet  Commonly known as:  TYLENOL  Take 650 mg by mouth every 4 (four) hours as needed  for moderate pain.     aspirin 81 MG EC tablet  Take 1 tablet (81 mg total) by mouth daily.     cetirizine 10 MG tablet  Commonly known as:  ZYRTEC  Take 10 mg by mouth at bedtime.     cyanocobalamin 100 MCG tablet  Take 1 tablet (100 mcg total) by mouth daily.     famotidine 20 MG tablet  Commonly known as:  PEPCID  Take 1 tablet (20 mg total) by mouth 2 (two) times daily as needed for heartburn or indigestion.     gabapentin 100 MG  capsule  Commonly known as:  NEURONTIN  Take 100 mg by mouth at bedtime. For neuropathy     guaiFENesin 600 MG 12 hr tablet  Commonly known as:  MUCINEX  Take 1 tablet (600 mg total) by mouth 2 (two) times daily as needed for cough or to loosen phlegm.     ipratropium-albuterol 0.5-2.5 (3) MG/3ML Soln  Commonly known as:  DUONEB  Take 3 mLs by nebulization every 6 (six) hours.     LORazepam 0.5 MG tablet  Commonly known as:  ATIVAN  Place 1 tablet (0.5 mg total) under the tongue every 6 (six) hours as needed for anxiety.     LORazepam 1 MG tablet  Commonly known as:  ATIVAN  Take 1 tablet by mouth twice daily scheduled Take 1 tablet by mouth every 4 hours as needed for anxiety     metFORMIN 500 MG tablet  Commonly known as:  GLUCOPHAGE  Take 1 tablet (500 mg total) by mouth 2 (two) times daily with a meal. Take one tablet by mouth twice daily to control blood sugar     metoprolol tartrate 25 MG tablet  Commonly known as:  LOPRESSOR  12.5 mg. Take 1 tablet by mouth daily for blood pressure. HOLD for SBP <110     mirtazapine 15 MG tablet  Commonly known as:  REMERON  Take by mouth daily. Give a 1/2 tablet by mouth daily for appetite stimulation.     morphine 20 MG/5ML solution  Give 0.25 mL = 5 mg by mouth every 2 hours as needed for pain or shortness of breath DOUBLE CHECK CALIBRATED SYRINGE     nitroGLYCERIN 0.4 MG SL tablet  Commonly known as:  NITROSTAT  Place 1 tablet (0.4 mg total) under the tongue every 5 (five) minutes as needed for chest pain.     OXYGEN  Inhale 4 L into the lungs continuous. To keep sats >90%     polyethylene glycol packet  Commonly known as:  MIRALAX / GLYCOLAX  Take 17 g by mouth 2 (two) times daily. Mix powder in 4-8 oz of liquid once daily for constipation     sulfaDIAZINE 500 MG tablet  Take 1,000 mg by mouth 2 (two) times daily. Take two tablets by mouth twice daily with H2O for IBS     zinc oxide 11.3 % Crea cream  Commonly known as:   BALMEX  Apply 1 application topically 3 (three) times daily. Apply cream topically to groin and buttock every shift as preventative care        Review of Systems  Unable to perform ROS: Acuity of condition    Immunization History  Administered Date(s) Administered  . Influenza-Unspecified 04/15/2014, 04/22/2015  . PPD Test 03/28/2014, 03/18/2015  . Pneumococcal-Unspecified 04/24/2012   Pertinent  Health Maintenance Due  Topic Date Due  . OPHTHALMOLOGY EXAM  09/15/2015  . FOOT EXAM  11/11/2015  . HEMOGLOBIN A1C  01/06/2016  . INFLUENZA VACCINE  02/09/2016  . DEXA SCAN  Addressed  . PNA vac Low Risk Adult  Addressed   Fall Risk  09/17/2014 05/12/2014  Falls in the past year? Exclusion - non ambulatory No  Risk for fall due to : Impaired mobility -   Functional Status Survey:    Filed Vitals:   10/07/15 0857  BP: 125/66  Pulse: 68  Temp: 97.7 F (36.5 C)  TempSrc: Oral  Resp: 20  Height: 5\' 6"  (1.676 m)  Weight: 109 lb 6.4 oz (49.624 kg)  SpO2: 95%   Body mass index is 17.67 kg/(m^2). Physical Exam  Constitutional:  Frail Elderly with labored respiration .Opens eyes to verbal command.  HENT:  Head: Normocephalic.  Mouth dryness   Eyes: Conjunctivae are normal. Pupils are equal, round, and reactive to light. Right eye exhibits no discharge. Left eye exhibits no discharge. No scleral icterus.  Neck: No JVD present.  Cardiovascular: Normal rate, regular rhythm, normal heart sounds and intact distal pulses.  Exam reveals no gallop and no friction rub.   No murmur heard. Pulmonary/Chest:  Bilateral labored respiration with accessory muscle use. Crackles through out to auscultation.   Abdominal: Soft. Bowel sounds are normal. She exhibits no distension.  Musculoskeletal: She exhibits no edema or tenderness.  Lymphadenopathy:    She has no cervical adenopathy.  Neurological:  Opens eyes to verbal command but non-verbal. Quadraplegic  Skin: Skin is warm and dry. No  rash noted. No erythema.  Psychiatric:  Non-verbal     Labs reviewed:  Recent Labs  07/10/15 0430 07/11/15 0556 07/20/15 08/09/15 2207 08/15/15 09/30/15  NA 143 142 140 145 140 138  K 5.2* 3.4*  --  4.4 4.2 4.0  CL 110 107  --  101  --   --   CO2 19* 28  --  33*  --   --   GLUCOSE 92 121*  --  140*  --   --   BUN 6 <5* 18 14 14  34*  CREATININE 0.40* 0.36* 0.4* 0.59  --  0.6  CALCIUM 9.3 8.9  --  10.6*  --   --     Recent Labs  07/07/15 1805 07/20/15  AST 23  --   ALT 11* 13  ALKPHOS 63 56  BILITOT 0.1*  --   PROT 7.6  --   ALBUMIN 3.5  --     Recent Labs  07/07/15 1805  07/10/15 0700 07/11/15 0556  07/22/15 08/09/15 2207 09/30/15  WBC 8.7  < > 5.3 4.8  < > 11.4 10.2 16.4  NEUTROABS 5.2  --   --   --   --   --  6.6  --   HGB 14.1  < > 10.7* 10.7*  < > 13.8 13.3 10.8*  HCT 49.5*  < > 37.0 36.5  < > 47* 43.0 34*  MCV 83.1  < > 80.6 80.4  --   --  82.7  --   PLT PLATELET CLUMPS NOTED ON SMEAR, COUNT APPEARS ADEQUATE  < > 283 262  < > 386 266 254  < > = values in this interval not displayed. Lab Results  Component Value Date   TSH 0.56 09/17/2014   Lab Results  Component Value Date   HGBA1C 6.0* 07/08/2015   Lab Results  Component Value Date   CHOL 154 07/08/2015   HDL 24* 07/08/2015   LDLCALC 101* 07/08/2015   TRIG  144 07/08/2015   CHOLHDL 6.4 07/08/2015    Significant Diagnostic Results in last 30 days:  No results found.  Assessment/Plan Physical deconditioning: Progressive decline in condition. Non-verbal.Currently on Hospice service.  HTN B/p stable. D/c medication per family and hospice request unable to take medication.  Type 2 DM Discontinue CBG's for comfort care. Discontinue Metformin per family and hospice request unable to take medication orally.  GERD Discontinue pepcid per family and hospice request unable to take medication.  Multiple Sclerosis: Quadrplegic. Progressive decline in condition. Currently on hospice service. Continue  with Roxanol for pain and shortness of breath.Oral care every 4 hrs and PRN    Family/ staff Communication: Reviewed plan of care with patient's daughter and facility Nurse.   Labs/tests ordered:  None

## 2015-10-10 DEATH — deceased
